# Patient Record
Sex: Male | Born: 1990 | Race: Black or African American | Hispanic: No | Marital: Married | Smoking: Current some day smoker
Health system: Southern US, Community
[De-identification: ages and names within clinical notes are randomized; demographics above are authoritative.]

## PROBLEM LIST (undated history)

## (undated) DIAGNOSIS — I219 Acute myocardial infarction, unspecified: Secondary | ICD-10-CM

---
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
## 2009-02-05 ENCOUNTER — Emergency Department (HOSPITAL_COMMUNITY): Admission: EM | Admit: 2009-02-05 | Discharge: 2009-02-05 | Payer: Self-pay | Admitting: Emergency Medicine

## 2009-08-17 ENCOUNTER — Emergency Department (HOSPITAL_COMMUNITY): Admission: EM | Admit: 2009-08-17 | Discharge: 2009-08-17 | Payer: Self-pay | Admitting: Emergency Medicine

## 2010-11-06 LAB — POCT URINALYSIS DIP (DEVICE)
Bilirubin Urine: NEGATIVE
Glucose, UA: NEGATIVE mg/dL
Hgb urine dipstick: NEGATIVE
Ketones, ur: NEGATIVE mg/dL
Nitrite: NEGATIVE
Protein, ur: 30 mg/dL — AB
Specific Gravity, Urine: 1.02 (ref 1.005–1.030)
Urobilinogen, UA: 1 mg/dL (ref 0.0–1.0)
pH: 7 (ref 5.0–8.0)

## 2010-11-06 LAB — GC/CHLAMYDIA PROBE AMP, GENITAL
Chlamydia, DNA Probe: NEGATIVE
GC Probe Amp, Genital: NEGATIVE

## 2011-02-20 ENCOUNTER — Inpatient Hospital Stay (INDEPENDENT_AMBULATORY_CARE_PROVIDER_SITE_OTHER)
Admission: RE | Admit: 2011-02-20 | Discharge: 2011-02-20 | Disposition: A | Discharge status: Home / Self Care | Payer: Self-pay | Source: Ambulatory Visit | Attending: Emergency Medicine | Admitting: Emergency Medicine

## 2011-02-20 DIAGNOSIS — J039 Acute tonsillitis, unspecified: Secondary | ICD-10-CM

## 2011-02-20 LAB — DIFFERENTIAL
Basophils Absolute: 0.4 10*3/uL — ABNORMAL HIGH (ref 0.0–0.1)
Lymphocytes Relative: 62 % — ABNORMAL HIGH (ref 12–46)
Monocytes Relative: 7 % (ref 3–12)
Neutro Abs: 3.6 10*3/uL (ref 1.7–7.7)
Neutrophils Relative %: 28 % — ABNORMAL LOW (ref 43–77)

## 2011-02-20 LAB — POCT INFECTIOUS MONO SCREEN: Mono Screen: POSITIVE — AB

## 2011-02-20 LAB — CBC
Hemoglobin: 17.2 g/dL — ABNORMAL HIGH (ref 13.0–17.0)
MCH: 28.1 pg (ref 26.0–34.0)
Platelets: 262 10*3/uL (ref 150–400)
RBC: 6.13 MIL/uL — ABNORMAL HIGH (ref 4.22–5.81)
WBC: 13 10*3/uL — ABNORMAL HIGH (ref 4.0–10.5)

## 2011-02-20 LAB — POCT RAPID STREP A: Streptococcus, Group A Screen (Direct): NEGATIVE

## 2011-02-21 LAB — PATHOLOGIST SMEAR REVIEW: Tech Review: REACTIVE

## 2012-03-16 ENCOUNTER — Emergency Department (INDEPENDENT_AMBULATORY_CARE_PROVIDER_SITE_OTHER)
Admission: EM | Admit: 2012-03-16 | Discharge: 2012-03-16 | Disposition: A | Discharge status: Home / Self Care | Payer: Self-pay | Source: Home / Self Care | Attending: Emergency Medicine | Admitting: Emergency Medicine

## 2012-03-16 ENCOUNTER — Encounter (HOSPITAL_COMMUNITY): Payer: Self-pay | Admitting: *Deleted

## 2012-03-16 DIAGNOSIS — M5430 Sciatica, unspecified side: Secondary | ICD-10-CM

## 2012-03-16 LAB — POCT URINALYSIS DIP (DEVICE)
Bilirubin Urine: NEGATIVE
Glucose, UA: NEGATIVE mg/dL
Ketones, ur: NEGATIVE mg/dL
Leukocytes, UA: NEGATIVE
Protein, ur: NEGATIVE mg/dL
Specific Gravity, Urine: 1.02 (ref 1.005–1.030)

## 2012-03-16 MED ORDER — MELOXICAM 15 MG PO TABS: 15.0000 mg | ORAL_TABLET | Freq: Every day | ORAL | Status: AC

## 2012-03-16 MED ORDER — METHOCARBAMOL 500 MG PO TABS: 500.0000 mg | ORAL_TABLET | Freq: Three times a day (TID) | ORAL | Status: AC

## 2012-03-16 MED ORDER — PREDNISONE 5 MG PO KIT: 1.0000 | PACK | Freq: Every day | ORAL | Status: DC

## 2012-03-16 MED ORDER — TRAMADOL HCL 50 MG PO TABS: 100.0000 mg | ORAL_TABLET | Freq: Three times a day (TID) | ORAL | Status: AC | PRN

## 2012-03-16 MED ORDER — KETOROLAC TROMETHAMINE 60 MG/2ML IM SOLN
INTRAMUSCULAR | Status: AC
  Filled 2012-03-16: qty 2

## 2012-03-16 MED ORDER — KETOROLAC TROMETHAMINE 60 MG/2ML IM SOLN
60.0000 mg | Freq: Once | INTRAMUSCULAR | Status: AC
  Administered 2012-03-16: 60 mg via INTRAMUSCULAR

## 2012-03-16 NOTE — ED Provider Notes (Signed)
Chief Complaint  Patient presents with  . Back Pain    History of Present Illness:   Patient is a 21 year old male who has had a history since yesterday at 3 PM of right lower back pain. The pain began while he was just sitting and playing cards. He denies any injury. The pain was initially localized to the right SI area and radiates toward the midline and down the back of the leg as far as the knee but not below the knee. He denies any numbness, tingling, or paresthesias. There is no muscle weakness or difficulty walking. No difficulty with urination or bowel movements. He denies fever, chills, sweats, headache, abdominal pain, or vomiting. He denies a prior history of lower back problems.  Review of Systems:  Other than noted above, the patient denies any of the following symptoms: Systemic:  No fever, chills, fatigue, or weight loss. GI:  No abdominal pain, nausea, vomiting, diarrhea, constipation or blood in stool. GU:  No dysuria, frequency, urgency, or hematuria. No incontinence or difficulty urinating.  M-S:  No neck pain, joint pain, arthritis, or myalgias. Neuro:  No parethesias or muscular weakness. Skin:  No rash or itching.   PMFSH:  Past medical history, family history, social history, meds, and allergies were reviewed.  Physical Exam:   Vital signs:  BP 130/65  Pulse 88  Temp 98 F (36.7 C) (Oral)  Resp 16  SpO2 100% General:  Alert, oriented, in no distress. Abdomen:  Soft, non-tender.  No organomegaly or mass.  No pulsatile midline abdominal mass or bruit. Back:  He localizes the pain to the right SI area however there is no pain to palpation in this area or over the rest of the back. The back is very limited range of motion with 20 of flexion, 10 extension, 10 of lateral bending to the right, 20 lateral bending to the left, and and 20 of rotation in each direction with pain and muscle spasm. Straight leg raising is negative on the left and also negative on the right but  he has a positive Lasegue's sign on the right but negative popliteal compression sign. Neuro:  Normal muscle strength, sensations and DTRs. Extremities: Pedal pulses were full, there was no edema. Skin:  Clear, warm and dry.  No rash.  Labs:   Results for orders placed during the hospital encounter of 03/16/12  POCT URINALYSIS DIP (DEVICE)      Component Value Range   Glucose, UA NEGATIVE  NEGATIVE mg/dL   Bilirubin Urine NEGATIVE  NEGATIVE   Ketones, ur NEGATIVE  NEGATIVE mg/dL   Specific Gravity, Urine 1.020  1.005 - 1.030   Hgb urine dipstick NEGATIVE  NEGATIVE   pH 6.0  5.0 - 8.0   Protein, ur NEGATIVE  NEGATIVE mg/dL   Urobilinogen, UA 1.0  0.0 - 1.0 mg/dL   Nitrite NEGATIVE  NEGATIVE   Leukocytes, UA NEGATIVE  NEGATIVE    Course in Urgent Care Center:   The patient was given Toradol 60 mg IM and tolerated this well without any immediate side effects.  Assessment:  The encounter diagnosis was Sciatica.  Plan:   1.  The following meds were prescribed:   New Prescriptions   MELOXICAM (MOBIC) 15 MG TABLET    Take 1 tablet (15 mg total) by mouth daily.   METHOCARBAMOL (ROBAXIN) 500 MG TABLET    Take 1 tablet (500 mg total) by mouth 3 (three) times daily.   PREDNISONE 5 MG KIT  Take 1 kit (5 mg total) by mouth daily after breakfast. Prednisone 5 mg 6 day dosepack.  Take as directed.   TRAMADOL (ULTRAM) 50 MG TABLET    Take 2 tablets (100 mg total) by mouth every 8 (eight) hours as needed for pain.   2.  The patient was instructed in symptomatic care and handouts were given. 3.  The patient was told to return if becoming worse in any way, if no better in 2 weeks, and given some red flag symptoms that would indicate earlier return. 4.  The patient was encouraged to try to be as active as possible and given some exercises to do followed by moist heat.  Follow up:  The patient was told to follow up with Dr. August Saucer if no better in 2 weeks.     Reuben Likes, MD 03/16/12 2012

## 2012-03-16 NOTE — ED Notes (Signed)
Co lower mid back pain since yesterday, denies recent injury or urinary symptoms.

## 2013-08-12 ENCOUNTER — Emergency Department (INDEPENDENT_AMBULATORY_CARE_PROVIDER_SITE_OTHER)
Admission: EM | Admit: 2013-08-12 | Discharge: 2013-08-12 | Disposition: A | Discharge status: Home / Self Care | Payer: Self-pay | Source: Home / Self Care | Attending: Family Medicine | Admitting: Family Medicine

## 2013-08-12 ENCOUNTER — Encounter (HOSPITAL_COMMUNITY): Payer: Self-pay | Admitting: Emergency Medicine

## 2013-08-12 DIAGNOSIS — J020 Streptococcal pharyngitis: Secondary | ICD-10-CM

## 2013-08-12 LAB — POCT RAPID STREP A: Streptococcus, Group A Screen (Direct): POSITIVE — AB

## 2013-08-12 MED ORDER — KETOROLAC TROMETHAMINE 60 MG/2ML IM SOLN
INTRAMUSCULAR | Status: AC
  Filled 2013-08-12: qty 2

## 2013-08-12 MED ORDER — PENICILLIN G BENZATHINE 1200000 UNIT/2ML IM SUSP
INTRAMUSCULAR | Status: AC
  Filled 2013-08-12: qty 2

## 2013-08-12 MED ORDER — PENICILLIN G BENZATHINE 1200000 UNIT/2ML IM SUSP
1.2000 10*6.[IU] | Freq: Once | INTRAMUSCULAR | Status: AC
  Administered 2013-08-12: 1.2 10*6.[IU] via INTRAMUSCULAR

## 2013-08-12 MED ORDER — AMOXICILLIN 875 MG PO TABS: 875.0000 mg | ORAL_TABLET | Freq: Two times a day (BID) | ORAL | Status: DC

## 2013-08-12 MED ORDER — KETOROLAC TROMETHAMINE 60 MG/2ML IM SOLN
60.0000 mg | Freq: Once | INTRAMUSCULAR | Status: AC
  Administered 2013-08-12: 60 mg via INTRAMUSCULAR

## 2013-08-12 MED ORDER — PREDNISONE 10 MG PO TABS: ORAL_TABLET | ORAL | Status: DC

## 2013-08-12 MED ORDER — HYDROCODONE-ACETAMINOPHEN 5-325 MG PO TABS
1.0000 | ORAL_TABLET | ORAL | Status: DC | PRN
Start: 2013-08-12 — End: 2015-03-12

## 2013-08-12 NOTE — ED Provider Notes (Signed)
Medical screening examination/treatment/procedure(s) were performed by resident physician or non-physician practitioner and as supervising physician I was immediately available for consultation/collaboration.   Torri Langston DOUGLAS MD.   Jalan Fariss D Aveline Daus, MD 08/12/13 1559 

## 2013-08-12 NOTE — Discharge Instructions (Signed)
Strep Throat  Strep throat is an infection of the throat caused by a bacteria named Streptococcus pyogenes. Your caregiver may call the infection streptococcal "tonsillitis" or "pharyngitis" depending on whether there are signs of inflammation in the tonsils or back of the throat. Strep throat is most common in children aged 24 15 years during the cold months of the year, but it can occur in people of any age during any season. This infection is spread from person to person (contagious) through coughing, sneezing, or other close contact.  SYMPTOMS   · Fever or chills.  · Painful, swollen, red tonsils or throat.  · Pain or difficulty when swallowing.  · White or yellow spots on the tonsils or throat.  · Swollen, tender lymph nodes or "glands" of the neck or under the jaw.  · Red rash all over the body (rare).  DIAGNOSIS   Many different infections can cause the same symptoms. A test must be done to confirm the diagnosis so the right treatment can be given. A "rapid strep test" can help your caregiver make the diagnosis in a few minutes. If this test is not available, a light swab of the infected area can be used for a throat culture test. If a throat culture test is done, results are usually available in a day or two.  TREATMENT   Strep throat is treated with antibiotic medicine.  HOME CARE INSTRUCTIONS   · Gargle with 1 tsp of salt in 1 cup of warm water, 3 4 times per day or as needed for comfort.  · Family members who also have a sore throat or fever should be tested for strep throat and treated with antibiotics if they have the strep infection.  · Make sure everyone in your household washes their hands well.  · Do not share food, drinking cups, or personal items that could cause the infection to spread to others.  · You may need to eat a soft food diet until your sore throat gets better.  · Drink enough water and fluids to keep your urine clear or pale yellow. This will help prevent dehydration.  · Get plenty of  rest.  · Stay home from school, daycare, or work until you have been on antibiotics for 24 hours.  · Only take over-the-counter or prescription medicines for pain, discomfort, or fever as directed by your caregiver.  · If antibiotics are prescribed, take them as directed. Finish them even if you start to feel better.  SEEK MEDICAL CARE IF:   · The glands in your neck continue to enlarge.  · You develop a rash, cough, or earache.  · You cough up green, yellow-brown, or bloody sputum.  · You have pain or discomfort not controlled by medicines.  · Your problems seem to be getting worse rather than better.  SEEK IMMEDIATE MEDICAL CARE IF:   · You develop any new symptoms such as vomiting, severe headache, stiff or painful neck, chest pain, shortness of breath, or trouble swallowing.  · You develop severe throat pain, drooling, or changes in your voice.  · You develop swelling of the neck, or the skin on the neck becomes red and tender.  · You have a fever.  · You develop signs of dehydration, such as fatigue, dry mouth, and decreased urination.  · You become increasingly sleepy, or you cannot wake up completely.  Document Released: 07/14/2000 Document Revised: 07/03/2012 Document Reviewed: 09/15/2010  [employer]® Patient Information ©2014 [employer].

## 2013-08-12 NOTE — ED Provider Notes (Signed)
CSN: [id number]     Arrival date & time 08/12/13  1233 History   First MD Initiated Contact with Patient 08/12/13 1346     Chief Complaint  Patient presents with  . Fever   (Consider location/radiation/quality/duration/timing/severity/associated sxs/prior Treatment) HPI Comments: 23 year old male presents complaining of 3 days of fever, sore throat, diarrhea, body aches, fatigue. The sore throat is the most significant symptom and has been gradually worsening. He also feels like his glands are swollen in his throat. No recent travel or sick contacts. OTC medications are not helping.  Patient is a 23 y.o. male presenting with fever.  Fever Associated symptoms: cough, diarrhea, myalgias and sore throat   Associated symptoms: no chest pain, no chills, no congestion, no dysuria, no nausea, no rash, no rhinorrhea and no vomiting     History reviewed. No pertinent past medical history. History reviewed. No pertinent past surgical history. History reviewed. No pertinent family history. History  Substance Use Topics  . Smoking status: Current Every Day Smoker -- 1.00 packs/day  . Smokeless tobacco: Not on file  . Alcohol Use: Yes    Review of Systems  Constitutional: Positive for fever. Negative for chills and fatigue.  HENT: Positive for sore throat. Negative for congestion and rhinorrhea.   Eyes: Negative for visual disturbance.  Respiratory: Positive for cough. Negative for shortness of breath.   Cardiovascular: Negative for chest pain, palpitations and leg swelling.  Gastrointestinal: Positive for diarrhea. Negative for nausea, vomiting, abdominal pain and constipation.  Genitourinary: Negative for dysuria, urgency, frequency and hematuria.  Musculoskeletal: Positive for myalgias. Negative for arthralgias, neck pain and neck stiffness.  Skin: Negative for rash.  Neurological: Negative for dizziness, weakness and light-headedness.    Allergies  Review of patient's allergies  indicates no known allergies.  Home Medications   Current Outpatient Rx  Name  Route  Sig  Dispense  Refill  . amoxicillin (AMOXIL) 875 MG tablet   Oral   Take 1 tablet (875 mg total) by mouth 2 (two) times daily.   14 tablet   0   . HYDROcodone-acetaminophen (NORCO) 5-325 MG per tablet   Oral   Take 1-2 tablets by mouth every 4 (four) hours as needed for moderate pain.   20 tablet   0   . predniSONE (DELTASONE) 10 MG tablet      4 tabs PO QD for 4 days;  3 tabs PO QD for 3 days;  2 tabs PO QD for 2 days;  1 tab PO QD for 1 day   30 tablet   0   . PredniSONE 5 MG KIT   Oral   Take 1 kit (5 mg total) by mouth daily after breakfast. Prednisone 5 mg 6 day dosepack.  Take as directed.   1 kit   0    BP 116/73  Pulse 105  Temp(Src) 100.1 F (37.8 C) (Oral)  Resp 20  SpO2 100% Physical Exam  Nursing note and vitals reviewed. Constitutional: He is oriented to person, place, and time. He appears well-developed and well-nourished. No distress.  HENT:  Head: Normocephalic and atraumatic.  Right Ear: External ear normal.  Left Ear: External ear normal.  Nose: Nose normal.  Mouth/Throat: Oropharyngeal exudate and posterior oropharyngeal erythema present. No tonsillar abscesses.  Eyes: Conjunctivae are normal. Right eye exhibits no discharge. Left eye exhibits no discharge.  Neck: Normal range of motion. Neck supple.  Cardiovascular: Regular rhythm and normal heart sounds.  Tachycardia present.  Exam reveals no gallop  and no friction rub.   No murmur heard. Pulmonary/Chest: Effort normal and breath sounds normal. No respiratory distress.  Lymphadenopathy:    He has cervical adenopathy (tonsillar,posterior cervical).  Neurological: He is alert and oriented to person, place, and time. Coordination normal.  Skin: Skin is warm and dry. No rash noted. He is not diaphoretic.  Psychiatric: He has a normal mood and affect. Judgment normal.    ED Course  Procedures (including  critical care time) Labs Review Labs Reviewed  POCT RAPID STREP A ([facility] ONLY) - Abnormal; Notable for the following:    Streptococcus, Group A Screen (Direct) POSITIVE (*)    All other components within normal limits   Imaging Review No results found.    MDM   1. Strep pharyngitis    Given Toradol and penicillin here, will discharge with amoxicillin, prednisone, Norco. Followup if worsening.   Meds ordered this encounter  Medications  . penicillin g benzathine (BICILLIN LA) 1200000 UNIT/2ML injection 1.2 Million Units    Sig:   . predniSONE (DELTASONE) 10 MG tablet    Sig: 4 tabs PO QD for 4 days;  3 tabs PO QD for 3 days;  2 tabs PO QD for 2 days;  1 tab PO QD for 1 day    Dispense:  30 tablet    Refill:  0    Order Specific Question:  Supervising Provider    Answer:  Billy Fischer [phone]  . amoxicillin (AMOXIL) 875 MG tablet    Sig: Take 1 tablet (875 mg total) by mouth 2 (two) times daily.    Dispense:  14 tablet    Refill:  0    Order Specific Question:  Supervising Provider    Answer:  Billy Fischer [phone]  . HYDROcodone-acetaminophen (NORCO) 5-325 MG per tablet    Sig: Take 1-2 tablets by mouth every 4 (four) hours as needed for moderate pain.    Dispense:  20 tablet    Refill:  0    Order Specific Question:  Supervising Provider    Answer:  Billy Fischer [phone]  . ketorolac (TORADOL) injection 60 mg    Sig:        Liam Graham, PA-C 08/12/13 1416

## 2013-08-12 NOTE — ED Notes (Signed)
pT  HAS  SYMPTOMS  OF  FEVER  BODY  ACHES      SORETHROAT   COUGH  AND  DIARRHEA  X  SEVERAL  DAYS            NO  VOMITING

## 2014-02-17 ENCOUNTER — Encounter (HOSPITAL_BASED_OUTPATIENT_CLINIC_OR_DEPARTMENT_OTHER): Payer: Self-pay | Admitting: Emergency Medicine

## 2014-02-17 ENCOUNTER — Emergency Department (HOSPITAL_BASED_OUTPATIENT_CLINIC_OR_DEPARTMENT_OTHER)
Admission: EM | Admit: 2014-02-17 | Discharge: 2014-02-17 | Disposition: A | Discharge status: Home / Self Care | Payer: Self-pay | Attending: Emergency Medicine | Admitting: Emergency Medicine

## 2014-02-17 DIAGNOSIS — M5430 Sciatica, unspecified side: Secondary | ICD-10-CM | POA: Insufficient documentation

## 2014-02-17 DIAGNOSIS — IMO0002 Reserved for concepts with insufficient information to code with codable children: Secondary | ICD-10-CM | POA: Insufficient documentation

## 2014-02-17 DIAGNOSIS — M5431 Sciatica, right side: Secondary | ICD-10-CM

## 2014-02-17 DIAGNOSIS — Z792 Long term (current) use of antibiotics: Secondary | ICD-10-CM | POA: Insufficient documentation

## 2014-02-17 DIAGNOSIS — F17200 Nicotine dependence, unspecified, uncomplicated: Secondary | ICD-10-CM | POA: Insufficient documentation

## 2014-02-17 MED ORDER — TRAMADOL HCL 50 MG PO TABS: 50.0000 mg | ORAL_TABLET | Freq: Four times a day (QID) | ORAL | Status: DC | PRN

## 2014-02-17 MED ORDER — CYCLOBENZAPRINE HCL 5 MG PO TABS: 5.0000 mg | ORAL_TABLET | Freq: Three times a day (TID) | ORAL | Status: DC | PRN

## 2014-02-17 MED ORDER — MELOXICAM 7.5 MG PO TABS: 7.5000 mg | ORAL_TABLET | Freq: Every day | ORAL | Status: DC

## 2014-02-17 MED ORDER — IBUPROFEN 800 MG PO TABS
800.0000 mg | ORAL_TABLET | Freq: Once | ORAL | Status: AC
  Administered 2014-02-17: 800 mg via ORAL
  Filled 2014-02-17: qty 1

## 2014-02-17 NOTE — Discharge Instructions (Signed)
Please follow-up with a Primary care physician regarding continued management of your symptoms. A list of providers has been provided. You will be discharged with medication to help with your symptoms.    Emergency Department Resource Guide 1) Find a Doctor and Pay Out of Pocket Although you won't have to find out who is covered by your insurance plan, it is a good idea to ask around and get recommendations. You will then need to call the office and see if the doctor you have chosen will accept you as a new patient and what types of options they offer for patients who are self-pay. Some doctors offer discounts or will set up payment plans for their patients who do not have insurance, but you will need to ask so you aren't surprised when you get to your appointment.  2) Contact Your Local Health Department Not all health departments have doctors that can see patients for sick visits, but many do, so it is worth a call to see if yours does. If you don't know where your local health department is, you can check in your phone book. The CDC also has a tool to help you locate your state's health department, and many state websites also have listings of all of their local health departments.  3) Find a Walk-in Clinic If your illness is not likely to be very severe or complicated, you may want to try a walk in clinic. These are popping up all over the country in pharmacies, drugstores, and shopping centers. They're usually staffed by nurse practitioners or physician assistants that have been trained to treat common illnesses and complaints. They're usually fairly quick and inexpensive. However, if you have serious medical issues or chronic medical problems, these are probably not your best option.  No Primary Care Doctor: - Call Health Connect at  [phone] - they can help you locate a primary care doctor that  accepts your insurance, provides certain services, etc. - Physician Referral Service-  [phone]  Chronic Pain Problems: Organization         Address  Phone   Notes  [facility]  [phone] Patients need to be referred by their primary care doctor.   Medication Assistance: Organization         Address  Phone   Notes  [facility] [address] [place], [place] [address] [phone]) [phone] --Must be a resident of [place] -- Must have NO insurance coverage whatsoever (no Medicaid/ Medicare, etc.) -- The pt. MUST have a primary care doctor that directs their care regularly and follows them in the community   MedAssist  [phone]   [employer]  [phone]    Agencies that provide inexpensive medical care: Organization         Address  Phone   Notes  [facility]  [phone]) [phone]   [facility]    [phone]) [phone]   [facility] [address] [place], [place] [address] [phone]) [phone]   [facility] [address], [place] [phone]) [phone]   [facility]anned Parenthood    [phone]) [phone]   [facility]    [phone]   [facility]  [address], [place] Phone:  [phone]) [phone], Fax:  [phone]) [phone] Hours of Operation:  9 am - 6 pm, M-F.  Also accepts Medicaid/Medicare and self-pay.  [facility]  [address], [address], [place] Phone: [phone], Fax: [phone]. Hours of Operation:  8:30 am - 5:30 pm, M-F.  Also accepts Medicaid and self-pay.  [facility] [address], [place] Phone: [phone]   [place], [place], [place] [phone], Ext. [phone] Mondays & Thursdays: 7-9 AM.  First 15 patients are seen on a first come, first serve basis.    [place] Providers:  Organization         Address  Phone   Notes  [facility] [address],  [place] [phone] Also accepts self-pay patients.  [facility] [address], [place]  [phone]   [place], [address], [place] [phone]   [facility] [address] [place] [phone]   Lucianne Lei [address], [place]   [phone] Only accepts [place] Access [place] patients after they have their name applied to their card.   Self-Pay (no insurance) in [facility]:  Organization         Address  Phone   Notes  Sickle Cell Patients, [facility] [place] [phone]   [facility] [place] [phone]   [facility]  [place], [place], [place] [phone]   [facility]/Dr. Osei-Bonsu  [address], [place] or [address], [place] [phone] Phone number for both [place] and [place] locations is the same.  [facility] [address], [place] [phone]   [facility] [address] [place] or [address] [phone]   [facility] [address] [place] [phone], phone; [phone], fax Sees patients 1st and 3rd Saturday of every month.  Must not qualify for public or private insurance (i.e. Medicaid, Medicare, [place] Health Choice, Veterans' Benefits)  Household income should be no more than 200% of the poverty level The clinic cannot treat you if you are pregnant or think you are pregnant  Sexually transmitted diseases are not treated at the clinic.    Dental Care: Organization         Address  Phone  Notes  [facility] Department of [facility] [place] [phone] Accepts children up to age 50 who are enrolled in  [place] or [place]; pregnant women with a Medicaid card; and children who have applied for Medicaid or [place] Health Choice, but were declined, whose parents can pay a reduced fee at time of service.  [facility] Department of [facility]  [address], [place] [phone] Accepts children up to age 26 who are enrolled in [place] or [place]; pregnant women with a Medicaid card; and children who have applied for Medicaid or [place] Health Choice, but were declined, whose parents can pay a reduced fee at time of service.  [place] [facility] Access PROGRAM  [place] [phone] Patients are seen by appointment only. Walk-ins are not accepted. [place] will see patients 70 years of age and older. Monday - Tuesday (8am-5pm) Most Wednesdays (8:30-5pm) $30 per visit, cash only  [place] Adult  Dental Access PROGRAM  [address], [facility] [phone] Patients are seen by appointment only. Walk-ins are not accepted. [place] will see patients 42 years of age and older. One Wednesday Evening (Monthly: Volunteer Based).  $30 per visit, cash only  [place]  [phone] for adults; Children under age 54, call Graduate Pediatric Dentistry at [phone]. Children aged 65-14, please call [phone] to request a pediatric application.  Dental services are provided in all areas of dental care including fillings, crowns and bridges, complete and partial dentures, implants, gum treatment, root canals, and extractions. Preventive care is also provided. Treatment is provided to both adults and children. Patients are selected via a lottery and there is often a waiting list.   [facility] [address] [place]  [phone] [email or web address]   [facility] [address] [place], [place] [phone], Ext. [phone] Second and Fourth Thursday of each month, opens at 6:30  AM; Clinic ends at 9 AM.  Patients are seen on a first-come first-served basis, and a limited number are seen during each clinic.   [facility]  [address] Hillard Danker [place], [place] [phone]   Eligibility Requirements You must have lived in [place], [place], or [place] counties for at least the last three months.   You cannot be eligible for state or federal sponsored [employer], including [employer], [place], or [employer].   You generally cannot be eligible for healthcare insurance through your employer.    How to apply: Eligibility screenings are held every Tuesday and Wednesday afternoon from 1:00 pm until 4:00 pm. You do not need an appointment for the interview!  [facility] [address] [place], [place]   [place]  [place] Department  [place]  [phone]    Behavioral Health Resources in the Community: Intensive Outpatient Programs Organization         Address  Phone  Notes  [place]. [address], [place], [place] [phone]   [facility] Outpatient [address], [place], [place]   ADS: Alcohol & Drug Svcs [address] [place], [place]   [place] [address]  [place], [place] or [phone]   Substance Abuse Resources Organization         Address  Phone  Notes  Alcohol and Drug Services  [phone]   [place]  [phone]   The [place]   Chinita Pester  [phone]   Residential & Outpatient Substance Abuse Program  [phone]   Psychological Services Organization         Address  Phone  Notes  [facility] [place]  [place]  [phone]   [place] [address], [place] [phone] or  [phone]    Mobile Crisis Teams Organization         Address  Phone  Notes  [facility], Mobile Crisis Care Unit  [phone]   [facility]  [address] [place], [place]   [place] Levels [address] [place] [phone]    Self-Help/Support Groups Organization         Address  Phone             Notes  Mental  [facility] of [place] - variety of support groups  [place] Call for more information  Narcotics Anonymous (NA), [facility]aring [facility] [address], [employer] [place]  2 meetings at this location   Special educational needs teacher         Address  Phone  Notes  [facility] Residential Treatment [place],    [place]  [phone]   [facility]  [address] [place] [address], [place], [place]   [place]ellie [place], [place] [phone] Admissions: 8am-3pm M-F  Incentives Substance [place] [address]    [place], [place] [phone]   [facility] [address] [place], [place], [place]   [facility] [address],  [place], [place]   Insight Programs - Intensive Outpatient [address] Kristeen [address], [place], [place]   [facility] [address].,  [place], [place] [phone] or [phone]   [facility] Services (RTS) [address], [place], [place] Accepts Medicaid  [facility] [address]  [place] [phone] Substance Abuse/Addiction Treatment   [facility] Organization         Address  Phone  Notes  [facility]  [phone]   Domenic Schwab, PhD [address] Arlis Porta [place], [place]   [phone] or [phone]   [facility], [place] [phone]   [facility] [address],  [place], [place] [phone] Insurance/Medicaid/sponsorship through [facility] and Families [address], [address]                                    [place], [place] [phone] [place] [address], [place] [phone]    Dr. Adele Schilder  [phone]   [facility] Dept. 1) [address] [place] 2) [place] 3)  [place] [address], [place] [phone]  [phone]   [place] [phone] or [phone] (After Hours)

## 2014-02-17 NOTE — ED Notes (Signed)
Pt. Reports he has R leg pain from the ankle to the knee.  Pt. In no distress and able to walk.  Pt. Reports past history of sciatic nerve pain.

## 2014-02-17 NOTE — ED Provider Notes (Signed)
CSN: [id number]     Arrival date & time 02/17/14  2008 History   First MD Initiated Contact with Patient 02/17/14 2105     Chief Complaint  Patient presents with  . Leg Pain     (Consider location/radiation/quality/duration/timing/severity/associated sxs/prior Treatment) Patient is a 23 y.o. male presenting with ankle pain.  Ankle Pain Location:  Ankle Injury: no   Ankle location:  R ankle Pain details:    Quality:  Sharp   Radiates to:  R leg   Severity:  Mild   Onset quality:  Sudden   Duration:  4 days   Timing:  Intermittent   Progression:  Waxing and waning Chronicity:  Recurrent Dislocation: no   Prior injury to area:  No Worsened by:  Nothing tried Associated symptoms: no fever, no numbness, no swelling and no tingling   Patient has similar symptoms 2 years ago. Was treated with Mobic, Tramadol and Robaxin which helped. Was told to follow-up with orthopedic surgery but did not at the time.  History reviewed. No pertinent past medical history. History reviewed. No pertinent past surgical history. No family history on file. History  Substance Use Topics  . Smoking status: Current Every Day Smoker -- 1.00 packs/day  . Smokeless tobacco: Not on file  . Alcohol Use: Yes    Review of Systems  Constitutional: Negative for fever.  Musculoskeletal: Negative for gait problem and joint swelling.      Allergies  Review of patient's allergies indicates no known allergies.  Home Medications   Prior to Admission medications   Medication Sig Start Date End Date Taking? Authorizing Provider  amoxicillin (AMOXIL) 875 MG tablet Take 1 tablet (875 mg total) by mouth 2 (two) times daily. 08/12/13   Freeman Caldron Baker, PA-C  cyclobenzaprine (FLEXERIL) 5 MG tablet Take 1 tablet (5 mg total) by mouth 3 (three) times daily as needed for muscle spasms. 02/17/14   Cordelia Poche, MD  HYDROcodone-acetaminophen (NORCO) 5-325 MG per tablet Take 1-2 tablets by mouth every 4 (four) hours as  needed for moderate pain. 08/12/13   Liam Graham, PA-C  meloxicam (MOBIC) 7.5 MG tablet Take 1 tablet (7.5 mg total) by mouth daily. 02/17/14   Cordelia Poche, MD  predniSONE (DELTASONE) 10 MG tablet 4 tabs PO QD for 4 days;  3 tabs PO QD for 3 days;  2 tabs PO QD for 2 days;  1 tab PO QD for 1 day 08/12/13   Liam Graham, PA-C  PredniSONE 5 MG KIT Take 1 kit (5 mg total) by mouth daily after breakfast. Prednisone 5 mg 6 day dosepack.  Take as directed. 03/16/12   Harden Mo, MD  traMADol (ULTRAM) 50 MG tablet Take 1 tablet (50 mg total) by mouth every 6 (six) hours as needed. 02/17/14   Cordelia Poche, MD   BP 136/71  Pulse 81  Temp(Src) 98.5 F (36.9 C) (Oral)  Resp 18  Ht 6' 3"  (1.905 m)  Wt 298 lb (135.172 kg)  BMI 37.25 kg/m2  SpO2 99% Physical Exam  Constitutional: He is oriented to person, place, and time.  Musculoskeletal:       Right knee: He exhibits normal range of motion, no swelling, no effusion, no deformity and no bony tenderness. No tenderness found.       Right ankle: He exhibits normal range of motion, no swelling, no deformity and no laceration. No tenderness. No lateral malleolus and no medial malleolus tenderness found.  Lumbar back: He exhibits normal range of motion, no tenderness, no bony tenderness and no swelling.  Neurological: He is alert and oriented to person, place, and time. No cranial nerve deficit or sensory deficit.  Reflex Scores:      Patellar reflexes are 2+ on the right side and 2+ on the left side. 4/5 strength on right lower extremity compared to 5/5 on left    ED Course  Procedures (including critical care time) Medications  ibuprofen (ADVIL,MOTRIN) tablet 800 mg (800 mg Oral Given 02/17/14 2147)   Labs Review Labs Reviewed - No data to display  Imaging Review No results found.   EKG Interpretation None      MDM   Final diagnoses:  Sciatica, right   Patient with recurrent symptoms of previous diagnosis of sciatica. May  need imaging and follow-up with specialist depending on what is causing symptoms. Patient will be obtaining insurance next month and will find a PCP at that time. Given resources for PCPs in the area that provide easier access for uninsured. Given prescription for Mobic, Robaxin and Tramadol. Patient understood and agreed with plan. Stable for discharge home.   Cordelia Poche, MD 02/18/14 1430

## 2014-02-17 NOTE — ED Notes (Signed)
Pt was in no active distress walking from triage to room, deferred wheelchair, pt with stable steady gait

## 2014-02-18 NOTE — ED Provider Notes (Signed)
I saw and evaluated the patient, reviewed the resident's note and I agree with the findings and plan.   EKG Interpretation None        Dagmar HaitWilliam Remijio Holleran, MD 02/18/14 1535

## 2015-02-26 ENCOUNTER — Emergency Department (HOSPITAL_COMMUNITY)
Admission: EM | Admit: 2015-02-26 | Discharge: 2015-02-26 | Disposition: A | Discharge status: Home / Self Care | Payer: Self-pay | Attending: Emergency Medicine | Admitting: Emergency Medicine

## 2015-02-26 ENCOUNTER — Encounter (HOSPITAL_COMMUNITY): Payer: Self-pay | Admitting: *Deleted

## 2015-02-26 DIAGNOSIS — Z792 Long term (current) use of antibiotics: Secondary | ICD-10-CM | POA: Insufficient documentation

## 2015-02-26 DIAGNOSIS — Z720 Tobacco use: Secondary | ICD-10-CM | POA: Insufficient documentation

## 2015-02-26 DIAGNOSIS — R109 Unspecified abdominal pain: Secondary | ICD-10-CM | POA: Insufficient documentation

## 2015-02-26 DIAGNOSIS — Z7952 Long term (current) use of systemic steroids: Secondary | ICD-10-CM | POA: Insufficient documentation

## 2015-02-26 DIAGNOSIS — R112 Nausea with vomiting, unspecified: Secondary | ICD-10-CM | POA: Insufficient documentation

## 2015-02-26 DIAGNOSIS — Z791 Long term (current) use of non-steroidal anti-inflammatories (NSAID): Secondary | ICD-10-CM | POA: Insufficient documentation

## 2015-02-26 LAB — COMPREHENSIVE METABOLIC PANEL
ALBUMIN: 3.8 g/dL (ref 3.5–5.0)
ALT: 40 U/L (ref 17–63)
ANION GAP: 11 (ref 5–15)
AST: 36 U/L (ref 15–41)
Alkaline Phosphatase: 63 U/L (ref 38–126)
CALCIUM: 9.2 mg/dL (ref 8.9–10.3)
CHLORIDE: 103 mmol/L (ref 101–111)
CO2: 22 mmol/L (ref 22–32)
CREATININE: 0.82 mg/dL (ref 0.61–1.24)
GFR calc Af Amer: >60 mL/min (ref >60)
GFR calc non Af Amer: >60 mL/min (ref >60)
Glucose, Bld: 146 mg/dL — ABNORMAL HIGH (ref 65–99)
Potassium: 2.8 mmol/L — ABNORMAL LOW (ref 3.5–5.1)
Sodium: 136 mmol/L (ref 135–145)
TOTAL PROTEIN: 7.2 g/dL (ref 6.5–8.1)
Total Bilirubin: 0.6 mg/dL (ref 0.3–1.2)

## 2015-02-26 LAB — URINALYSIS, ROUTINE W REFLEX MICROSCOPIC
Bilirubin Urine: NEGATIVE
Glucose, UA: NEGATIVE mg/dL
Hgb urine dipstick: NEGATIVE
Ketones, ur: 15 mg/dL — AB
LEUKOCYTES UA: NEGATIVE
Nitrite: NEGATIVE
PH: 5.5 (ref 5.0–8.0)
Protein, ur: NEGATIVE mg/dL
Specific Gravity, Urine: 1.022 (ref 1.005–1.030)
Urobilinogen, UA: 0.2 mg/dL (ref 0.0–1.0)

## 2015-02-26 LAB — CBC
HEMATOCRIT: 44.2 % (ref 39.0–52.0)
Hemoglobin: 14.4 g/dL (ref 13.0–17.0)
MCH: 26.1 pg (ref 26.0–34.0)
MCHC: 32.6 g/dL (ref 30.0–36.0)
MCV: 80.1 fL (ref 78.0–100.0)
Platelets: 346 10*3/uL (ref 150–400)
RBC: 5.52 MIL/uL (ref 4.22–5.81)
RDW: 15.7 % — ABNORMAL HIGH (ref 11.5–15.5)
WBC: 6.8 10*3/uL (ref 4.0–10.5)

## 2015-02-26 LAB — LIPASE, BLOOD: LIPASE: 19 U/L — AB (ref 22–51)

## 2015-02-26 MED ORDER — POTASSIUM CHLORIDE ER 10 MEQ PO TBCR: 10.0000 meq | EXTENDED_RELEASE_TABLET | Freq: Every day | ORAL | Status: DC

## 2015-02-26 MED ORDER — POTASSIUM CHLORIDE CRYS ER 20 MEQ PO TBCR
40.0000 meq | EXTENDED_RELEASE_TABLET | Freq: Once | ORAL | Status: AC
  Administered 2015-02-26: 40 meq via ORAL
  Filled 2015-02-26: qty 2

## 2015-02-26 MED ORDER — ONDANSETRON HCL 4 MG/2ML IJ SOLN
4.0000 mg | INTRAMUSCULAR | Status: AC
  Administered 2015-02-26: 4 mg via INTRAVENOUS
  Filled 2015-02-26: qty 2

## 2015-02-26 MED ORDER — SODIUM CHLORIDE 0.9 % IV BOLUS (SEPSIS)
1000.0000 mL | Freq: Once | INTRAVENOUS | Status: AC
  Administered 2015-02-26: 1000 mL via INTRAVENOUS

## 2015-02-26 MED ORDER — OXYCODONE-ACETAMINOPHEN 5-325 MG PO TABS
1.0000 | ORAL_TABLET | Freq: Once | ORAL | Status: AC
  Administered 2015-02-26: 1 via ORAL
  Filled 2015-02-26: qty 1

## 2015-02-26 MED ORDER — DICYCLOMINE HCL 10 MG PO CAPS
10.0000 mg | ORAL_CAPSULE | Freq: Once | ORAL | Status: AC
  Administered 2015-02-26: 10 mg via ORAL
  Filled 2015-02-26: qty 1

## 2015-02-26 MED ORDER — ONDANSETRON 4 MG PO TBDP: 4.0000 mg | ORAL_TABLET | Freq: Three times a day (TID) | ORAL | Status: DC | PRN

## 2015-02-26 MED ORDER — ONDANSETRON HCL 4 MG/2ML IJ SOLN
4.0000 mg | Freq: Once | INTRAMUSCULAR | Status: AC
  Administered 2015-02-26: 4 mg via INTRAVENOUS
  Filled 2015-02-26: qty 2

## 2015-02-26 MED ORDER — POLYETHYLENE GLYCOL 3350 17 GM/SCOOP PO POWD: 17.0000 g | Freq: Two times a day (BID) | ORAL | Status: DC

## 2015-02-26 MED ORDER — ONDANSETRON 4 MG PO TBDP
4.0000 mg | ORAL_TABLET | Freq: Once | ORAL | Status: AC | PRN
  Administered 2015-02-26: 4 mg via ORAL
  Filled 2015-02-26: qty 1

## 2015-02-26 NOTE — ED Provider Notes (Signed)
CSN: [id number]     Arrival date & time 02/26/15  1746 History   First MD Initiated Contact with Patient 02/26/15 1934     Chief Complaint  Patient presents with  . Abdominal Pain   [name] is a 24 y.o. male who is a smoker who presents to the ED complaining of nausea and vomiting starting last night which progressed to later also having left-sided abdominal cramping. Patient complains of 7 out of 10 left-sided abdominal cramping currently. Patient reports vomiting approximately 10 times today. Patient denies previous abdominal surgeries. Patient reports his last bowel movement was 2 days ago. He denies diarrhea. Patient denies fevers, chills, cough, wheezing, chest pain, shortness breath, hematochezia, or marijuana use.  (Consider location/radiation/quality/duration/timing/severity/associated sxs/prior Treatment) HPI  History reviewed. No pertinent past medical history. History reviewed. No pertinent past surgical history. No family history on file. History  Substance Use Topics  . Smoking status: Current Every Day Smoker -- 1.00 packs/day  . Smokeless tobacco: Not on file  . Alcohol Use: Yes    Review of Systems  Constitutional: Negative for fever and chills.  HENT: Negative for congestion and sore throat.   Eyes: Negative for visual disturbance.  Respiratory: Negative for cough, shortness of breath and wheezing.   Cardiovascular: Negative for chest pain.  Gastrointestinal: Positive for nausea, vomiting and abdominal pain. Negative for diarrhea and blood in stool.  Genitourinary: Negative for dysuria, urgency, frequency, hematuria and difficulty urinating.  Musculoskeletal: Negative for back pain and neck pain.  Skin: Negative for rash.  Neurological: Negative for dizziness, weakness, light-headedness, numbness and headaches.      Allergies  Review of patient's allergies indicates no known allergies.  Home Medications   Prior to Admission medications    Medication Sig Start Date End Date Taking? Authorizing Provider  amoxicillin (AMOXIL) 875 MG tablet Take 1 tablet (875 mg total) by mouth 2 (two) times daily. 08/12/13   Freeman Caldron Baker, PA-C  cyclobenzaprine (FLEXERIL) 5 MG tablet Take 1 tablet (5 mg total) by mouth 3 (three) times daily as needed for muscle spasms. 02/17/14   Mariel Aloe, MD  HYDROcodone-acetaminophen (NORCO) 5-325 MG per tablet Take 1-2 tablets by mouth every 4 (four) hours as needed for moderate pain. 08/12/13   Liam Graham, PA-C  meloxicam (MOBIC) 7.5 MG tablet Take 1 tablet (7.5 mg total) by mouth daily. 02/17/14   Mariel Aloe, MD  ondansetron (ZOFRAN ODT) 4 MG disintegrating tablet Take 1 tablet (4 mg total) by mouth every 8 (eight) hours as needed for nausea or vomiting. 02/26/15   Waynetta Pean, PA-C  polyethylene glycol powder (GLYCOLAX/MIRALAX) powder Take 17 g by mouth 2 (two) times daily. Until daily soft stools  OTC 02/26/15   Waynetta Pean, PA-C  potassium chloride (K-DUR) 10 MEQ tablet Take 1 tablet (10 mEq total) by mouth daily. 02/26/15   Waynetta Pean, PA-C  predniSONE (DELTASONE) 10 MG tablet 4 tabs PO QD for 4 days;  3 tabs PO QD for 3 days;  2 tabs PO QD for 2 days;  1 tab PO QD for 1 day 08/12/13   Liam Graham, PA-C  PredniSONE 5 MG KIT Take 1 kit (5 mg total) by mouth daily after breakfast. Prednisone 5 mg 6 day dosepack.  Take as directed. 03/16/12   Harden Mo, MD  traMADol (ULTRAM) 50 MG tablet Take 1 tablet (50 mg total) by mouth every 6 (six) hours as needed. 02/17/14   Mariel Aloe, MD  BP 118/66 mmHg  Pulse 66  Temp(Src) 98.1 F (36.7 C)  Resp 14  Ht _0  (1.905 m)  Wt 293 lb 7 oz (133.102 kg)  BMI 36.68 kg/m2  SpO2 96% Physical Exam  Constitutional: He is oriented to person, place, and time. He appears well-developed and well-nourished. No distress.  Nontoxic appearing.  HENT:  Head: Normocephalic and atraumatic.  Left Ear: External ear normal.  Mouth/Throat: Oropharynx  is clear and moist. No oropharyngeal exudate.  Eyes: Conjunctivae are normal. Pupils are equal, round, and reactive to light. Right eye exhibits no discharge. Left eye exhibits no discharge.  Neck: Neck supple.  Cardiovascular: Normal rate, regular rhythm, normal heart sounds and intact distal pulses.  Exam reveals no gallop and no friction rub.   No murmur heard. Pulmonary/Chest: Effort normal and breath sounds normal. No respiratory distress. He has no wheezes. He has no rales.  Abdominal: Soft. Bowel sounds are normal. He exhibits no distension. There is tenderness. There is no rebound and no guarding.  Abdomen is soft. Bowel sounds are present. Patient has mild left-sided abdominal tenderness to palpation. No McBurney's point tenderness. Negative Rovsing sign. Negative psoas and obturator sign.   Musculoskeletal: He exhibits no edema.  Lymphadenopathy:    He has no cervical adenopathy.  Neurological: He is alert and oriented to person, place, and time. Coordination normal.  Skin: Skin is warm and dry. No rash noted. He is not diaphoretic. No erythema. No pallor.  Psychiatric: He has a normal mood and affect. His behavior is normal.  Nursing note and vitals reviewed.   ED Course  Procedures (including critical care time) Labs Review Labs Reviewed  LIPASE, BLOOD - Abnormal; Notable for the following:    Lipase 19 (*)    All other components within normal limits  COMPREHENSIVE METABOLIC PANEL - Abnormal; Notable for the following:    Potassium 2.8 (*)    Glucose, Bld 146 (*)    BUN <5 (*)    All other components within normal limits  CBC - Abnormal; Notable for the following:    RDW 15.7 (*)    All other components within normal limits  URINALYSIS, ROUTINE W REFLEX MICROSCOPIC (NOT AT [facility]) - Abnormal; Notable for the following:    Ketones, ur 15 (*)    All other components within normal limits    Imaging Review No results found.   EKG Interpretation None      Filed  Vitals:   02/26/15 2200 02/26/15 2215 02/26/15 2230 02/26/15 2245  BP: 118/63 112/55 117/65 118/66  Pulse: 63 63 60 66  Temp:    98.1 F (36.7 C)  Resp:    14  Height:      Weight:      SpO2: 95% 95% 98% 96%     MDM   Meds given in ED:  Medications  ondansetron (ZOFRAN-ODT) disintegrating tablet 4 mg (4 mg Oral Given 02/26/15 1815)  oxyCODONE-acetaminophen (PERCOCET/ROXICET) 5-325 MG per tablet 1 tablet (1 tablet Oral Given 02/26/15 1815)  sodium chloride 0.9 % bolus 1,000 mL (0 mLs Intravenous Stopped 02/26/15 2141)  ondansetron (ZOFRAN) injection 4 mg (4 mg Intravenous Given 02/26/15 2058)  dicyclomine (BENTYL) capsule 10 mg (10 mg Oral Given 02/26/15 2048)  potassium chloride SA (K-DUR,KLOR-CON) CR tablet 40 mEq (40 mEq Oral Given 02/26/15 2109)  ondansetron (ZOFRAN) injection 4 mg (4 mg Intravenous Given 02/26/15 2241)    Discharge Medication List as of 02/26/2015 10:54 PM    START taking these  medications   Details  ondansetron (ZOFRAN ODT) 4 MG disintegrating tablet Take 1 tablet (4 mg total) by mouth every 8 (eight) hours as needed for nausea or vomiting., Starting 02/26/2015, Until Discontinued, Print    polyethylene glycol powder (GLYCOLAX/MIRALAX) powder Take 17 g by mouth 2 (two) times daily. Until daily soft stools  OTC, Starting 02/26/2015, Until Discontinued, Print    potassium chloride (K-DUR) 10 MEQ tablet Take 1 tablet (10 mEq total) by mouth daily., Starting 02/26/2015, Until Discontinued, Print        Final diagnoses:  Left sided abdominal pain  Non-intractable vomiting with nausea, vomiting of unspecified type   This is a 24 y.o. male who is a smoker who presents to the ED complaining of nausea and vomiting starting last night which progressed to later also having left-sided abdominal cramping. Patient complains of 7 out of 10 left-sided abdominal cramping currently. Patient reports vomiting approximately 10 times today. On exam patient is afebrile and nontoxic  appearing. Patient has mild left-sided abdominal tenderness to palpation without rebound tenderness. No peritoneal signs. Patient denies previous abdominal surgeries. Patient's urinalysis is unremarkable. Lipase is 19. CMP indicates potassium of 2.8 and is otherwise unremarkable. CBC is unremarkable. After Zofran, Bentyl and fluid bolus the patient reports feeling better. He has tolerated by mouth 40 mg of potassium chloride in the emergency department. Patient has also tolerated juice. He reports feeling better and ready for discharge. We'll discharge the patient with Zofran and 7 day course of potassium 10 MEQ for his potassium of 2.8. Strict return precautions given. I advised the patient to follow-up with their primary care provider this week. I advised the patient to return to the emergency department with new or worsening symptoms or new concerns. The patient verbalized understanding and agreement with plan.      Waynetta Pean, PA-C 02/27/15 [id number]  Serita Grit, MD 02/27/15 [phone]

## 2015-02-26 NOTE — ED Notes (Signed)
Pt taken off of [place] 2L, now on RA sat at 95%

## 2015-02-26 NOTE — ED Notes (Signed)
The pt is c/o abd pain since last pm with nv and a temp he sounds like he has a cold

## 2015-02-26 NOTE — ED Notes (Signed)
Went into room, pt's o2 sat at 86% on RA while pt was sleeping(snoring), pt awake now and placed on 2L [place], o2 sat now 95%

## 2015-02-26 NOTE — Discharge Instructions (Signed)
Abdominal Pain Many things can cause abdominal pain. Usually, abdominal pain is not caused by a disease and will improve without treatment. It can often be observed and treated at home. Your health care provider will do a physical exam and possibly order blood tests and X-rays to help determine the seriousness of your pain. However, in many cases, more time must pass before a clear cause of the pain can be found. Before that point, your health care provider may not know if you need more testing or further treatment. HOME CARE INSTRUCTIONS  Monitor your abdominal pain for any changes. The following actions may help to alleviate any discomfort you are experiencing:  Only take over-the-counter or prescription medicines as directed by your health care provider.  Do not take laxatives unless directed to do so by your health care provider.  Try a clear liquid diet (broth, tea, or water) as directed by your health care provider. Slowly move to a bland diet as tolerated. SEEK MEDICAL CARE IF:  You have unexplained abdominal pain.  You have abdominal pain associated with nausea or diarrhea.  You have pain when you urinate or have a bowel movement.  You experience abdominal pain that wakes you in the night.  You have abdominal pain that is worsened or improved by eating food.  You have abdominal pain that is worsened with eating fatty foods.  You have a fever. SEEK IMMEDIATE MEDICAL CARE IF:   Your pain does not go away within 2 hours.  You keep throwing up (vomiting).  Your pain is felt only in portions of the abdomen, such as the right side or the left lower portion of the abdomen.  You pass bloody or black tarry stools. MAKE SURE YOU:  Understand these instructions.   Will watch your condition.   Will get help right away if you are not doing well or get worse.  Document Released: 04/26/2005 Document Revised: 07/22/2013 Document Reviewed: 03/26/2013 [facility] Patient Information  2015 [place], [place]. This information is not intended to replace advice given to you by your health care provider. Make sure you discuss any questions you have with your health care provider. Nausea and Vomiting Nausea is a sick feeling that often comes before throwing up (vomiting). Vomiting is a reflex where stomach contents come out of your mouth. Vomiting can cause severe loss of body fluids (dehydration). Children and elderly adults can become dehydrated quickly, especially if they also have diarrhea. Nausea and vomiting are symptoms of a condition or disease. It is important to find the cause of your symptoms. CAUSES   Direct irritation of the stomach lining. This irritation can result from increased acid production (gastroesophageal reflux disease), infection, food poisoning, taking certain medicines (such as nonsteroidal anti-inflammatory drugs), alcohol use, or tobacco use.  Signals from the brain.These signals could be caused by a headache, heat exposure, an inner ear disturbance, increased pressure in the brain from injury, infection, a tumor, or a concussion, pain, emotional stimulus, or metabolic problems.  An obstruction in the gastrointestinal tract (bowel obstruction).  Illnesses such as diabetes, hepatitis, gallbladder problems, appendicitis, kidney problems, cancer, sepsis, atypical symptoms of a heart attack, or eating disorders.  Medical treatments such as chemotherapy and radiation.  Receiving medicine that makes you sleep (general anesthetic) during surgery. DIAGNOSIS Your caregiver may ask for tests to be done if the problems do not improve after a few days. Tests may also be done if symptoms are severe or if the reason for the nausea and  Your caregiver may ask for tests to be done if the problems do not improve after a few days. Tests may also be done if symptoms are severe or if the reason for the nausea and vomiting is not clear. Tests may include:   Urine tests.   Blood tests.   Stool tests.   Cultures (to look for evidence of infection).   X-rays or other imaging studies.  Test results can help your caregiver make decisions about  treatment or the need for additional tests.  TREATMENT  You need to stay well hydrated. Drink frequently but in small amounts.You may wish to drink water, sports drinks, clear broth, or eat frozen ice pops or gelatin dessert to help stay hydrated.When you eat, eating slowly may help prevent nausea.There are also some antinausea medicines that may help prevent nausea.  HOME CARE INSTRUCTIONS    Take all medicine as directed by your caregiver.   If you do not have an appetite, do not force yourself to eat. However, you must continue to drink fluids.   If you have an appetite, eat a normal diet unless your caregiver tells you differently.   Eat a variety of complex carbohydrates (rice, wheat, potatoes, bread), lean meats, yogurt, fruits, and vegetables.   Avoid high-fat foods because they are more difficult to digest.   Drink enough water and fluids to keep your urine clear or pale yellow.   If you are dehydrated, ask your caregiver for specific rehydration instructions. Signs of dehydration may include:   Severe thirst.   Dry lips and mouth.   Dizziness.   Dark urine.   Decreasing urine frequency and amount.   Confusion.   Rapid breathing or pulse.  SEEK IMMEDIATE MEDICAL CARE IF:    You have blood or brown flecks (like coffee grounds) in your vomit.   You have black or bloody stools.   You have a severe headache or stiff neck.   You are confused.   You have severe abdominal pain.   You have chest pain or trouble breathing.   You do not urinate at least once every 8 hours.   You develop cold or clammy skin.   You continue to vomit for longer than 24 to 48 hours.   You have a fever.  MAKE SURE YOU:    Understand these instructions.   Will watch your condition.   Will get help right away if you are not doing well or get worse.  Document Released: 07/17/2005 Document Revised: 10/09/2011 Document Reviewed: 12/14/2010  [employer] Patient Information 2015 [employer]. This information is not  intended to replace advice given to you by your health care provider. Make sure you discuss any questions you have with your health care provider.  Hypokalemia  Hypokalemia means that the amount of potassium in the blood is lower than normal.Potassium is a chemical, called an electrolyte, that helps regulate the amount of fluid in the body. It also stimulates muscle contraction and helps nerves function properly.Most of the body's potassium is inside of cells, and only a very small amount is in the blood. Because the amount in the blood is so small, minor changes can be life-threatening.  CAUSES   Antibiotics.   Diarrhea or vomiting.   Using laxatives too much, which can cause diarrhea.   Chronic kidney disease.   Water pills (diuretics).   Eating disorders (bulimia).   Low magnesium level.   Sweating a lot.  SIGNS AND SYMPTOMS   Weakness.     Constipation.   Fatigue.   Muscle cramps.   Mental confusion.   Skipped heartbeats or irregular heartbeat (palpitations).   Tingling or numbness.  DIAGNOSIS   Your health care provider can diagnose hypokalemia with blood tests. In addition to checking your potassium level, your health care provider may also check other lab tests.  TREATMENT  Hypokalemia can be treated with potassium supplements taken by mouth or adjustments in your current medicines. If your potassium level is very low, you may need to get potassium through a vein (IV) and be monitored in the hospital. A diet high in potassium is also helpful. Foods high in potassium are:   Nuts, such as peanuts and pistachios.   Seeds, such as sunflower seeds and pumpkin seeds.   Peas, lentils, and lima beans.   Whole grain and bran cereals and breads.   Fresh fruit and vegetables, such as apricots, avocado, bananas, cantaloupe, kiwi, oranges, tomatoes, asparagus, and potatoes.   Orange and tomato juices.   Red meats.   Fruit yogurt.  HOME CARE INSTRUCTIONS   Take all medicines as prescribed by your  health care provider.   Maintain a healthy diet by including nutritious food, such as fruits, vegetables, nuts, whole grains, and lean meats.   If you are taking a laxative, be sure to follow the directions on the label.  SEEK MEDICAL CARE IF:   Your weakness gets worse.   You feel your heart pounding or racing.   You are vomiting or having diarrhea.   You are diabetic and having trouble keeping your blood glucose in the normal range.  SEEK IMMEDIATE MEDICAL CARE IF:   You have chest pain, shortness of breath, or dizziness.   You are vomiting or having diarrhea for more than 2 days.   You faint.  MAKE SURE YOU:    Understand these instructions.   Will watch your condition.   Will get help right away if you are not doing well or get worse.  Document Released: 07/17/2005 Document Revised: 05/07/2013 Document Reviewed: 01/17/2013  [employer] Patient Information 2015 [employer]. This information is not intended to replace advice given to you by your health care provider. Make sure you discuss any questions you have with your health care provider.

## 2015-03-12 ENCOUNTER — Emergency Department (HOSPITAL_COMMUNITY)
Admission: EM | Admit: 2015-03-12 | Discharge: 2015-03-12 | Disposition: A | Discharge status: Home / Self Care | Payer: Self-pay | Attending: Emergency Medicine | Admitting: Emergency Medicine

## 2015-03-12 ENCOUNTER — Encounter (HOSPITAL_COMMUNITY): Payer: Self-pay | Admitting: Emergency Medicine

## 2015-03-12 DIAGNOSIS — Z720 Tobacco use: Secondary | ICD-10-CM | POA: Insufficient documentation

## 2015-03-12 DIAGNOSIS — Z791 Long term (current) use of non-steroidal anti-inflammatories (NSAID): Secondary | ICD-10-CM | POA: Insufficient documentation

## 2015-03-12 DIAGNOSIS — Z792 Long term (current) use of antibiotics: Secondary | ICD-10-CM | POA: Insufficient documentation

## 2015-03-12 DIAGNOSIS — F10920 Alcohol use, unspecified with intoxication, uncomplicated: Secondary | ICD-10-CM

## 2015-03-12 DIAGNOSIS — Z79899 Other long term (current) drug therapy: Secondary | ICD-10-CM | POA: Insufficient documentation

## 2015-03-12 DIAGNOSIS — R000 Tachycardia, unspecified: Secondary | ICD-10-CM | POA: Insufficient documentation

## 2015-03-12 DIAGNOSIS — F10120 Alcohol abuse with intoxication, uncomplicated: Secondary | ICD-10-CM | POA: Insufficient documentation

## 2015-03-12 DIAGNOSIS — R112 Nausea with vomiting, unspecified: Secondary | ICD-10-CM | POA: Insufficient documentation

## 2015-03-12 DIAGNOSIS — F1410 Cocaine abuse, uncomplicated: Secondary | ICD-10-CM | POA: Insufficient documentation

## 2015-03-12 LAB — CBC
HEMATOCRIT: 45.3 % (ref 39.0–52.0)
HEMOGLOBIN: 14.9 g/dL (ref 13.0–17.0)
MCH: 26.7 pg (ref 26.0–34.0)
MCHC: 32.9 g/dL (ref 30.0–36.0)
MCV: 81 fL (ref 78.0–100.0)
Platelets: 336 10*3/uL (ref 150–400)
RBC: 5.59 MIL/uL (ref 4.22–5.81)
RDW: 15.3 % (ref 11.5–15.5)
WBC: 7.6 10*3/uL (ref 4.0–10.5)

## 2015-03-12 LAB — COMPREHENSIVE METABOLIC PANEL
ALBUMIN: 4.4 g/dL (ref 3.5–5.0)
ALT: 40 U/L (ref 17–63)
AST: 31 U/L (ref 15–41)
Alkaline Phosphatase: 62 U/L (ref 38–126)
Anion gap: 10 (ref 5–15)
BUN: <5 mg/dL — ABNORMAL LOW (ref 6–20)
CALCIUM: 9.2 mg/dL (ref 8.9–10.3)
CO2: 23 mmol/L (ref 22–32)
CREATININE: 0.88 mg/dL (ref 0.61–1.24)
Chloride: 106 mmol/L (ref 101–111)
GFR calc Af Amer: >60 mL/min (ref >60)
GFR calc non Af Amer: >60 mL/min (ref >60)
Glucose, Bld: 107 mg/dL — ABNORMAL HIGH (ref 65–99)
Potassium: 3.5 mmol/L (ref 3.5–5.1)
Sodium: 139 mmol/L (ref 135–145)
Total Bilirubin: 0.3 mg/dL (ref 0.3–1.2)
Total Protein: 7.7 g/dL (ref 6.5–8.1)

## 2015-03-12 LAB — URINALYSIS, ROUTINE W REFLEX MICROSCOPIC
Bilirubin Urine: NEGATIVE
GLUCOSE, UA: NEGATIVE mg/dL
Hgb urine dipstick: NEGATIVE
KETONES UR: NEGATIVE mg/dL
LEUKOCYTES UA: NEGATIVE
Nitrite: NEGATIVE
PROTEIN: NEGATIVE mg/dL
Specific Gravity, Urine: 1.005 (ref 1.005–1.030)
Urobilinogen, UA: 0.2 mg/dL (ref 0.0–1.0)
pH: 6 (ref 5.0–8.0)

## 2015-03-12 LAB — RAPID URINE DRUG SCREEN, HOSP PERFORMED
Amphetamines: NOT DETECTED
BENZODIAZEPINES: NOT DETECTED
Barbiturates: NOT DETECTED
COCAINE: POSITIVE — AB
Opiates: NOT DETECTED
Tetrahydrocannabinol: NOT DETECTED

## 2015-03-12 LAB — LIPASE, BLOOD: Lipase: 54 U/L — ABNORMAL HIGH (ref 22–51)

## 2015-03-12 LAB — ETHANOL: ALCOHOL ETHYL (B): 125 mg/dL — AB (ref <5)

## 2015-03-12 MED ORDER — SODIUM CHLORIDE 0.9 % IV BOLUS (SEPSIS)
1000.0000 mL | Freq: Once | INTRAVENOUS | Status: AC
  Administered 2015-03-12: 1000 mL via INTRAVENOUS

## 2015-03-12 MED ORDER — ONDANSETRON 4 MG PO TBDP: 4.0000 mg | ORAL_TABLET | Freq: Three times a day (TID) | ORAL | Status: DC | PRN

## 2015-03-12 MED ORDER — ONDANSETRON 4 MG PO TBDP
4.0000 mg | ORAL_TABLET | Freq: Once | ORAL | Status: AC | PRN
  Administered 2015-03-12: 4 mg via ORAL
  Filled 2015-03-12: qty 1

## 2015-03-12 MED ORDER — ONDANSETRON HCL 4 MG/2ML IJ SOLN
4.0000 mg | Freq: Once | INTRAMUSCULAR | Status: AC
  Administered 2015-03-12: 4 mg via INTRAVENOUS
  Filled 2015-03-12: qty 2

## 2015-03-12 MED ORDER — METOCLOPRAMIDE HCL 5 MG/ML IJ SOLN
10.0000 mg | Freq: Once | INTRAMUSCULAR | Status: AC
  Administered 2015-03-12: 10 mg via INTRAVENOUS
  Filled 2015-03-12: qty 2

## 2015-03-12 NOTE — Discharge Instructions (Signed)

## 2015-03-12 NOTE — ED Notes (Signed)
Pt complaint of n/v post ETOH use; pt reports beer last drink last night but "not that much."

## 2015-03-12 NOTE — ED Provider Notes (Signed)
CSN: [id number]     Arrival date & time 03/12/15  1616 History   First MD Initiated Contact with Patient 03/12/15 1754     Chief Complaint  Patient presents with  . Emesis     (Consider location/radiation/quality/duration/timing/severity/associated sxs/prior Treatment) HPI   24 year old male presenting for evaluation of nausea and vomiting. Patient is a poor historian, history is slightly limited. Patient reports this afternoon he went to see his son and his baby Mama's house and afterward he became nauseous and was vomiting multiple times. Vomitus has been nonbloody nonbilious. Continue to endorse significant nausea with generalized fatigue. When asked if he has any pain he reported having mild left lower quadrant abdominal pain during the vomiting episodes. States pain is 2 out of 10. No specific treatment tried. Denies any fever, chills, headache, chest pain, shortness of breath, productive cough, back pain, dysuria, hematuria, black tarry stools, or rash. He denies any recent travel or eating exotic food. He reported having one similar episode 2 weeks ago when he was seen in the ER and subsequent to discharge without any specific diagnosis. He has not follow with a specialist for this. He denies any recent alcohol use or street drug use. When asked if he is depressed he said no. Patient also denies having any penile discomfort or scrotal pain. No history of hernia.    History reviewed. No pertinent past medical history. History reviewed. No pertinent past surgical history. No family history on file. Social History  Substance Use Topics  . Smoking status: Current Every Day Smoker -- 1.00 packs/day  . Smokeless tobacco: None  . Alcohol Use: Yes    Review of Systems  All other systems reviewed and are negative.     Allergies  Review of patient's allergies indicates no known allergies.  Home Medications   Prior to Admission medications   Medication Sig Start Date End Date Taking?  Authorizing Provider  amoxicillin (AMOXIL) 875 MG tablet Take 1 tablet (875 mg total) by mouth 2 (two) times daily. 08/12/13   Freeman Caldron Baker, PA-C  cyclobenzaprine (FLEXERIL) 5 MG tablet Take 1 tablet (5 mg total) by mouth 3 (three) times daily as needed for muscle spasms. 02/17/14   Mariel Aloe, MD  HYDROcodone-acetaminophen (NORCO) 5-325 MG per tablet Take 1-2 tablets by mouth every 4 (four) hours as needed for moderate pain. 08/12/13   Liam Graham, PA-C  meloxicam (MOBIC) 7.5 MG tablet Take 1 tablet (7.5 mg total) by mouth daily. 02/17/14   Mariel Aloe, MD  ondansetron (ZOFRAN ODT) 4 MG disintegrating tablet Take 1 tablet (4 mg total) by mouth every 8 (eight) hours as needed for nausea or vomiting. 02/26/15   Waynetta Pean, PA-C  polyethylene glycol powder (GLYCOLAX/MIRALAX) powder Take 17 g by mouth 2 (two) times daily. Until daily soft stools  OTC 02/26/15   Waynetta Pean, PA-C  potassium chloride (K-DUR) 10 MEQ tablet Take 1 tablet (10 mEq total) by mouth daily. 02/26/15   Waynetta Pean, PA-C  predniSONE (DELTASONE) 10 MG tablet 4 tabs PO QD for 4 days;  3 tabs PO QD for 3 days;  2 tabs PO QD for 2 days;  1 tab PO QD for 1 day 08/12/13   Liam Graham, PA-C  PredniSONE 5 MG KIT Take 1 kit (5 mg total) by mouth daily after breakfast. Prednisone 5 mg 6 day dosepack.  Take as directed. 03/16/12   Harden Mo, MD  traMADol (ULTRAM) 50 MG tablet Take 1  tablet (50 mg total) by mouth every 6 (six) hours as needed. 02/17/14   Mariel Aloe, MD   BP 134/74 mmHg  Pulse 115  Temp(Src) 98 F (36.7 C) (Oral)  Resp 16  SpO2 95% Physical Exam  Constitutional: He appears well-developed and well-nourished. No distress.  African-American male, actively vomiting and appears uncomfortable.  HENT:  Head: Atraumatic.  Eyes: Conjunctivae are normal.  Neck: Neck supple.  Cardiovascular:  Tachycardia without murmurs rubs or gallops  Pulmonary/Chest: Breath sounds normal. He is in respiratory  distress. He has no wheezes. He exhibits no tenderness.  Abdominal: Soft. Bowel sounds are normal. He exhibits no distension. There is no tenderness.  Negative Murphy sign, no pain at McBurney's point, no peritoneal sign.  Neurological: He is alert.  Skin: No rash noted.  Psychiatric: His affect is blunt. His speech is delayed. He is withdrawn. Thought content is not paranoid. He expresses no homicidal and no suicidal ideation.  Nursing note and vitals reviewed.   ED Course  Procedures (including critical care time)  Patient here with nausea and vomiting. Mild left lower quadrant abdominal pain but pain is not reproducible on exam. Patient denies alcohol use, however nursing note that patient has nausea and vomiting S/P EtOH use last night.  8:35 PM Examination, abdomen is nontender. Patient still endorsed mild nausea after receiving IV fluid and Reglan. Patient will receive additional fluid and Zofran for symptomatic control. He is no longer tachycardic. He is afebrile. Alcohol level is 125. He tested positive for cocaine. Mildly elevated lipase at 54 but low suspicion for acute pancreatitis. The remainder of labs are reassuring.  9:41 PM After receiving antinausea medication and IV fluid, patient felt much better. He is now able to tolerates fluid. He request to be discharged. Return precautions discussed.  Labs Review Labs Reviewed  LIPASE, BLOOD - Abnormal; Notable for the following:    Lipase 54 (*)    All other components within normal limits  COMPREHENSIVE METABOLIC PANEL - Abnormal; Notable for the following:    Glucose, Bld 107 (*)    BUN <5 (*)    All other components within normal limits  ETHANOL - Abnormal; Notable for the following:    Alcohol, Ethyl (B) 125 (*)    All other components within normal limits  URINE RAPID DRUG SCREEN, HOSP PERFORMED - Abnormal; Notable for the following:    Cocaine POSITIVE (*)    All other components within normal limits  CBC   URINALYSIS, ROUTINE W REFLEX MICROSCOPIC (NOT AT [facility])    Imaging Review No results found. I, Durwin Davisson, personally reviewed and evaluated these images and lab results as part of my medical decision-making.   EKG Interpretation None      MDM   Final diagnoses:  Non-intractable vomiting with nausea, vomiting of unspecified type  Alcohol intoxication, uncomplicated    BP 051/10 mmHg  Pulse 75  Temp(Src) 98 F (36.7 C) (Oral)  Resp 18  SpO2 100%  I have reviewed nursing notes and vital signs. I reviewed available ER/hospitalization records through the EMR     Domenic Moras, PA-C 03/12/15 2142  Wandra Arthurs, MD 03/12/15 2316

## 2015-03-12 NOTE — ED Notes (Signed)
[employer] came to [facility] and stated that the patient was lying on the sidewalk. Upon arrival , the patient denied that he fell. Patient stated, "I was vomiting." Patient brought in to the triage area. Patient refused to stay in the wheelchair. Patient got up and walked to the lobby without difficulty.

## 2015-03-12 NOTE — ED Notes (Addendum)
Pt request to step outside; pt informed if walks off premise will be discharged AMA.

## 2015-03-12 NOTE — ED Notes (Signed)
Pt given a Sprite and has not had any episodes of emesis as of this writing.

## 2015-07-26 ENCOUNTER — Emergency Department (HOSPITAL_COMMUNITY)
Admission: EM | Admit: 2015-07-26 | Discharge: 2015-07-26 | Disposition: A | Discharge status: Home / Self Care | Payer: Self-pay | Attending: Emergency Medicine | Admitting: Emergency Medicine

## 2015-07-26 ENCOUNTER — Emergency Department (HOSPITAL_COMMUNITY): Payer: Self-pay

## 2015-07-26 ENCOUNTER — Encounter (HOSPITAL_COMMUNITY): Payer: Self-pay | Admitting: *Deleted

## 2015-07-26 DIAGNOSIS — X509XXA Other and unspecified overexertion or strenuous movements or postures, initial encounter: Secondary | ICD-10-CM | POA: Insufficient documentation

## 2015-07-26 DIAGNOSIS — Y9301 Activity, walking, marching and hiking: Secondary | ICD-10-CM | POA: Insufficient documentation

## 2015-07-26 DIAGNOSIS — F17200 Nicotine dependence, unspecified, uncomplicated: Secondary | ICD-10-CM | POA: Insufficient documentation

## 2015-07-26 DIAGNOSIS — S93401A Sprain of unspecified ligament of right ankle, initial encounter: Secondary | ICD-10-CM | POA: Insufficient documentation

## 2015-07-26 DIAGNOSIS — Y998 Other external cause status: Secondary | ICD-10-CM | POA: Insufficient documentation

## 2015-07-26 DIAGNOSIS — Y9289 Other specified places as the place of occurrence of the external cause: Secondary | ICD-10-CM | POA: Insufficient documentation

## 2015-07-26 MED ORDER — HYDROCODONE-ACETAMINOPHEN 5-325 MG PO TABS
1.0000 | ORAL_TABLET | Freq: Four times a day (QID) | ORAL | Status: DC | PRN
Start: 2015-07-26 — End: 2019-06-18

## 2015-07-26 NOTE — Discharge Instructions (Signed)

## 2015-07-26 NOTE — ED Provider Notes (Signed)
CSN: [id number]     Arrival date & time 07/26/15  1812 History   By signing my name below, I, Jarvis Morgan, attest that this documentation has been prepared under the direction and in the presence of Roxy Horseman, PA-C Electronically Signed: Jarvis Morgan, ED Scribe. 07/26/2015. 7:47 PM.    Chief Complaint  Patient presents with  . Ankle Pain   The history is provided by the patient. No language interpreter was used.    [name] is a 24 y.o. male who presents to the Emergency Department with a chief complaint of constant, moderate, right ankle pain s/p rolling his right ankle 3 days ago. He states he was walking and he missed a step and rolled onto the right ankle. He reports associated mild right ankle swelling. Pt notes the pain is exacerbated with ambulation and bearing weight. He states he borrowed crutches from a friend and has been using those to get around. Pt has not taken any medications prior to arrival. Pt notes he has been resting the ankle with mild relief. He denies any other injuries or prior injuries to his right ankle. Pt denies any numbness, weakness, sensation loss or other associated symptoms.   History reviewed. No pertinent past medical history. History reviewed. No pertinent past surgical history. History reviewed. No pertinent family history. Social History  Substance Use Topics  . Smoking status: Current Every Day Smoker -- 1.00 packs/day  . Smokeless tobacco: None  . Alcohol Use: Yes    Review of Systems  Musculoskeletal: Positive for joint swelling and arthralgias.  Skin: Negative for color change and wound.  Neurological: Negative for weakness and numbness.      Allergies  Review of patient's allergies indicates no known allergies.  Home Medications   Prior to Admission medications   Medication Sig Start Date End Date Taking? Authorizing Provider  meloxicam (MOBIC) 7.5 MG tablet Take 1 tablet (7.5 mg total) by mouth daily. Patient  not taking: Reported on 03/12/2015 02/17/14   Narda Bonds, MD  ondansetron (ZOFRAN ODT) 4 MG disintegrating tablet Take 1 tablet (4 mg total) by mouth every 8 (eight) hours as needed for nausea or vomiting. 03/12/15   Fayrene Helper, PA-C  polyethylene glycol powder (GLYCOLAX/MIRALAX) powder Take 17 g by mouth 2 (two) times daily. Until daily soft stools  OTC Patient not taking: Reported on 03/12/2015 02/26/15   Everlene Farrier, PA-C  potassium chloride (K-DUR) 10 MEQ tablet Take 1 tablet (10 mEq total) by mouth daily. Patient not taking: Reported on 03/12/2015 02/26/15   Everlene Farrier, PA-C   Triage Vitals: Pulse 79  Temp(Src) 98.7 F (37.1 C) (Oral)  Resp 18  Ht  (1.88 m)  Wt 280 lb (127.007 kg)  BMI 35.93 kg/m2  SpO2 99%  Physical Exam  Physical Exam  Constitutional: Pt appears well-developed and well-nourished. No distress.  HENT:  Head: Normocephalic and atraumatic.  Eyes: Conjunctivae are normal.  Neck: Normal range of motion.  Cardiovascular: Normal rate, regular rhythm and intact distal pulses.   Capillary refill < 3 sec  Pulmonary/Chest: Effort normal and breath sounds normal.  Musculoskeletal: Pt exhibits tenderness. Pt exhibits no edema.  ROM: 5/5 Neurological: Pt  is alert. Coordination normal.  Sensation intact  Strength 5/5  Skin: Skin is warm and dry. Pt is not diaphoretic.  No tenting of the skin  Psychiatric: Pt has a normal mood and affect.  Nursing note and vitals reviewed.   ED Course  Procedures (including critical care  time)  DIAGNOSTIC STUDIES: Oxygen Saturation is 99% on RA, normal by my interpretation.    COORDINATION OF CARE: 6:35 PM- Will order imaging of right ankle. Pt advised of plan for treatment and pt agrees.      Imaging Review Dg Ankle Complete Right  07/26/2015  CLINICAL DATA:  Right ankle pain status post rolling right ankle 3 days ago. EXAM: RIGHT ANKLE - COMPLETE 3+ VIEW COMPARISON:  None. FINDINGS: There is no evidence of  fracture, dislocation, or joint effusion. There is no evidence of arthropathy or other focal bone abnormality. Soft tissues are unremarkable. IMPRESSION: Negative. Electronically Signed   By: Ted Mcalpineobrinka  Dimitrova M.D.   On: 07/26/2015 19:19   I have personally reviewed and evaluated these images as part of my medical decision-making.    MDM   Final diagnoses:  Ankle sprain, right, initial encounter    Patient with right ankle sprain, plain films are negative, will discharge with cam walker, crutches, and pain medicine. Recommend follow-up.  I personally performed the services described in this documentation, which was scribed in my presence. The recorded information has been reviewed and is accurate.     Roxy Horsemanobert Akshar Starnes, PA-C 07/26/15 2017  Derwood KaplanAnkit Nanavati, MD 07/27/15 [id number]

## 2015-07-26 NOTE — ED Notes (Signed)
PT reports pain to RT ankle that started 2 days ago . Pt reports he rolled his RT ankle.

## 2015-07-26 NOTE — ED Notes (Signed)
Pt departed in NAD.  

## 2015-12-25 ENCOUNTER — Encounter (HOSPITAL_COMMUNITY): Payer: Self-pay | Admitting: Emergency Medicine

## 2015-12-25 ENCOUNTER — Emergency Department (HOSPITAL_COMMUNITY): Payer: Self-pay

## 2015-12-25 DIAGNOSIS — R0789 Other chest pain: Secondary | ICD-10-CM | POA: Insufficient documentation

## 2015-12-25 DIAGNOSIS — F17200 Nicotine dependence, unspecified, uncomplicated: Secondary | ICD-10-CM | POA: Insufficient documentation

## 2015-12-25 DIAGNOSIS — R0602 Shortness of breath: Secondary | ICD-10-CM | POA: Insufficient documentation

## 2015-12-25 DIAGNOSIS — Z79899 Other long term (current) drug therapy: Secondary | ICD-10-CM | POA: Insufficient documentation

## 2015-12-25 LAB — CBC
HEMATOCRIT: 44.7 % (ref 39.0–52.0)
Hemoglobin: 14.4 g/dL (ref 13.0–17.0)
MCH: 25.9 pg — ABNORMAL LOW (ref 26.0–34.0)
MCHC: 32.2 g/dL (ref 30.0–36.0)
MCV: 80.4 fL (ref 78.0–100.0)
Platelets: 344 10*3/uL (ref 150–400)
RBC: 5.56 MIL/uL (ref 4.22–5.81)
RDW: 14.4 % (ref 11.5–15.5)
WBC: 7.6 10*3/uL (ref 4.0–10.5)

## 2015-12-25 LAB — BASIC METABOLIC PANEL
Anion gap: 9 (ref 5–15)
BUN: <5 mg/dL — ABNORMAL LOW (ref 6–20)
CHLORIDE: 104 mmol/L (ref 101–111)
CO2: 23 mmol/L (ref 22–32)
Calcium: 9.1 mg/dL (ref 8.9–10.3)
Creatinine, Ser: 0.9 mg/dL (ref 0.61–1.24)
GFR calc Af Amer: >60 mL/min (ref >60)
GFR calc non Af Amer: >60 mL/min (ref >60)
Glucose, Bld: 106 mg/dL — ABNORMAL HIGH (ref 65–99)
POTASSIUM: 3.3 mmol/L — AB (ref 3.5–5.1)
Sodium: 136 mmol/L (ref 135–145)

## 2015-12-25 LAB — I-STAT TROPONIN, ED: Troponin i, poc: 0 ng/mL (ref 0.00–0.08)

## 2015-12-25 NOTE — ED Notes (Addendum)
Pt states he started having left sided sharp chest pains around 2 pm today with a dry productive cough. Pt states he has also drank 6 beers today. Pt is slow to answer questions and keeps repeating himself. pts eyes appear red. Lung sounds clear and equal.

## 2015-12-26 ENCOUNTER — Emergency Department (HOSPITAL_COMMUNITY)
Admission: EM | Admit: 2015-12-26 | Discharge: 2015-12-26 | Disposition: A | Discharge status: Home / Self Care | Payer: Self-pay | Attending: Emergency Medicine | Admitting: Emergency Medicine

## 2015-12-26 DIAGNOSIS — R079 Chest pain, unspecified: Secondary | ICD-10-CM

## 2015-12-26 MED ORDER — GI COCKTAIL ~~LOC~~
30.0000 mL | Freq: Once | ORAL | Status: AC
  Administered 2015-12-26: 30 mL via ORAL
  Filled 2015-12-26: qty 30

## 2015-12-26 MED ORDER — FAMOTIDINE 20 MG PO TABS: 20.0000 mg | ORAL_TABLET | Freq: Two times a day (BID) | ORAL | Status: DC

## 2015-12-26 NOTE — Discharge Instructions (Signed)
Nonspecific Chest Pain  °Chest pain can be caused by many different conditions. There is always a chance that your pain could be related to something serious, such as a heart attack or a blood clot in your lungs. Chest pain can also be caused by conditions that are not life-threatening. If you have chest pain, it is very important to follow up with your health care provider. °CAUSES  °Chest pain can be caused by: °· Heartburn. °· Pneumonia or bronchitis. °· Anxiety or stress. °· Inflammation around your heart (pericarditis) or lung (pleuritis or pleurisy). °· A blood clot in your lung. °· A collapsed lung (pneumothorax). It can develop suddenly on its own (spontaneous pneumothorax) or from trauma to the chest. °· Shingles infection (varicella-zoster virus). °· Heart attack. °· Damage to the bones, muscles, and cartilage that make up your chest wall. This can include: °¨ Bruised bones due to injury. °¨ Strained muscles or cartilage due to frequent or repeated coughing or overwork. °¨ Fracture to one or more ribs. °¨ Sore cartilage due to inflammation (costochondritis). °RISK FACTORS  °Risk factors for chest pain may include: °· Activities that increase your risk for trauma or injury to your chest. °· Respiratory infections or conditions that cause frequent coughing. °· Medical conditions or overeating that can cause heartburn. °· Heart disease or family history of heart disease. °· Conditions or health behaviors that increase your risk of developing a blood clot. °· Having had chicken pox (varicella zoster). °SIGNS AND SYMPTOMS °Chest pain can feel like: °· Burning or tingling on the surface of your chest or deep in your chest. °· Crushing, pressure, aching, or squeezing pain. °· Dull or sharp pain that is worse when you move, cough, or take a deep breath. °· Pain that is also felt in your back, neck, shoulder, or arm, or pain that spreads to any of these areas. °Your chest pain may come and go, or it may stay  constant. °DIAGNOSIS °Lab tests or other studies may be needed to find the cause of your pain. Your health care provider may have you take a test called an ambulatory ECG (electrocardiogram). An ECG records your heartbeat patterns at the time the test is performed. You may also have other tests, such as: °· Transthoracic echocardiogram (TTE). During echocardiography, sound waves are used to create a picture of all of the heart structures and to look at how blood flows through your heart. °· Transesophageal echocardiogram (TEE). This is a more advanced imaging test that obtains images from inside your body. It allows your health care provider to see your heart in finer detail. °· Cardiac monitoring. This allows your health care provider to monitor your heart rate and rhythm in real time. °· Holter monitor. This is a portable device that records your heartbeat and can help to diagnose abnormal heartbeats. It allows your health care provider to track your heart activity for several days, if needed. °· Stress tests. These can be done through exercise or by taking medicine that makes your heart beat more quickly. °· Blood tests. °· Imaging tests. °TREATMENT  °Your treatment depends on what is causing your chest pain. Treatment may include: °· Medicines. These may include: °¨ Acid blockers for heartburn. °¨ Anti-inflammatory medicine. °¨ Pain medicine for inflammatory conditions. °¨ Antibiotic medicine, if an infection is present. °¨ Medicines to dissolve blood clots. °¨ Medicines to treat coronary artery disease. °· Supportive care for conditions that do not require medicines. This may include: °¨ Resting. °¨ Applying heat   or cold packs to injured areas. °¨ Limiting activities until pain decreases. °HOME CARE INSTRUCTIONS °· If you were prescribed an antibiotic medicine, finish it all even if you start to feel better. °· Avoid any activities that bring on chest pain. °· Do not use any tobacco products, including  cigarettes, chewing tobacco, or electronic cigarettes. If you need help quitting, ask your health care provider. °· Do not drink alcohol. °· Take medicines only as directed by your health care provider. °· Keep all follow-up visits as directed by your health care provider. This is important. This includes any further testing if your chest pain does not go away. °· If heartburn is the cause for your chest pain, you may be told to keep your head raised (elevated) while sleeping. This reduces the chance that acid will go from your stomach into your esophagus. °· Make lifestyle changes as directed by your health care provider. These may include: °¨ Getting regular exercise. Ask your health care provider to suggest some activities that are safe for you. °¨ Eating a heart-healthy diet. A registered dietitian can help you to learn healthy eating options. °¨ Maintaining a healthy weight. °¨ Managing diabetes, if necessary. °¨ Reducing stress. °SEEK MEDICAL CARE IF: °· Your chest pain does not go away after treatment. °· You have a rash with blisters on your chest. °· You have a fever. °SEEK IMMEDIATE MEDICAL CARE IF:  °· Your chest pain is worse. °· You have an increasing cough, or you cough up blood. °· You have severe abdominal pain. °· You have severe weakness. °· You faint. °· You have chills. °· You have sudden, unexplained chest discomfort. °· You have sudden, unexplained discomfort in your arms, back, neck, or jaw. °· You have shortness of breath at any time. °· You suddenly start to sweat, or your skin gets clammy. °· You feel nauseous or you vomit. °· You suddenly feel light-headed or dizzy. °· Your heart begins to beat quickly, or it feels like it is skipping beats. °These symptoms may represent a serious problem that is an emergency. Do not wait to see if the symptoms will go away. Get medical help right away. Call your local emergency services (911 in the [place] Do not drive yourself to the hospital. °  °This  information is not intended to replace advice given to you by your health care provider. Make sure you discuss any questions you have with your health care provider. °  °Document Released: 04/26/2005 Document Revised: 08/07/2014 Document Reviewed: 02/20/2014 °Elsevier Interactive Patient Education ©2016 [employer] ° °

## 2015-12-26 NOTE — ED Provider Notes (Signed)
CSN: [id number]     Arrival date & time 12/25/15  2126 History   First MD Initiated Contact with Patient 12/26/15 0207     Chief Complaint  Patient presents with  . Chest Pain     (Consider location/radiation/quality/duration/timing/severity/associated sxs/prior Treatment) HPI Comments: Patient with no significant medical history presents with left lower chest discomfort that started earlier this afternoon while at home. He admits to drinking a 6-pack today. He denies drug use, specifically, cocaine use. No vomiting. He had SOB initially but this resolved over time. He denies history of similar symptoms. No alleviating or aggravating factors identified.   Patient is a 25 y.o. male presenting with chest pain. The history is provided by the patient. No language interpreter was used.  Chest Pain Pain location:  L chest Pain radiates to:  Does not radiate Pain radiates to the back: no   Pain severity:  Moderate Onset quality:  Gradual Duration:  12 hours Timing:  Constant Progression:  Waxing and waning Chronicity:  New Associated symptoms: shortness of breath   Associated symptoms: no abdominal pain, no fever, no nausea and not vomiting     History reviewed. No pertinent past medical history. History reviewed. No pertinent past surgical history. No family history on file. Social History  Substance Use Topics  . Smoking status: Current Every Day Smoker -- 1.00 packs/day  . Smokeless tobacco: None  . Alcohol Use: Yes    Review of Systems  Constitutional: Negative for fever.  Respiratory: Positive for shortness of breath.   Cardiovascular: Positive for chest pain.  Gastrointestinal: Negative for nausea, vomiting and abdominal pain.  Musculoskeletal: Negative for myalgias.  Neurological: Negative for light-headedness.      Allergies  Review of patient's allergies indicates no known allergies.  Home Medications   Prior to Admission medications   Medication Sig Start Date  End Date Taking? Authorizing Provider  HYDROcodone-acetaminophen (NORCO/VICODIN) 5-325 MG tablet Take 1-2 tablets by mouth every 6 (six) hours as needed. 07/26/15   Roxy Horsemanobert Browning, PA-C  meloxicam (MOBIC) 7.5 MG tablet Take 1 tablet (7.5 mg total) by mouth daily. Patient not taking: Reported on 03/12/2015 02/17/14   Narda Bondsalph A Nettey, MD  ondansetron (ZOFRAN ODT) 4 MG disintegrating tablet Take 1 tablet (4 mg total) by mouth every 8 (eight) hours as needed for nausea or vomiting. 03/12/15   Fayrene HelperBowie Tran, PA-C  polyethylene glycol powder (GLYCOLAX/MIRALAX) powder Take 17 g by mouth 2 (two) times daily. Until daily soft stools  OTC Patient not taking: Reported on 03/12/2015 02/26/15   Everlene FarrierWilliam Dansie, PA-C  potassium chloride (K-DUR) 10 MEQ tablet Take 1 tablet (10 mEq total) by mouth daily. Patient not taking: Reported on 03/12/2015 02/26/15   Everlene FarrierWilliam Dansie, PA-C   BP 138/80 mmHg  Pulse 78  Temp(Src) 98.5 F (36.9 C) (Oral)  Resp 16  Ht 6\' 3"  (1.905 m)  Wt 146.512 kg  BMI 40.37 kg/m2  SpO2 98% Physical Exam  Constitutional: He is oriented to person, place, and time. He appears well-developed and well-nourished.  HENT:  Head: Normocephalic.  Neck: Normal range of motion. Neck supple.  Cardiovascular: Normal rate and regular rhythm.   Pulmonary/Chest: Effort normal and breath sounds normal. He has no wheezes. He has no rales. He exhibits tenderness (Mild, reproducible tenderness to lower left chest wall. ).  Abdominal: Soft. Bowel sounds are normal. There is no tenderness. There is no rebound and no guarding.  Musculoskeletal: Normal range of motion. He exhibits no edema.  Neurological: He  is alert and oriented to person, place, and time.  Skin: Skin is warm and dry. No rash noted.  Psychiatric: He has a normal mood and affect.    ED Course  Procedures (including critical care time) Labs Review Labs Reviewed  BASIC METABOLIC PANEL - Abnormal; Notable for the following:    Potassium 3.3 (*)     Glucose, Bld 106 (*)    BUN <5 (*)    All other components within normal limits  CBC - Abnormal; Notable for the following:    MCH 25.9 (*)    All other components within normal limits  I-STAT TROPOININ, ED    Imaging Review Dg Chest 2 View  12/25/2015  CLINICAL DATA:  Acute onset of left-sided chest pain. Initial encounter. EXAM: CHEST  2 VIEW COMPARISON:  None. FINDINGS: The lungs are well-aerated. Mild vascular congestion is noted. There is no evidence of focal opacification, pleural effusion or pneumothorax. The heart is normal in size; the mediastinal contour is within normal limits. No acute osseous abnormalities are seen. IMPRESSION: Mild vascular congestion noted.  Lungs remain grossly clear. Electronically Signed   By: Roanna Raider M.D.   On: 12/25/2015 23:17   I have personally reviewed and evaluated these images and lab results as part of my medical decision-making.   EKG Interpretation None      MDM   Final diagnoses:  None    1. Left chest pain  The patient presents with left chest pain that started earlier today while drinking alcohol and eating chicken wings. It has been constant. He has a negative troponin that was performed at least 6 hours after onset of pain. CXR negative. Doubt ACS. Consider GI origin of pain likely. Feel he can be discharged home with PCP referral for further outpatient management.     Elpidio Anis, PA-C 12/26/15 1610  Derwood Kaplan, MD 12/26/15 [phone]

## 2015-12-26 NOTE — ED Notes (Signed)
Patient left at this time with all belongings. Refused wheelchair 

## 2017-01-03 IMAGING — DX DG CHEST 2V
2 series · 2 of 2 positions shown · non-contrast
Comparison: None.

CLINICAL DATA: Acute onset of left-sided chest pain. Initial
encounter.

EXAM:
CHEST  2 VIEW

[chest pa]
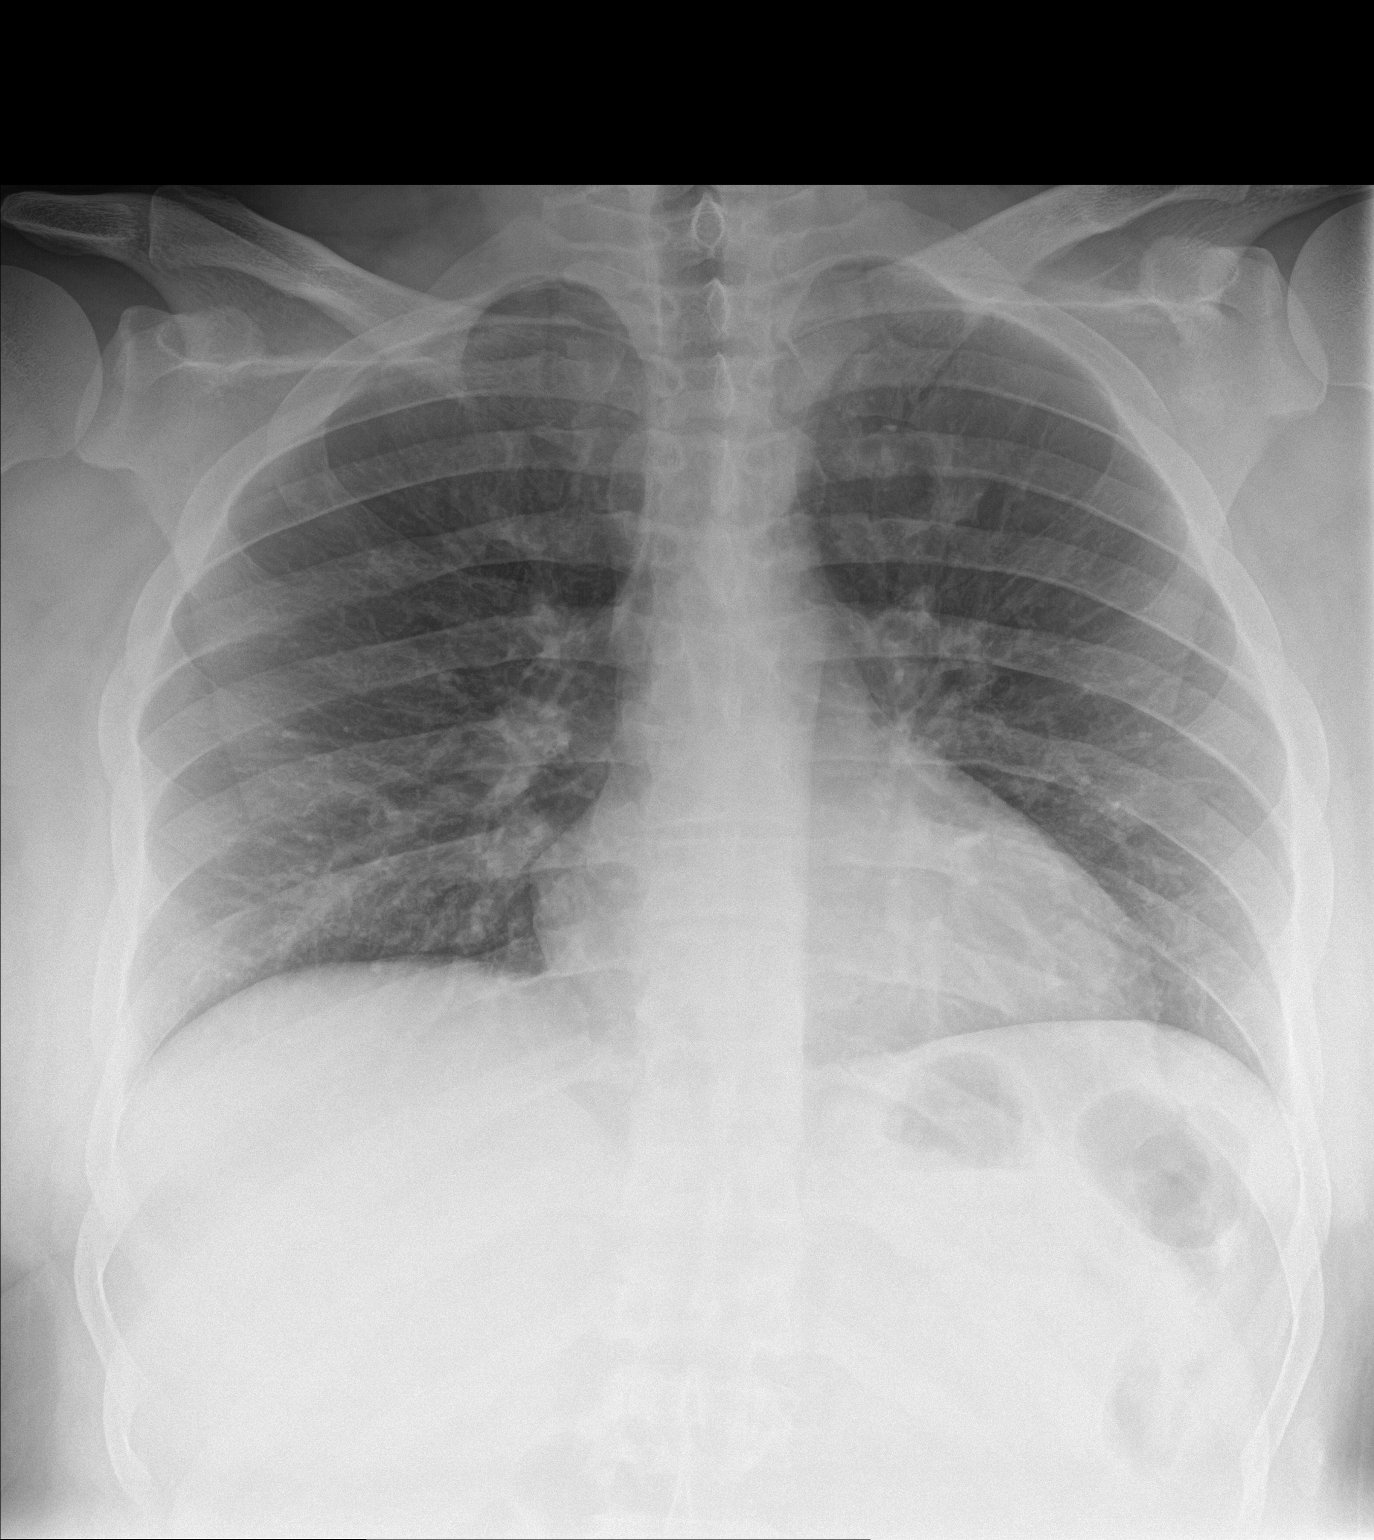

[chest lat]
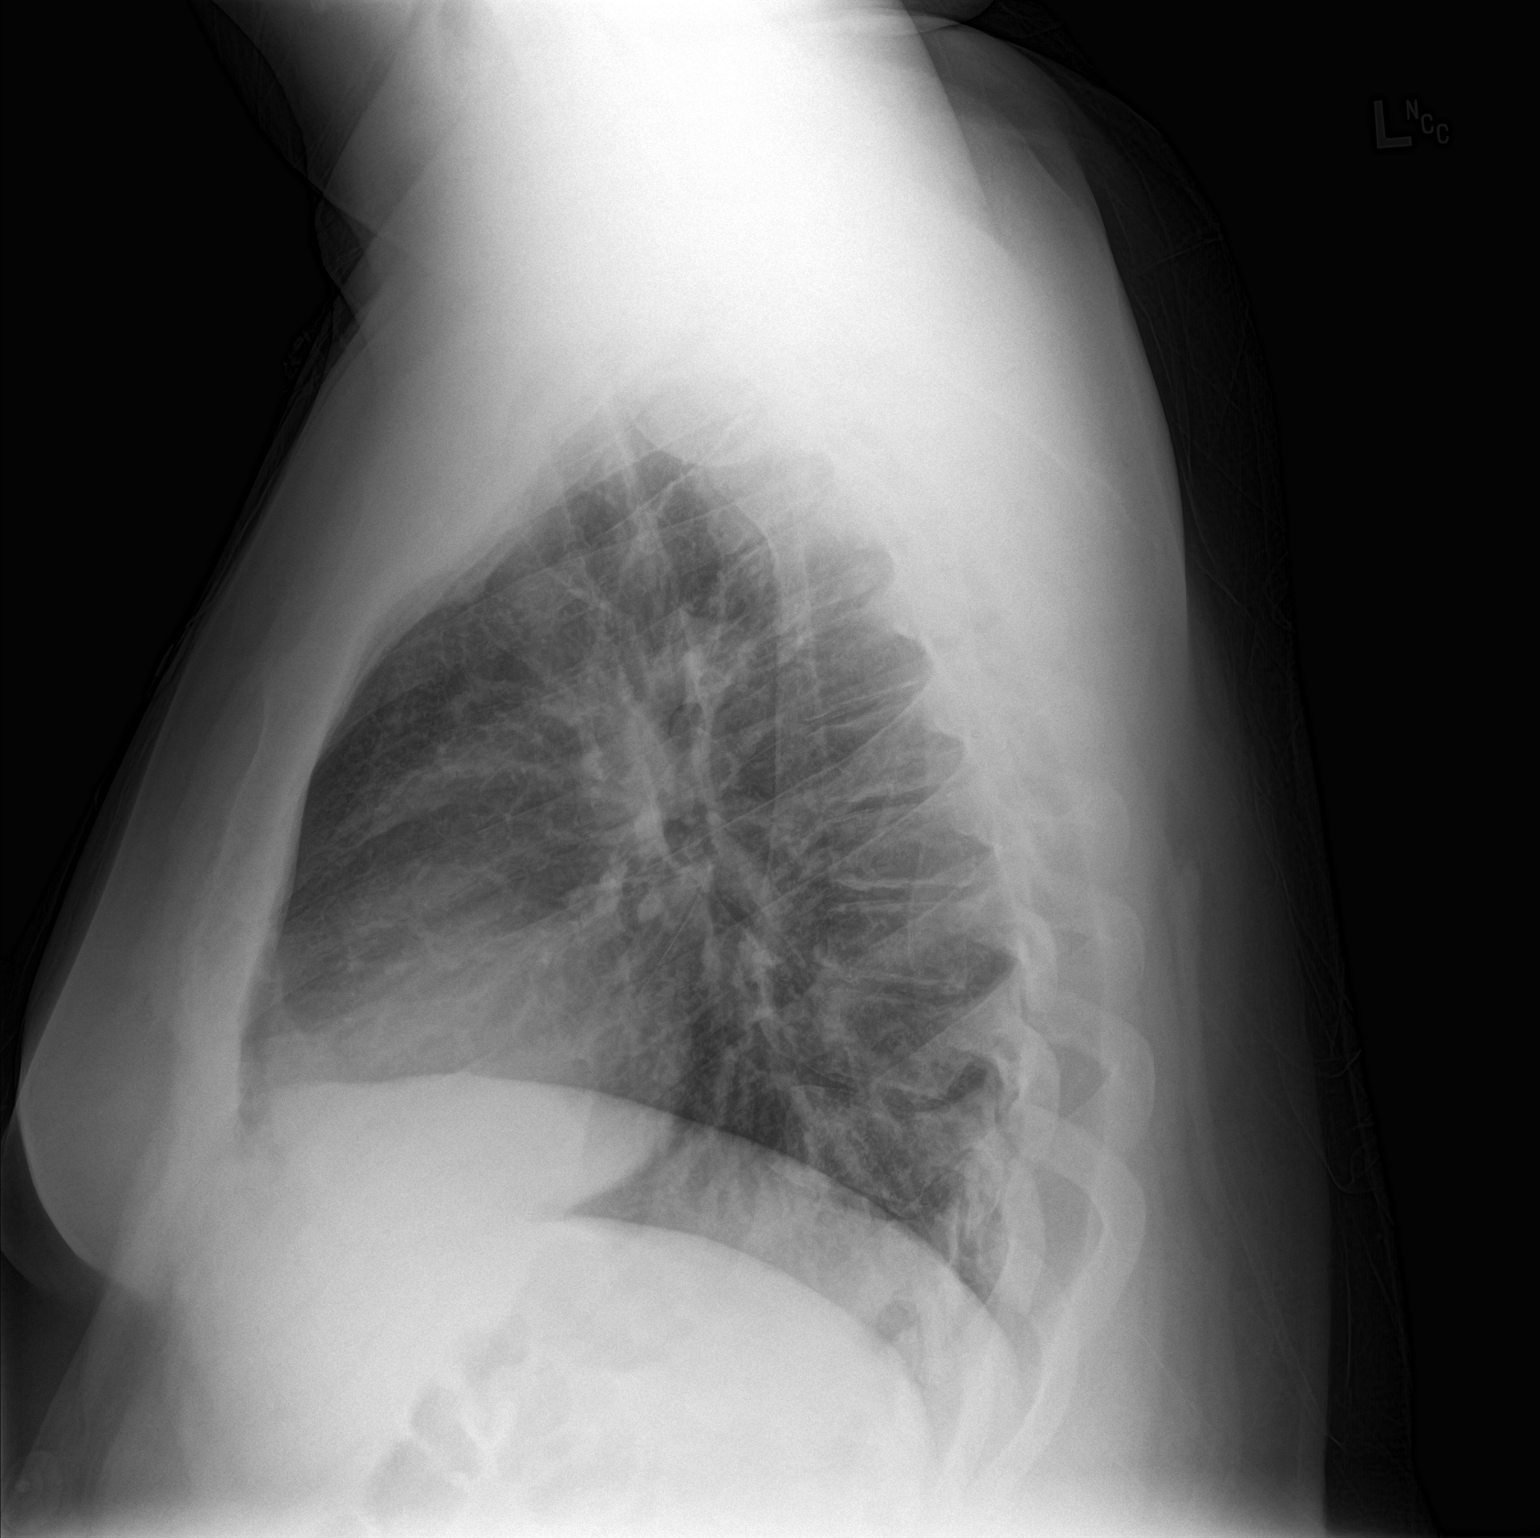

[2 of 2 positions shown; findings below may reference images not displayed]

FINDINGS: The lungs are well-aerated. Mild vascular congestion is noted. There
is no evidence of focal opacification, pleural effusion or
pneumothorax.

The heart is normal in size; the mediastinal contour is within
normal limits. No acute osseous abnormalities are seen.
IMPRESSION: Mild vascular congestion noted.  Lungs remain grossly clear.

## 2017-08-22 ENCOUNTER — Other Ambulatory Visit (HOSPITAL_COMMUNITY): Payer: Self-pay | Admitting: Research Study

## 2017-08-22 DIAGNOSIS — Z006 Encounter for examination for normal comparison and control in clinical research program: Secondary | ICD-10-CM

## 2017-08-23 ENCOUNTER — Encounter (HOSPITAL_COMMUNITY): Payer: Self-pay

## 2017-08-23 ENCOUNTER — Ambulatory Visit (HOSPITAL_COMMUNITY)
Admission: RE | Admit: 2017-08-23 | Discharge: 2017-08-23 | Disposition: A | Discharge status: Home / Self Care | Payer: Self-pay | Source: Ambulatory Visit | Attending: Research Study | Admitting: Research Study

## 2018-06-17 ENCOUNTER — Other Ambulatory Visit: Payer: Self-pay

## 2018-06-17 ENCOUNTER — Encounter (HOSPITAL_BASED_OUTPATIENT_CLINIC_OR_DEPARTMENT_OTHER): Payer: Self-pay | Admitting: *Deleted

## 2018-06-17 ENCOUNTER — Emergency Department (HOSPITAL_BASED_OUTPATIENT_CLINIC_OR_DEPARTMENT_OTHER)
Admission: EM | Admit: 2018-06-17 | Discharge: 2018-06-17 | Disposition: A | Discharge status: Home / Self Care | Payer: BLUE CROSS/BLUE SHIELD | Attending: Emergency Medicine | Admitting: Emergency Medicine

## 2018-06-17 DIAGNOSIS — S80811A Abrasion, right lower leg, initial encounter: Secondary | ICD-10-CM | POA: Insufficient documentation

## 2018-06-17 DIAGNOSIS — T148XXA Other injury of unspecified body region, initial encounter: Secondary | ICD-10-CM

## 2018-06-17 DIAGNOSIS — F17210 Nicotine dependence, cigarettes, uncomplicated: Secondary | ICD-10-CM | POA: Insufficient documentation

## 2018-06-17 DIAGNOSIS — Y9259 Other trade areas as the place of occurrence of the external cause: Secondary | ICD-10-CM | POA: Insufficient documentation

## 2018-06-17 DIAGNOSIS — W2209XA Striking against other stationary object, initial encounter: Secondary | ICD-10-CM | POA: Insufficient documentation

## 2018-06-17 DIAGNOSIS — Y9389 Activity, other specified: Secondary | ICD-10-CM | POA: Diagnosis not present

## 2018-06-17 DIAGNOSIS — Z23 Encounter for immunization: Secondary | ICD-10-CM | POA: Diagnosis not present

## 2018-06-17 DIAGNOSIS — Y990 Civilian activity done for income or pay: Secondary | ICD-10-CM | POA: Insufficient documentation

## 2018-06-17 MED ORDER — TETANUS-DIPHTH-ACELL PERTUSSIS 5-2.5-18.5 LF-MCG/0.5 IM SUSP
0.5000 mL | Freq: Once | INTRAMUSCULAR | Status: AC
  Administered 2018-06-17: 0.5 mL via INTRAMUSCULAR
  Filled 2018-06-17: qty 0.5

## 2018-06-17 NOTE — Discharge Instructions (Addendum)
Use water and soap and neosporin/bacitricin to wound, return if any think drainage from wound.

## 2018-06-17 NOTE — ED Notes (Signed)
ED Provider at bedside. 

## 2018-06-17 NOTE — ED Triage Notes (Signed)
Abrasion below his right knee. He hit his leg on a metal step while getting onto a machine.

## 2018-06-17 NOTE — ED Provider Notes (Signed)
[facility] EMERGENCY DEPARTMENT Provider Note   CSN: [id number] Arrival date & time: 06/17/18  1644     History   Chief Complaint Chief Complaint  Patient presents with  . Abrasion    HPI MAIJOR HORNIG is a 27 y.o. male.  The history is provided by the patient.  Illness  This is a new problem. The current episode started 6 to 12 hours ago. The problem occurs constantly. The problem has not changed since onset.Associated symptoms comments: Right shin abrasion, cut my metal stair at work. . Nothing aggravates the symptoms. Nothing relieves the symptoms. He has tried nothing for the symptoms. The treatment provided no relief.    History reviewed. No pertinent past medical history.  There are no active problems to display for this patient.   History reviewed. No pertinent surgical history.      Home Medications    Prior to Admission medications   Medication Sig Start Date End Date Taking? Authorizing Provider  famotidine (PEPCID) 20 MG tablet Take 1 tablet (20 mg total) by mouth 2 (two) times daily. 12/26/15   Elpidio Anis, PA-C  HYDROcodone-acetaminophen (NORCO/VICODIN) 5-325 MG tablet Take 1-2 tablets by mouth every 6 (six) hours as needed. 07/26/15   Roxy Horseman, PA-C  meloxicam (MOBIC) 7.5 MG tablet Take 1 tablet (7.5 mg total) by mouth daily. Patient not taking: Reported on 03/12/2015 02/17/14   Narda Bonds, MD  ondansetron (ZOFRAN ODT) 4 MG disintegrating tablet Take 1 tablet (4 mg total) by mouth every 8 (eight) hours as needed for nausea or vomiting. 03/12/15   Fayrene Helper, PA-C  polyethylene glycol powder (GLYCOLAX/MIRALAX) powder Take 17 g by mouth 2 (two) times daily. Until daily soft stools  OTC Patient not taking: Reported on 03/12/2015 02/26/15   Everlene Farrier, PA-C  potassium chloride (K-DUR) 10 MEQ tablet Take 1 tablet (10 mEq total) by mouth daily. Patient not taking: Reported on 03/12/2015 02/26/15   Everlene Farrier, PA-C     Family History No family history on file.  Social History Social History   Tobacco Use  . Smoking status: Current Every Day Smoker    Packs/day: 1.00  . Smokeless tobacco: Never Used  Substance Use Topics  . Alcohol use: Yes  . Drug use: No     Allergies   Patient has no known allergies.   Review of Systems Review of Systems  Musculoskeletal: Negative for arthralgias, back pain, gait problem, joint swelling, myalgias, neck pain and neck stiffness.  Skin: Positive for wound. Negative for color change, pallor and rash.     Physical Exam Updated Vital Signs BP 128/85   Pulse 89   Temp (!) 97.2 F (36.2 C) (Oral)   Resp 18   Ht 6\' 5"  (1.956 m)   Wt (!) 161.5 kg   SpO2 99%   BMI 42.22 kg/m   Physical Exam  Musculoskeletal: Normal range of motion. He exhibits no edema, tenderness or deformity.  Skin: Skin is warm and dry. Capillary refill takes less than 2 seconds. No rash noted.  Abrasion to right shin, no surrounding infection     ED Treatments / Results  Labs (all labs ordered are listed, but only abnormal results are displayed) Labs Reviewed - No data to display  EKG None  Radiology No results found.  Procedures Procedures (including critical care time)  Medications Ordered in ED Medications  Tdap (BOOSTRIX) injection 0.5 mL (0.5 mLs Intramuscular Given 06/17/18 1740)     Initial Impression /  Assessment and Plan / ED Course  I have reviewed the triage vital signs and the nursing notes.  Pertinent labs & imaging results that were available during my care of the patient were reviewed by me and considered in my medical decision making (see chart for details).     Polo Rileydrian D Meritt is a 27 year old male who presents to the ED with right shin abrasion.  Patient with normal vitals.  No fever.  Patient cut his right shin on a piece of metal stairs today at work. Superficial, no need for sutures.  Patient given tetanus shot.  Patient with wound  washed out and bacitracin ointment was placed.  Recommend keeping the wound nice and clean and using antibiotic ointment.  Given return precautions.  Discharged from ED in good condition.  This chart was dictated using voice recognition software.  Despite best efforts to proofread,  errors can occur which can change the documentation meaning.   Final Clinical Impressions(s) / ED Diagnoses   Final diagnoses:  Abrasion    ED Discharge Orders    None       Virgina NorfolkCuratolo, Heavyn Yearsley, DO 06/17/18 1751

## 2019-06-18 ENCOUNTER — Encounter (HOSPITAL_BASED_OUTPATIENT_CLINIC_OR_DEPARTMENT_OTHER): Payer: Self-pay | Admitting: Emergency Medicine

## 2019-06-18 ENCOUNTER — Emergency Department (HOSPITAL_BASED_OUTPATIENT_CLINIC_OR_DEPARTMENT_OTHER)
Admission: EM | Admit: 2019-06-18 | Discharge: 2019-06-18 | Disposition: A | Discharge status: Home / Self Care | Payer: BLUE CROSS/BLUE SHIELD | Attending: Emergency Medicine | Admitting: Emergency Medicine

## 2019-06-18 ENCOUNTER — Other Ambulatory Visit: Payer: Self-pay

## 2019-06-18 DIAGNOSIS — F17200 Nicotine dependence, unspecified, uncomplicated: Secondary | ICD-10-CM | POA: Diagnosis not present

## 2019-06-18 DIAGNOSIS — M79672 Pain in left foot: Secondary | ICD-10-CM | POA: Insufficient documentation

## 2019-06-18 DIAGNOSIS — X501XXA Overexertion from prolonged static or awkward postures, initial encounter: Secondary | ICD-10-CM | POA: Diagnosis not present

## 2019-06-18 MED ORDER — NAPROXEN 500 MG PO TABS: 500.0000 mg | ORAL_TABLET | Freq: Two times a day (BID) | ORAL | 0 refills | Status: DC

## 2019-06-18 MED FILL — NAPROXEN 500 MG TABS: 500 | 15 days supply | Qty: 30 | Fill #0

## 2019-06-18 NOTE — ED Provider Notes (Addendum)
[facility] EMERGENCY DEPARTMENT Provider Note   CSN: [id number] Arrival date & time: 06/18/19  1007     History   Chief Complaint Chief Complaint  Patient presents with  . Foot Pain    HPI [name] is a 28 y.o. male with no significant past medical history presents to the ED with a 2-week history of left foot discomfort.  Patient reports that he has a history of foot pain, bilaterally.  He denies any obvious trauma or injury to the foot.  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  He denies any gait changes, instability, ankle pain, knee pain, hip pain, injury to the foot, or other symptoms.  He has taken naproxen for his discomfort in the past, but reports that he is out of his medications.  Currently he denies any foot discomfort because he has been resting for the past couple of days, but is concerned for when he goes back to work.     HPI  History reviewed. No pertinent past medical history.  There are no active problems to display for this patient.   History reviewed. No pertinent surgical history.      Home Medications    Prior to Admission medications   Medication Sig Start Date End Date Taking? Authorizing Provider  naproxen (NAPROSYN) 500 MG tablet Take 1 tablet (500 mg total) by mouth 2 (two) times daily. 06/18/19   Corena Herter, PA-C    Family History No family history on file.  Social History Social History   Tobacco Use  . Smoking status: Current Every Day Smoker    Packs/day: 1.00  . Smokeless tobacco: Never Used  Substance Use Topics  . Alcohol use: Yes    Comment: once per week  . Drug use: No     Allergies   Patient has no known allergies.   Review of Systems Review of Systems  Musculoskeletal: Negative for gait problem and joint swelling.  Skin:  Negative for color change and wound.  Neurological: Negative for dizziness, weakness and numbness.     Physical Exam Updated Vital Signs BP (!) 138/94 (BP Location: Left Arm)   Pulse 96   Temp 97.6 F (36.4 C) (Oral)   Resp 16   Ht 6\' 5"  (1.956 m)   Wt (!) 149.6 kg   SpO2 99%   BMI 39.12 kg/m   Physical Exam Vitals signs and nursing note reviewed. Exam conducted with a chaperone present.  Constitutional:      Appearance: Normal appearance. He is obese.  HENT:     Head: Normocephalic and atraumatic.  Eyes:     General: No scleral icterus.    Conjunctiva/sclera: Conjunctivae normal.  Neck:     Musculoskeletal: Normal range of motion and neck supple.  Cardiovascular:     Rate and Rhythm: Normal rate and regular rhythm.     Pulses: Normal pulses.     Heart sounds: Normal heart sounds.  Pulmonary:     Effort: Pulmonary effort is normal.     Breath sounds: Normal breath sounds.  Musculoskeletal:     Comments: Left foot: No deformities, skin changes, or ecchymoses noted.  Cap refill intact.  Distal sensation intact.  Mild TTP in the calcaneal region, but no TTP elsewhere. Left ankle: ROM and strength fully intact. Left calf: Negative  Thompson test.  Achilles tendon intact. Left knee: ROM and strength fully intact.  Skin:    General: Skin is dry.     Capillary Refill: Capillary refill takes less than 2 seconds.  Neurological:     Mental Status: He is alert and oriented to person, place, and time.     GCS: GCS eye subscore is 4. GCS verbal subscore is 5. GCS motor subscore is 6.     Cranial Nerves: No cranial nerve deficit.     Sensory: No sensory deficit.     Gait: Gait normal.  Psychiatric:        Mood and Affect: Mood normal.        Behavior: Behavior normal.        Thought Content: Thought content normal.      ED Treatments / Results  Labs (all labs ordered are listed, but only abnormal results are displayed) Labs Reviewed - No data to display  EKG None   Radiology No results found.  Procedures Procedures (including critical care time)  Medications Ordered in ED Medications - No data to display   Initial Impression / Assessment and Plan / ED Course  I have reviewed the triage vital signs and the nursing notes.  Pertinent labs & imaging results that were available during my care of the patient were reviewed by me and considered in my medical decision making (see chart for details).        Patient's history and physical exam suggests plantar fasciitis versus Achilles tendinopathy.  Patient denies any injury or gait disturbance and mutually decided that plain films not warranted at this time.  He is neurovascularly intact.  Patient does not have a PCP, will refer to [facility] community health and wellness to be established with a clinician.  Recommend that he see orthopedics and/or physical therapist for ongoing evaluation and management of his chronic foot discomfort.  I also discussed seeing a podiatrist to see if he would benefit from inserts into the soles of his shoes.  Patient expressed desire to perhaps try Dr. Margart Sickles inserts for the time being to see if that improves his symptoms.  In the meantime, will prescribe patient naproxen as he reports that this has resolved his discomfort in the past.  Patient requests work note dating back to 3 days ago, but I informed that I can only provide him one for today.  Patient is understanding and agreeable to plan.  Return precautions discussed.    Final Clinical Impressions(s) / ED Diagnoses   Final diagnoses:  Foot pain, left    ED Discharge Orders         Ordered    naproxen (NAPROSYN) 500 MG tablet  2 times daily     06/18/19 1126           Lorelee New, PA-C 06/18/19 1131    Lorelee New, PA-C 06/18/19 1134    Sabas Sous, MD 06/19/19 [phone]

## 2019-06-18 NOTE — ED Triage Notes (Signed)
Pain in bottom of left foot that is now radiating up into his leg.  Sts he has had this issue for years and has been seen for it before and it went away but has come back in the last couple weeks.  Sts he took Naproxen last time and "something else" and it went away.  He tried his Naproxen this time but "it got old" so it didn't work.  He did not buy any new Naproxen. Pain is worse when he stands on the concrete at work.

## 2019-06-18 NOTE — Discharge Instructions (Addendum)
Please call [facility] community health and wellness to get established with a primary care provider for ongoing management of your health and wellbeing.  Please call Dr. Marlou Sa with [facility] orthopedics should you continue to experience ongoing foot discomfort despite naproxen for ongoing evaluation and management of your symptoms.

## 2019-06-18 NOTE — ED Notes (Signed)
ED Provider at bedside. 

## 2019-11-19 ENCOUNTER — Other Ambulatory Visit: Payer: Self-pay

## 2019-11-19 ENCOUNTER — Encounter (HOSPITAL_BASED_OUTPATIENT_CLINIC_OR_DEPARTMENT_OTHER): Payer: Self-pay

## 2019-11-19 ENCOUNTER — Emergency Department (HOSPITAL_BASED_OUTPATIENT_CLINIC_OR_DEPARTMENT_OTHER)
Admission: EM | Admit: 2019-11-19 | Discharge: 2019-11-19 | Disposition: A | Discharge status: Home / Self Care | Payer: BLUE CROSS/BLUE SHIELD | Attending: Emergency Medicine | Admitting: Emergency Medicine

## 2019-11-19 DIAGNOSIS — M5430 Sciatica, unspecified side: Secondary | ICD-10-CM | POA: Insufficient documentation

## 2019-11-19 DIAGNOSIS — Z5321 Procedure and treatment not carried out due to patient leaving prior to being seen by health care provider: Secondary | ICD-10-CM

## 2019-11-19 NOTE — ED Provider Notes (Cosign Needed)
Patient not in room. Nursing staff unable to locate in the waiting room. LWBS.   Felicie Morn, NP 11/19/19 1539

## 2019-11-19 NOTE — ED Triage Notes (Signed)
Pt c/o "sciatic pain" to right side x 1 months-NAD-steady gait

## 2020-01-07 ENCOUNTER — Encounter (HOSPITAL_BASED_OUTPATIENT_CLINIC_OR_DEPARTMENT_OTHER): Payer: Self-pay

## 2020-01-07 ENCOUNTER — Emergency Department (HOSPITAL_BASED_OUTPATIENT_CLINIC_OR_DEPARTMENT_OTHER)
Admission: EM | Admit: 2020-01-07 | Discharge: 2020-01-07 | Disposition: A | Discharge status: Home / Self Care | Payer: Self-pay | Attending: Emergency Medicine | Admitting: Emergency Medicine

## 2020-01-07 ENCOUNTER — Other Ambulatory Visit: Payer: Self-pay

## 2020-01-07 DIAGNOSIS — M79651 Pain in right thigh: Secondary | ICD-10-CM | POA: Insufficient documentation

## 2020-01-07 DIAGNOSIS — F17210 Nicotine dependence, cigarettes, uncomplicated: Secondary | ICD-10-CM | POA: Insufficient documentation

## 2020-01-07 DIAGNOSIS — M5431 Sciatica, right side: Secondary | ICD-10-CM | POA: Insufficient documentation

## 2020-01-07 MED ORDER — NAPROXEN 250 MG PO TABS
500.0000 mg | ORAL_TABLET | Freq: Once | ORAL | Status: AC
  Administered 2020-01-07: 500 mg via ORAL
  Filled 2020-01-07: qty 2

## 2020-01-07 MED ORDER — NAPROXEN 500 MG PO TABS: 500.0000 mg | ORAL_TABLET | Freq: Two times a day (BID) | ORAL | 0 refills | Status: DC

## 2020-01-07 MED ORDER — ACETAMINOPHEN 325 MG PO TABS
650.0000 mg | ORAL_TABLET | Freq: Once | ORAL | Status: AC
  Administered 2020-01-07: 650 mg via ORAL
  Filled 2020-01-07: qty 2

## 2020-01-07 NOTE — ED Triage Notes (Signed)
Pt c/o right LE pain since Oct 2020-denies injury-NAD-steady gait

## 2020-01-07 NOTE — ED Provider Notes (Signed)
[facility] EMERGENCY DEPARTMENT Provider Note   CSN: [id number] Arrival date & time: 01/07/20  2010     History Chief Complaint  Patient presents with  . Leg Pain    [name] is a 29 y.o. male.  The history is provided by the patient.  Leg Pain Location:  Hip and buttock Time since incident:  48 months Injury: no   Hip location:  R hip Buttock location:  R buttock Pain details:    Quality:  Cramping   Radiates to:  R leg   Severity:  Moderate   Onset quality:  Gradual   Timing:  Constant   Progression:  Unchanged Chronicity:  Chronic Dislocation: no   Foreign body present:  No foreign bodies Tetanus status:  Up to date Prior injury to area:  No Relieved by:  Nothing Worsened by:  Nothing Ineffective treatments:  None tried Associated symptoms: no back pain, no decreased ROM, no fatigue, no fever, no itching, no muscle weakness, no neck pain, no numbness, no stiffness, no swelling and no tingling   Risk factors: no concern for non-accidental trauma        History reviewed. No pertinent past medical history.  There are no problems to display for this patient.   History reviewed. No pertinent surgical history.     History reviewed. No pertinent family history.  Social History   Tobacco Use  . Smoking status: Current Every Day Smoker    Packs/day: 1.00  . Smokeless tobacco: Never Used  Substance Use Topics  . Alcohol use: Yes    Comment: weekly  . Drug use: No    Home Medications Prior to Admission medications   Medication Sig Start Date End Date Taking? Authorizing Provider  naproxen (NAPROSYN) 500 MG tablet Take 1 tablet (500 mg total) by mouth 2 (two) times daily with a meal. 01/07/20   Breeona Waid, MD    Allergies    Patient has no known allergies.  Review of Systems   Review of Systems  Constitutional: Negative for fatigue and fever.  HENT: Negative for congestion.   Eyes: Negative for visual disturbance.    Respiratory: Negative for shortness of breath.   Gastrointestinal: Negative for abdominal pain.  Genitourinary: Negative for difficulty urinating.  Musculoskeletal: Positive for arthralgias. Negative for back pain, neck pain and stiffness.  Skin: Negative for itching and wound.  Neurological: Negative for dizziness.  Psychiatric/Behavioral: Negative for agitation.  All other systems reviewed and are negative.   Physical Exam Updated Vital Signs BP (!) 147/90 (BP Location: Right Arm)   Pulse (!) 103   Temp 99 F (37.2 C) (Oral)   Resp 19   Ht 6\' 3"  (1.905 m)   Wt (!) 156.9 kg   SpO2 99%   BMI 43.25 kg/m   Physical Exam Vitals and nursing note reviewed.  Constitutional:      Appearance: Normal appearance.  HENT:     Head: Normocephalic and atraumatic.     Nose: Nose normal.  Eyes:     Conjunctiva/sclera: Conjunctivae normal.     Pupils: Pupils are equal, round, and reactive to light.  Cardiovascular:     Rate and Rhythm: Normal rate and regular rhythm.     Pulses: Normal pulses.     Heart sounds: Normal heart sounds.  Pulmonary:     Effort: Pulmonary effort is normal.     Breath sounds: Normal breath sounds.  Abdominal:     General: Abdomen is flat.  Tenderness: There is no abdominal tenderness. There is no guarding.  Musculoskeletal:        General: Normal range of motion.     Cervical back: Normal range of motion and neck supple.     Right lower leg: No edema.     Left lower leg: No edema.  Skin:    General: Skin is warm and dry.     Capillary Refill: Capillary refill takes less than 2 seconds.  Neurological:     General: No focal deficit present.     Mental Status: He is alert and oriented to person, place, and time.     Deep Tendon Reflexes: Reflexes normal.  Psychiatric:        Mood and Affect: Mood normal.        Behavior: Behavior normal.     ED Results / Procedures / Treatments   Labs (all labs ordered are listed, but only abnormal results are  displayed) Labs Reviewed - No data to display  EKG None  Radiology No results found.  Procedures Procedures (including critical care time)  Medications Ordered in ED Medications  naproxen (NAPROSYN) tablet 500 mg (has no administration in time range)  acetaminophen (TYLENOL) tablet 650 mg (has no administration in time range)    ED Course  I have reviewed the triage vital signs and the nursing notes.  Pertinent labs & imaging results that were available during my care of the patient were reviewed by me and considered in my medical decision making (see chart for details).  Symptoms consistent with sciatica.  Follow up with sports medicine.    [name] was evaluated in Emergency Department on 01/07/2020 for the symptoms described in the history of present illness. He was evaluated in the context of the global COVID-19 pandemic, which necessitated consideration that the patient might be at risk for infection with the SARS-CoV-2 virus that causes COVID-19. Institutional protocols and algorithms that pertain to the evaluation of patients at risk for COVID-19 are in a state of rapid change based on information released by regulatory bodies including the CDC and federal and state organizations. These policies and algorithms were followed during the patient's care in the ED.  Final Clinical Impression(s) / ED Diagnoses Final diagnoses:  Sciatica of right side   Return for intractable cough, coughing up blood,fevers >100.4 unrelieved by medication, shortness of breath, intractable vomiting, chest pain, shortness of breath, weakness,numbness, changes in speech, facial asymmetry,abdominal pain, passing out,Inability to tolerate liquids or food, cough, altered mental status or any concerns. No signs of systemic illness or infection. The patient is nontoxic-appearing on exam and vital signs are within normal limits.   I have reviewed the triage vital signs and the nursing  notes. Pertinent labs &imaging results that were available during my care of the patient were reviewed by me and considered in my medical decision making (see chart for details).After history, exam, and medical workup I feel the patient has beenappropriately medically screened and is safe for discharge home. Pertinent diagnoses were discussed with the patient. Patient was given return precautions.   Rx / DC Orders ED Discharge Orders         Ordered    naproxen (NAPROSYN) 500 MG tablet  2 times daily with meals     01/07/20 2315           Kailoni Vahle, MD 01/07/20 2318

## 2020-04-01 ENCOUNTER — Emergency Department (HOSPITAL_BASED_OUTPATIENT_CLINIC_OR_DEPARTMENT_OTHER)
Admission: EM | Admit: 2020-04-01 | Discharge: 2020-04-01 | Disposition: A | Discharge status: Home / Self Care | Payer: HRSA Program | Attending: Emergency Medicine | Admitting: Emergency Medicine

## 2020-04-01 ENCOUNTER — Other Ambulatory Visit: Payer: Self-pay

## 2020-04-01 ENCOUNTER — Encounter (HOSPITAL_BASED_OUTPATIENT_CLINIC_OR_DEPARTMENT_OTHER): Payer: Self-pay

## 2020-04-01 DIAGNOSIS — F17210 Nicotine dependence, cigarettes, uncomplicated: Secondary | ICD-10-CM | POA: Insufficient documentation

## 2020-04-01 DIAGNOSIS — U071 COVID-19: Secondary | ICD-10-CM | POA: Diagnosis not present

## 2020-04-01 DIAGNOSIS — J101 Influenza due to other identified influenza virus with other respiratory manifestations: Secondary | ICD-10-CM | POA: Diagnosis present

## 2020-04-01 LAB — RESP PANEL BY RT PCR (RSV, FLU A&B, COVID)
Influenza A by PCR: NEGATIVE
Influenza B by PCR: NEGATIVE
Respiratory Syncytial Virus by PCR: NEGATIVE
SARS Coronavirus 2 by RT PCR: POSITIVE — AB

## 2020-04-01 MED ORDER — CASIRIVIMAB-IMDEVIMAB 600-600 MG/10ML IJ SOLN
1200.0000 mg | Freq: Once | INTRAMUSCULAR | Status: AC
  Administered 2020-04-01: 1200 mg via INTRAVENOUS
  Filled 2020-04-01: qty 10

## 2020-04-01 MED ORDER — EPINEPHRINE 0.3 MG/0.3ML IJ SOAJ: 0.3000 mg | Freq: Once | INTRAMUSCULAR | Status: DC | PRN

## 2020-04-01 MED ORDER — METHYLPREDNISOLONE SODIUM SUCC 125 MG IJ SOLR: 125.0000 mg | Freq: Once | INTRAMUSCULAR | Status: DC | PRN

## 2020-04-01 MED ORDER — IBUPROFEN 800 MG PO TABS
800.0000 mg | ORAL_TABLET | Freq: Once | ORAL | Status: AC
  Administered 2020-04-01: 800 mg via ORAL
  Filled 2020-04-01: qty 1

## 2020-04-01 MED ORDER — FAMOTIDINE IN NACL 20-0.9 MG/50ML-% IV SOLN: 20.0000 mg | Freq: Once | INTRAVENOUS | Status: DC | PRN

## 2020-04-01 MED ORDER — DIPHENHYDRAMINE HCL 50 MG/ML IJ SOLN: 50.0000 mg | Freq: Once | INTRAMUSCULAR | Status: DC | PRN

## 2020-04-01 MED ORDER — SODIUM CHLORIDE 0.9 % IV SOLN
INTRAVENOUS | Status: DC | PRN
  Administered 2020-04-01: 500 mL via INTRAVENOUS

## 2020-04-01 MED ORDER — ALBUTEROL SULFATE HFA 108 (90 BASE) MCG/ACT IN AERS
2.0000 | INHALATION_SPRAY | Freq: Once | RESPIRATORY_TRACT | Status: AC | PRN
  Administered 2020-04-01: 2 via RESPIRATORY_TRACT
  Filled 2020-04-01: qty 6.7

## 2020-04-01 NOTE — ED Triage Notes (Signed)
Pt arrives with c/o exposure to RSV from a family friend pt has had cough, fever, chills, and body aches starting Monday night.

## 2020-04-01 NOTE — ED Provider Notes (Signed)
[facility] EMERGENCY DEPARTMENT Provider Note   CSN: [id number] Arrival date & time: 04/01/20  1140     History Chief Complaint  Patient presents with  . RSV Exposure    [name] is a 29 y.o. male  presents to the emergency department complaining of Flu-like sxs.  The onset of the symptoms was  gradual starting 4 days ago.  The patient has associated fever, cough, myalgias, fatigue, and decreased appetitie.  The symptoms have been  persistent, gradually worsened.  Nothing makes the symptoms worse and otc meds makes symptoms better.  The patient denies n/v/d.     HPI     History reviewed. No pertinent past medical history.  There are no problems to display for this patient.   History reviewed. No pertinent surgical history.     No family history on file.  Social History   Tobacco Use  . Smoking status: Current Every Day Smoker    Packs/day: 1.00    Types: Cigarettes  . Smokeless tobacco: Never Used  Vaping Use  . Vaping Use: Never used  Substance Use Topics  . Alcohol use: Yes    Comment: weekly  . Drug use: No    Home Medications Prior to Admission medications   Medication Sig Start Date End Date Taking? Authorizing Provider  naproxen (NAPROSYN) 500 MG tablet Take 1 tablet (500 mg total) by mouth 2 (two) times daily with a meal. 01/07/20   Palumbo, April, MD    Allergies    Patient has no known allergies.  Review of Systems   Review of Systems Ten systems reviewed and are negative for acute change, except as noted in the HPI.   Physical Exam Updated Vital Signs BP 107/79 (BP Location: Left Arm)   Pulse (!) 107   Temp 99.5 F (37.5 C) (Oral)   Resp 18   Ht 6\' 4"  (1.93 m)   Wt (!) 158.8 kg   SpO2 99%   BMI 42.60 kg/m   Physical Exam Vitals and nursing note reviewed.  Constitutional:      General: He is not in acute distress.    Appearance: He is well-developed. He is obese. He is ill-appearing. He is not diaphoretic.    HENT:     Head: Normocephalic and atraumatic.     Mouth/Throat:     Mouth: Mucous membranes are moist.  Eyes:     General: No scleral icterus.    Extraocular Movements: Extraocular movements intact.     Conjunctiva/sclera: Conjunctivae normal.     Pupils: Pupils are equal, round, and reactive to light.  Cardiovascular:     Rate and Rhythm: Normal rate and regular rhythm.     Heart sounds: Normal heart sounds.  Pulmonary:     Effort: Pulmonary effort is normal. No respiratory distress.     Breath sounds: Normal breath sounds. No wheezing or rales.  Abdominal:     Palpations: Abdomen is soft.     Tenderness: There is no abdominal tenderness.  Musculoskeletal:     Cervical back: Normal range of motion and neck supple.  Skin:    General: Skin is warm and dry.  Neurological:     General: No focal deficit present.     Mental Status: He is alert.  Psychiatric:        Behavior: Behavior normal.     ED Results / Procedures / Treatments   Labs (all labs ordered are listed, but only abnormal results are displayed) Labs Reviewed  RESP PANEL BY RT PCR (RSV, FLU A&B, COVID) - Abnormal; Notable for the following components:      Result Value   SARS Coronavirus 2 by RT PCR POSITIVE (*)    All other components within normal limits    EKG None  Radiology No results found.  Procedures Procedures (including critical care time)  Medications Ordered in ED Medications  casirivimab-imdevimab (REGEN-COV) 1,200 mg in sodium chloride 0.9 % 110 mL IVPB (has no administration in time range)  0.9 %  sodium chloride infusion (has no administration in time range)  diphenhydrAMINE (BENADRYL) injection 50 mg (has no administration in time range)  famotidine (PEPCID) IVPB 20 mg premix (has no administration in time range)  methylPREDNISolone sodium succinate (SOLU-MEDROL) 125 mg/2 mL injection 125 mg (has no administration in time range)  albuterol (VENTOLIN HFA) 108 (90 Base) MCG/ACT inhaler  2 puff (has no administration in time range)  EPINEPHrine (EPI-PEN) injection 0.3 mg (has no administration in time range)    ED Course  I have reviewed the triage vital signs and the nursing notes.  Pertinent labs & imaging results that were available during my care of the patient were reviewed by me and considered in my medical decision making (see chart for details).    MDM Rules/Calculators/A&P                          Patient here with FLU like sxs. Testing is POSITIVE FOR COVID 51 Family members are also postitive. Patient will receive MAB infusions here in the ER. Meets Criteria due to BMI.  Patient has received the MAB and fusion.  Doing well.  Fever defervesced since with antipyretic medication.  No hypoxia.  Patient appears appropriate for discharge at this time with home isolation precautions   ALFREDDIE Hernandez was evaluated in Emergency Department on 04/01/2020 for the symptoms described in the history of present illness. He was evaluated in the context of the global COVID-19 pandemic, which necessitated consideration that the patient might be at risk for infection with the SARS-CoV-2 virus that causes COVID-19. Institutional protocols and algorithms that pertain to the evaluation of patients at risk for COVID-19 are in a state of rapid change based on information released by regulatory bodies including the CDC and federal and state organizations. These policies and algorithms were followed during the patient's care in the ED.  Final Clinical Impression(s) / ED Diagnoses Final diagnoses:  None    Rx / DC Orders ED Discharge Orders    None       Arthor Captain, PA-C 04/01/20 1944    Pricilla Loveless, MD 04/02/20 [phone]

## 2020-04-01 NOTE — Discharge Instructions (Signed)
Person Under Monitoring Name: [name]  Location: [address] [place] [address]   Infection Prevention Recommendations for Individuals Confirmed to have, or Being Evaluated for, 2019 Novel Coronavirus (COVID-19) Infection Who Receive Care at Home  Individuals who are confirmed to have, or are being evaluated for, COVID-19 should follow the prevention steps below until a healthcare provider or local or state health department says they can return to normal activities.  Stay home except to get medical care You should restrict activities outside your home, except for getting medical care. Do not go to work, school, or public areas, and do not use public transportation or taxis.  Call ahead before visiting your doctor Before your medical appointment, call the healthcare provider and tell them that you have, or are being evaluated for, COVID-19 infection. This will help the healthcare provider's office take steps to keep other people from getting infected. Ask your healthcare provider to call the local or state health department.  Monitor your symptoms Seek prompt medical attention if your illness is worsening (e.g., difficulty breathing). Before going to your medical appointment, call the healthcare provider and tell them that you have, or are being evaluated for, COVID-19 infection. Ask your healthcare provider to call the local or state health department.  Wear a facemask You should wear a facemask that covers your nose and mouth when you are in the same room with other people and when you visit a healthcare provider. People who live with or visit you should also wear a facemask while they are in the same room with you.  Separate yourself from other people in your home As much as possible, you should stay in a different room from other people in your home. Also, you should use a separate bathroom, if available.  Avoid sharing household items You should  not share dishes, drinking glasses, cups, eating utensils, towels, bedding, or other items with other people in your home. After using these items, you should wash them thoroughly with soap and water.  Cover your coughs and sneezes Cover your mouth and nose with a tissue when you cough or sneeze, or you can cough or sneeze into your sleeve. Throw used tissues in a lined trash can, and immediately wash your hands with soap and water for at least 20 seconds or use an alcohol-based hand rub.  Wash your [employer] your hands often and thoroughly with soap and water for at least 20 seconds. You can use an alcohol-based hand sanitizer if soap and water are not available and if your hands are not visibly dirty. Avoid touching your eyes, nose, and mouth with unwashed hands.   Prevention Steps for Caregivers and Household Members of Individuals Confirmed to have, or Being Evaluated for, COVID-19 Infection Being Cared for in the Home  If you live with, or provide care at home for, a person confirmed to have, or being evaluated for, COVID-19 infection please follow these guidelines to prevent infection:  Follow healthcare provider's instructions Make sure that you understand and can help the patient follow any healthcare provider instructions for all care.  Provide for the patient's basic needs You should help the patient with basic needs in the home and provide support for getting groceries, prescriptions, and other personal needs.  Monitor the patient's symptoms If they are getting sicker, call his or her medical provider and tell them that the patient has, or is being evaluated for, COVID-19 infection. This will help the healthcare provider's  office take steps to keep other people from getting infected. Ask the healthcare provider to call the local or state health department.  Limit the number of people who have contact with the patient If possible, have only one caregiver for the  patient. Other household members should stay in another home or place of residence. If this is not possible, they should stay in another room, or be separated from the patient as much as possible. Use a separate bathroom, if available. Restrict visitors who do not have an essential need to be in the home.  Keep older adults, very young children, and other sick people away from the patient Keep older adults, very young children, and those who have compromised immune systems or chronic health conditions away from the patient. This includes people with chronic heart, lung, or kidney conditions, diabetes, and cancer.  Ensure good ventilation Make sure that shared spaces in the home have good air flow, such as from an air conditioner or an opened window, weather permitting.  Wash your hands often Wash your hands often and thoroughly with soap and water for at least 20 seconds. You can use an alcohol based hand sanitizer if soap and water are not available and if your hands are not visibly dirty. Avoid touching your eyes, nose, and mouth with unwashed hands. Use disposable paper towels to dry your hands. If not available, use dedicated cloth towels and replace them when they become wet.  Wear a facemask and gloves Wear a disposable facemask at all times in the room and gloves when you touch or have contact with the patient's blood, body fluids, and/or secretions or excretions, such as sweat, saliva, sputum, nasal mucus, vomit, urine, or feces.  Ensure the mask fits over your nose and mouth tightly, and do not touch it during use. Throw out disposable facemasks and gloves after using them. Do not reuse. Wash your hands immediately after removing your facemask and gloves. If your personal clothing becomes contaminated, carefully remove clothing and launder. Wash your hands after handling contaminated clothing. Place all used disposable facemasks, gloves, and other waste in a lined container before  disposing them with other household waste. Remove gloves and wash your hands immediately after handling these items.  Do not share dishes, glasses, or other household items with the patient Avoid sharing household items. You should not share dishes, drinking glasses, cups, eating utensils, towels, bedding, or other items with a patient who is confirmed to have, or being evaluated for, COVID-19 infection. After the person uses these items, you should wash them thoroughly with soap and water.  Wash laundry thoroughly Immediately remove and wash clothes or bedding that have blood, body fluids, and/or secretions or excretions, such as sweat, saliva, sputum, nasal mucus, vomit, urine, or feces, on them. Wear gloves when handling laundry from the patient. Read and follow directions on labels of laundry or clothing items and detergent. In general, wash and dry with the warmest temperatures recommended on the label.  Clean all areas the individual has used often Clean all touchable surfaces, such as counters, tabletops, doorknobs, bathroom fixtures, toilets, phones, keyboards, tablets, and bedside tables, every day. Also, clean any surfaces that may have blood, body fluids, and/or secretions or excretions on them. Wear gloves when cleaning surfaces the patient has come in contact with. Use a diluted bleach solution (e.g., dilute bleach with 1 part bleach and 10 parts water) or a household disinfectant with a label that says EPA-registered for coronaviruses. To make a  bleach solution at home, add 1 tablespoon of bleach to 1 quart (4 cups) of water. For a larger supply, add  cup of bleach to 1 gallon (16 cups) of water. Read labels of cleaning products and follow recommendations provided on product labels. Labels contain instructions for safe and effective use of the cleaning product including precautions you should take when applying the product, such as wearing gloves or eye protection and making sure you  have good ventilation during use of the product. Remove gloves and wash hands immediately after cleaning.  Monitor yourself for signs and symptoms of illness Caregivers and household members are considered close contacts, should monitor their health, and will be asked to limit movement outside of the home to the extent possible. Follow the monitoring steps for close contacts listed on the symptom monitoring form.   ? If you have additional questions, contact your local health department or call the epidemiologist on call at [phone] (available 24/7). ? This guidance is subject to change. For the most up-to-date guidance from [facility], please refer to their website: [email or web address].pl

## 2020-05-23 ENCOUNTER — Encounter (HOSPITAL_COMMUNITY): Payer: Self-pay

## 2020-05-23 ENCOUNTER — Emergency Department (HOSPITAL_COMMUNITY)
Admission: EM | Admit: 2020-05-23 | Discharge: 2020-05-24 | Disposition: A | Discharge status: Home / Self Care | Payer: Self-pay | Attending: Emergency Medicine | Admitting: Emergency Medicine

## 2020-05-23 ENCOUNTER — Emergency Department (HOSPITAL_COMMUNITY): Payer: Self-pay

## 2020-05-23 ENCOUNTER — Other Ambulatory Visit: Payer: Self-pay

## 2020-05-23 DIAGNOSIS — R0789 Other chest pain: Secondary | ICD-10-CM | POA: Insufficient documentation

## 2020-05-23 DIAGNOSIS — I252 Old myocardial infarction: Secondary | ICD-10-CM | POA: Insufficient documentation

## 2020-05-23 DIAGNOSIS — F17210 Nicotine dependence, cigarettes, uncomplicated: Secondary | ICD-10-CM | POA: Insufficient documentation

## 2020-05-23 DIAGNOSIS — F1410 Cocaine abuse, uncomplicated: Secondary | ICD-10-CM | POA: Insufficient documentation

## 2020-05-23 HISTORY — DX: Acute myocardial infarction, unspecified: I21.9

## 2020-05-23 LAB — CBC WITH DIFFERENTIAL/PLATELET
Abs Immature Granulocytes: 0.01 10*3/uL (ref 0.00–0.07)
Basophils Absolute: 0 10*3/uL (ref 0.0–0.1)
Basophils Relative: 1 %
Eosinophils Absolute: 0.2 10*3/uL (ref 0.0–0.5)
Eosinophils Relative: 2 %
HCT: 48.8 % (ref 39.0–52.0)
Hemoglobin: 15.4 g/dL (ref 13.0–17.0)
Immature Granulocytes: 0 %
Lymphocytes Relative: 54 %
Lymphs Abs: 3.8 10*3/uL (ref 0.7–4.0)
MCH: 26.3 pg (ref 26.0–34.0)
MCHC: 31.6 g/dL (ref 30.0–36.0)
MCV: 83.4 fL (ref 80.0–100.0)
Monocytes Absolute: 0.6 10*3/uL (ref 0.1–1.0)
Monocytes Relative: 9 %
Neutro Abs: 2.3 10*3/uL (ref 1.7–7.7)
Neutrophils Relative %: 34 %
Platelets: 354 10*3/uL (ref 150–400)
RBC: 5.85 MIL/uL — ABNORMAL HIGH (ref 4.22–5.81)
RDW: 16.1 % — ABNORMAL HIGH (ref 11.5–15.5)
WBC: 6.9 10*3/uL (ref 4.0–10.5)
nRBC: 0 % (ref 0.0–0.2)

## 2020-05-23 LAB — TROPONIN I (HIGH SENSITIVITY): Troponin I (High Sensitivity): 5 ng/L (ref <18)

## 2020-05-23 LAB — COMPREHENSIVE METABOLIC PANEL
ALT: 51 U/L — ABNORMAL HIGH (ref 0–44)
AST: 39 U/L (ref 15–41)
Albumin: 3.8 g/dL (ref 3.5–5.0)
Alkaline Phosphatase: 56 U/L (ref 38–126)
Anion gap: 13 (ref 5–15)
BUN: <5 mg/dL — ABNORMAL LOW (ref 6–20)
CO2: 22 mmol/L (ref 22–32)
Calcium: 9.2 mg/dL (ref 8.9–10.3)
Chloride: 105 mmol/L (ref 98–111)
Creatinine, Ser: 0.85 mg/dL (ref 0.61–1.24)
GFR, Estimated: >60 mL/min (ref >60)
Glucose, Bld: 136 mg/dL — ABNORMAL HIGH (ref 70–99)
Potassium: 3.4 mmol/L — ABNORMAL LOW (ref 3.5–5.1)
Sodium: 140 mmol/L (ref 135–145)
Total Bilirubin: 0.8 mg/dL (ref 0.3–1.2)
Total Protein: 7.5 g/dL (ref 6.5–8.1)

## 2020-05-23 MED ORDER — SODIUM CHLORIDE 0.9 % IV SOLN: INTRAVENOUS | Status: DC

## 2020-05-23 MED ORDER — LORAZEPAM 2 MG/ML IJ SOLN
1.0000 mg | Freq: Once | INTRAMUSCULAR | Status: AC
  Administered 2020-05-23: 1 mg via INTRAVENOUS
  Filled 2020-05-23: qty 1

## 2020-05-23 NOTE — ED Provider Notes (Addendum)
[facility] EMERGENCY DEPARTMENT Provider Note   CSN: [id number] Arrival date & time: 05/23/20  2119     History Chief Complaint  Patient presents with  . Chest Pain    [name] is a 29 y.o. male.  29 year old male with history of cocaine abuse and reported MI per patient presents with chest pain that started several hours after he used cocaine.  Patient states that he was at rest and also developed discomfort on left side of his chest that is worse with certain positions.  Denies any cough or congestion.  No fever or chills.  Pain lasted for an hour and has since resolved.  No treatment used prior to arrival        Past Medical History:  Diagnosis Date  . MI (myocardial infarction) (HCC)    Reported by patient    There are no problems to display for this patient.   History reviewed. No pertinent surgical history.     History reviewed. No pertinent family history.  Social History   Tobacco Use  . Smoking status: Current Every Day Smoker    Packs/day: 1.00    Types: Cigarettes  . Smokeless tobacco: Never Used  Vaping Use  . Vaping Use: Never used  Substance Use Topics  . Alcohol use: Yes    Comment: weekly  . Drug use: Yes    Types: Cocaine    Home Medications Prior to Admission medications   Medication Sig Start Date End Date Taking? Authorizing Provider  naproxen (NAPROSYN) 500 MG tablet Take 1 tablet (500 mg total) by mouth 2 (two) times daily with a meal. Patient not taking: Reported on 05/23/2020 01/07/20   Nicanor Alcon, April, MD    Allergies    Patient has no known allergies.  Review of Systems   Review of Systems  All other systems reviewed and are negative.   Physical Exam Updated Vital Signs BP (!) 144/106 (BP Location: Right Arm)   Pulse 93   Temp 97.6 F (36.4 C) (Oral)   Resp (!) 24   Ht 1.93 m (6\' 4" )   Wt (!) 158 kg   SpO2 96%   BMI 42.40 kg/m   Physical Exam Vitals and nursing note reviewed.    Constitutional:      General: He is not in acute distress.    Appearance: Normal appearance. He is well-developed. He is not toxic-appearing.  HENT:     Head: Normocephalic and atraumatic.  Eyes:     General: Lids are normal.     Conjunctiva/sclera: Conjunctivae normal.     Pupils: Pupils are equal, round, and reactive to light.  Neck:     Thyroid: No thyroid mass.     Trachea: No tracheal deviation.  Cardiovascular:     Rate and Rhythm: Normal rate and regular rhythm.     Heart sounds: Normal heart sounds. No murmur heard.  No gallop.   Pulmonary:     Effort: Pulmonary effort is normal. No respiratory distress.     Breath sounds: Normal breath sounds. No stridor. No decreased breath sounds, wheezing, rhonchi or rales.  Abdominal:     General: Bowel sounds are normal. There is no distension.     Palpations: Abdomen is soft.     Tenderness: There is no abdominal tenderness. There is no rebound.  Musculoskeletal:        General: No tenderness. Normal range of motion.     Cervical back: Normal range of motion and neck  supple.  Skin:    General: Skin is warm and dry.     Findings: No abrasion or rash.  Neurological:     Mental Status: He is alert and oriented to person, place, and time.     GCS: GCS eye subscore is 4. GCS verbal subscore is 5. GCS motor subscore is 6.     Cranial Nerves: No cranial nerve deficit.     Sensory: No sensory deficit.  Psychiatric:        Speech: Speech normal.        Behavior: Behavior normal.     ED Results / Procedures / Treatments   Labs (all labs ordered are listed, but only abnormal results are displayed) Labs Reviewed  CBC WITH DIFFERENTIAL/PLATELET  COMPREHENSIVE METABOLIC PANEL  TROPONIN I (HIGH SENSITIVITY)    EKG EKG Interpretation  Date/Time:  Sunday May 23 2020 21:27:23 EDT Ventricular Rate:  89 PR Interval:    QRS Duration: 83 QT Interval:  352 QTC Calculation: 429 R Axis:   46 Text Interpretation: Sinus rhythm  Confirmed by Lorre Nick ([id number]) on 05/23/2020 10:00:53 PM   Radiology No results found.  Procedures Procedures (including critical care time)  Medications Ordered in ED Medications  0.9 %  sodium chloride infusion (has no administration in time range)  LORazepam (ATIVAN) injection 1 mg (has no administration in time range)    ED Course  I have reviewed the triage vital signs and the nursing notes.  Pertinent labs & imaging results that were available during my care of the patient were reviewed by me and considered in my medical decision making (see chart for details).    MDM Rules/Calculators/A&P                          Patient is here to me that he had MI before back at [facility].  Review of the old records does not support that as patient was admitted for overnight observation but had a negative echo as well as flat cardiac enzymes.  That admission was in March 2021.  Patient's pain is very atypical for ACS.  His pain was not associate with cocaine use but started several hours later.  It does appear to be musculoskeletal as it is positional.  Will perform delta troponin and have given patient Ativan and will likely discharge.  Care signed out to next provider Final Clinical Impression(s) / ED Diagnoses Final diagnoses:  None    Rx / DC Orders ED Discharge Orders    None       Lorre Nick, MD 05/23/20 2231    Lorre Nick, MD 05/23/20 0240    Lorre Nick, MD 05/23/20 2234

## 2020-05-23 NOTE — ED Triage Notes (Signed)
Pt coming with EMS from home with c/o CP that feels similar to a prior MI per pt. Pt used cocaine today and also has a hx of alcohol abuse. Pt with x1 emesis. ETOH on board.

## 2020-05-24 LAB — TROPONIN I (HIGH SENSITIVITY): Troponin I (High Sensitivity): 5 ng/L (ref <18)

## 2020-05-24 NOTE — ED Provider Notes (Signed)
I assumed care of this patient.  Please see previous provider note for further details of Hx, PE.  Briefly patient is a 29 y.o. male who presented positional chest pain worse with movement and palpation after using cocaine. Pending delta trop.   Trop negative.  The patient appears reasonably screened and/or stabilized for discharge and I doubt any other medical condition or other [facility] requiring further screening, evaluation, or treatment in the ED at this time prior to discharge. Safe for discharge with strict return precautions.  Disposition: Discharge  Condition: Good  I have discussed the results, Dx and Tx plan with the patient/family who expressed understanding and agree(s) with the plan. Discharge instructions discussed at length. The patient/family was given strict return precautions who verbalized understanding of the instructions. No further questions at time of discharge.    ED Discharge Orders    None       Follow Up: Primary care provider  Schedule an appointment as soon as possible for a visit         Madisynn Plair, Amadeo Garnet, MD 05/24/20 [phone]

## 2020-11-10 ENCOUNTER — Emergency Department (HOSPITAL_BASED_OUTPATIENT_CLINIC_OR_DEPARTMENT_OTHER)
Admission: EM | Admit: 2020-11-10 | Discharge: 2020-11-10 | Disposition: A | Discharge status: Home / Self Care | Payer: Self-pay | Attending: Emergency Medicine | Admitting: Emergency Medicine

## 2020-11-10 ENCOUNTER — Other Ambulatory Visit: Payer: Self-pay

## 2020-11-10 ENCOUNTER — Emergency Department (HOSPITAL_BASED_OUTPATIENT_CLINIC_OR_DEPARTMENT_OTHER): Payer: Self-pay

## 2020-11-10 ENCOUNTER — Encounter (HOSPITAL_BASED_OUTPATIENT_CLINIC_OR_DEPARTMENT_OTHER): Payer: Self-pay

## 2020-11-10 DIAGNOSIS — R059 Cough, unspecified: Secondary | ICD-10-CM

## 2020-11-10 DIAGNOSIS — F17200 Nicotine dependence, unspecified, uncomplicated: Secondary | ICD-10-CM | POA: Insufficient documentation

## 2020-11-10 DIAGNOSIS — R7309 Other abnormal glucose: Secondary | ICD-10-CM | POA: Insufficient documentation

## 2020-11-10 DIAGNOSIS — J069 Acute upper respiratory infection, unspecified: Secondary | ICD-10-CM | POA: Insufficient documentation

## 2020-11-10 DIAGNOSIS — Z20822 Contact with and (suspected) exposure to covid-19: Secondary | ICD-10-CM | POA: Insufficient documentation

## 2020-11-10 DIAGNOSIS — Z28310 Unvaccinated for covid-19: Secondary | ICD-10-CM | POA: Insufficient documentation

## 2020-11-10 LAB — SARS CORONAVIRUS 2 (TAT 6-24 HRS): SARS Coronavirus 2: NEGATIVE

## 2020-11-10 LAB — CBG MONITORING, ED: Glucose-Capillary: 172 mg/dL — ABNORMAL HIGH (ref 70–99)

## 2020-11-10 MED ORDER — FLUTICASONE PROPIONATE 50 MCG/ACT NA SUSP: 2.0000 | Freq: Every day | NASAL | 0 refills | Status: DC

## 2020-11-10 MED ORDER — GUAIFENESIN ER 600 MG PO TB12: 600.0000 mg | ORAL_TABLET | Freq: Two times a day (BID) | ORAL | 0 refills | Status: DC

## 2020-11-10 MED ORDER — BENZONATATE 100 MG PO CAPS: 100.0000 mg | ORAL_CAPSULE | Freq: Three times a day (TID) | ORAL | 0 refills | Status: AC

## 2020-11-10 NOTE — ED Provider Notes (Signed)
[facility] EMERGENCY DEPARTMENT Provider Note   CSN: [id number] Arrival date & time: 11/10/20  1025     History Chief Complaint  Patient presents with  . Cough    [name] is a 30 y.o. male.  HPI   Pt is a 30 y/o M who presents to the ED today for eval of URI sxs that started 3 days ago. Pt reports rhinorrhea, congestion, fevers, cough, diarrhea, and nausea. He further reports body aches. He denies any headache.  States his children have had similar sxs. None of them have been tested for COVID. The patient has not been vaccinated.   Past Medical History:  Diagnosis Date  . MI (myocardial infarction) (HCC)    Reported by patient    There are no problems to display for this patient.   History reviewed. No pertinent surgical history.     History reviewed. No pertinent family history.  Social History   Tobacco Use  . Smoking status: Current Every Day Smoker    Packs/day: 1.00    Types: Cigarettes  . Smokeless tobacco: Never Used  Vaping Use  . Vaping Use: Never used  Substance Use Topics  . Alcohol use: Yes    Comment: weekly  . Drug use: Yes    Types: Cocaine    Home Medications Prior to Admission medications   Medication Sig Start Date End Date Taking? Authorizing Provider  benzonatate (TESSALON) 100 MG capsule Take 1 capsule (100 mg total) by mouth every 8 (eight) hours for 5 days. 11/10/20 11/15/20 Yes Deavin Forst S, PA-C  fluticasone (FLONASE) 50 MCG/ACT nasal spray Place 2 sprays into both nostrils daily. 11/10/20  Yes Chinenye Katzenberger S, PA-C  guaiFENesin (MUCINEX) 600 MG 12 hr tablet Take 1 tablet (600 mg total) by mouth 2 (two) times daily. 11/10/20  Yes Nashla Althoff S, PA-C  naproxen (NAPROSYN) 500 MG tablet Take 1 tablet (500 mg total) by mouth 2 (two) times daily with a meal. Patient not taking: Reported on 05/23/2020 01/07/20   Nicanor Alcon, April, MD    Allergies    Patient has no known allergies.  Review of Systems    Review of Systems  Constitutional: Positive for chills, diaphoresis and fever.  HENT: Positive for congestion and rhinorrhea. Negative for ear pain and sore throat.   Eyes: Negative for visual disturbance.  Respiratory: Positive for cough.   Cardiovascular: Negative for chest pain.  Gastrointestinal: Positive for diarrhea and nausea. Negative for abdominal pain and vomiting.  Genitourinary: Negative for dysuria and hematuria.  Musculoskeletal: Positive for myalgias. Negative for back pain.  Skin: Negative for rash.  Neurological: Negative for headaches.  All other systems reviewed and are negative.   Physical Exam Updated Vital Signs BP 131/85   Pulse 93   Temp 99.1 F (37.3 C) (Oral)   Resp 18   Ht 6\' 5"  (1.956 m)   Wt (!) 158.8 kg   SpO2 100%   BMI 41.50 kg/m   Physical Exam Vitals and nursing note reviewed.  Constitutional:      Appearance: He is well-developed.  HENT:     Head: Normocephalic and atraumatic.     Nose: Congestion and rhinorrhea present.  Eyes:     Conjunctiva/sclera: Conjunctivae normal.  Cardiovascular:     Rate and Rhythm: Normal rate and regular rhythm.     Heart sounds: Normal heart sounds. No murmur heard.   Pulmonary:     Effort: Pulmonary effort is normal. No respiratory distress.  Breath sounds: Normal breath sounds. No wheezing, rhonchi or rales.  Abdominal:     General: Bowel sounds are normal.     Palpations: Abdomen is soft.     Tenderness: There is no abdominal tenderness. There is no guarding or rebound.  Musculoskeletal:     Cervical back: Neck supple.  Skin:    General: Skin is warm and dry.  Neurological:     Mental Status: He is alert.     ED Results / Procedures / Treatments   Labs (all labs ordered are listed, but only abnormal results are displayed) Labs Reviewed  CBG MONITORING, ED - Abnormal; Notable for the following components:      Result Value   Glucose-Capillary 172 (*)    All other components within  normal limits  SARS CORONAVIRUS 2 (TAT 6-24 HRS)    EKG None  Radiology DG Chest Port 1 View  Result Date: 11/10/2020 CLINICAL DATA:  Cough EXAM: PORTABLE CHEST 1 VIEW COMPARISON:  05/23/2020 FINDINGS: Heart and mediastinal contours are within normal limits. No focal opacities or effusions. No acute bony abnormality. IMPRESSION: No active disease. Electronically Signed   By: Charlett Nose M.D.   On: 11/10/2020 11:32    Procedures Procedures   Medications Ordered in ED Medications - No data to display  ED Course  I have reviewed the triage vital signs and the nursing notes.  Pertinent labs & imaging results that were available during my care of the patient were reviewed by me and considered in my medical decision making (see chart for details).    MDM Rules/Calculators/A&P                          Patient presenting for evaluation for URI sxs and diarrhea.  Reports symptoms ongoing for a few days.  Patient nontoxic, well-appearing, no distress.  Vital signs are reassuring.  Chest x-ray neg for pna or other abnomrality.  Tested for Covid in the ED. Results pending at discharge. Pt requesting we check his CBG, results 172, this is not a fasted glucose. He has no sxs of DKA and siginficant hyperglycemia to warrant further labwork at this time.. Advised on quarantine measures if covid test is positive. Will give Rx for symptomatic management. Advised on f/u and return precautions. Pt voiced understanding of the plan and reasons to return. All questions answered, pt stable for d/c.  [name] was evaluated in Emergency Department on 11/10/2020 for the symptoms described in the history of present illness. He was evaluated in the context of the global COVID-19 pandemic, which necessitated consideration that the patient might be at risk for infection with the SARS-CoV-2 virus that causes COVID-19. Institutional protocols and algorithms that pertain to the evaluation of patients at risk  for COVID-19 are in a state of rapid change based on information released by regulatory bodies including the CDC and federal and state organizations. These policies and algorithms were followed during the patient's care in the ED.   Final Clinical Impression(s) / ED Diagnoses Final diagnoses:  Viral URI with cough  Elevated random blood glucose level    Rx / DC Orders ED Discharge Orders         Ordered    fluticasone (FLONASE) 50 MCG/ACT nasal spray  Daily        11/10/20 1137    guaiFENesin (MUCINEX) 600 MG 12 hr tablet  2 times daily        11/10/20 1137  benzonatate (TESSALON) 100 MG capsule  Every 8 hours        11/10/20 1137           Zebbie Ace S, PA-C 11/10/20 1137    Alvira Monday, MD 11/12/20 [phone]

## 2020-11-10 NOTE — ED Notes (Signed)
Patient verbalized understanding of dc instructions, prescriptions, follow up referrals and reasons to return to ER for reevaluation.  

## 2020-11-10 NOTE — Discharge Instructions (Signed)
Take fluticasone, mucinex and tessalon as directed.   Your covid test will result in 24 hours. Please follow up on mychart to check the result. If positive, you should be isolated for at least 5 days since the onset of your symptoms AND >72 hours after symptoms resolution (absence of fever without the use of fever reducing medicaiton and improvement in respiratory symptoms), whichever is longer  Please follow up with your primary care provider within 5-7 days for re-evaluation of your symptoms. If you do not have a primary care provider, information for a healthcare clinic has been provided for you to make arrangements for follow up care. Please return to the emergency department for any new or worsening symptoms.

## 2020-11-10 NOTE — ED Triage Notes (Addendum)
Cough started Sunday night, congestion, subjective fever, Diarrhea x 2 last night, neck pain started last night denies injury.  Patient also had blood work done for a clinical study a month ago and was told that his glucose was high and he needed to get it checked out.

## 2021-03-23 ENCOUNTER — Other Ambulatory Visit: Payer: Self-pay

## 2021-03-23 ENCOUNTER — Encounter (HOSPITAL_BASED_OUTPATIENT_CLINIC_OR_DEPARTMENT_OTHER): Payer: Self-pay

## 2021-03-23 ENCOUNTER — Emergency Department (HOSPITAL_BASED_OUTPATIENT_CLINIC_OR_DEPARTMENT_OTHER)
Admission: EM | Admit: 2021-03-23 | Discharge: 2021-03-23 | Disposition: A | Discharge status: Home / Self Care | Payer: Self-pay | Attending: Emergency Medicine | Admitting: Emergency Medicine

## 2021-03-23 DIAGNOSIS — F17290 Nicotine dependence, other tobacco product, uncomplicated: Secondary | ICD-10-CM | POA: Insufficient documentation

## 2021-03-23 DIAGNOSIS — H00011 Hordeolum externum right upper eyelid: Secondary | ICD-10-CM | POA: Insufficient documentation

## 2021-03-23 MED ORDER — ERYTHROMYCIN 5 MG/GM OP OINT
TOPICAL_OINTMENT | Freq: Once | OPHTHALMIC | Status: AC
  Filled 2021-03-23: qty 3.5

## 2021-03-23 NOTE — ED Provider Notes (Signed)
[facility] EMERGENCY DEPARTMENT Provider Note   CSN: [id number] Arrival date & time: 03/23/21  1548     History Chief Complaint  Patient presents with   Bump to eyelid    [name] is a 30 y.o. male.  Patient is a 30 year old male with a reported history of having a prior MI who is presenting today with complaints of a lump on his right eyelid that has been there approximately 1 year but has gradually gotten bigger in the last few weeks.  He denies any pain, redness.  He does not have a foreign body sensation to his eye.  His vision is normal.  He does not have insurance or a regular doctor and started looking on the Internet and was hoping something could be done to make it better.  He denies any recent trauma to the area.  He has not been putting anything on it.  Occasionally it will drain but it has not drained in the last 2 weeks since it has been enlarging.  The history is provided by the patient.      Past Medical History:  Diagnosis Date   MI (myocardial infarction) (HCC)    Reported by patient    There are no problems to display for this patient.   History reviewed. No pertinent surgical history.     No family history on file.  Social History   Tobacco Use   Smoking status: Some Days    Types: Cigars   Smokeless tobacco: Never  Vaping Use   Vaping Use: Every day  Substance Use Topics   Alcohol use: Not Currently   Drug use: Not Currently    Types: Cocaine    Home Medications Prior to Admission medications   Medication Sig Start Date End Date Taking? Authorizing Provider  fluticasone (FLONASE) 50 MCG/ACT nasal spray Place 2 sprays into both nostrils daily. 11/10/20   Couture, Cortni S, PA-C  guaiFENesin (MUCINEX) 600 MG 12 hr tablet Take 1 tablet (600 mg total) by mouth 2 (two) times daily. 11/10/20   Couture, Cortni S, PA-C  naproxen (NAPROSYN) 500 MG tablet Take 1 tablet (500 mg total) by mouth 2 (two) times daily with a  meal. Patient not taking: Reported on 05/23/2020 01/07/20   Nicanor Alcon, April, MD    Allergies    Patient has no known allergies.  Review of Systems   Review of Systems  All other systems reviewed and are negative.  Physical Exam Updated Vital Signs BP (!) 135/91 (BP Location: Left Arm)   Pulse 72   Temp 98.5 F (36.9 C) (Oral)   Resp 16   Ht 6\' 4"  (1.93 m)   Wt (!) 159.7 kg   SpO2 98%   BMI 42.85 kg/m   Physical Exam Vitals and nursing note reviewed.  Constitutional:      General: He is not in acute distress.    Appearance: Normal appearance.  HENT:     Head: Normocephalic.     Nose: Nose normal.     Mouth/Throat:     Mouth: Mucous membranes are moist.  Eyes:     General: Vision grossly intact.     Extraocular Movements: Extraocular movements intact.     Conjunctiva/sclera: Conjunctivae normal.     Pupils: Pupils are equal, round, and reactive to light.   Cardiovascular:     Rate and Rhythm: Normal rate.  Pulmonary:     Effort: Pulmonary effort is normal.  Neurological:     Mental  Status: He is alert and oriented to person, place, and time. Mental status is at baseline.  Psychiatric:        Mood and Affect: Mood normal.    ED Results / Procedures / Treatments   Labs (all labs ordered are listed, but only abnormal results are displayed) Labs Reviewed - No data to display  EKG None  Radiology No results found.  Procedures Procedures   Medications Ordered in ED Medications  erythromycin ophthalmic ointment (has no administration in time range)    ED Course  I have reviewed the triage vital signs and the nursing notes.  Pertinent labs & imaging results that were available during my care of the patient were reviewed by me and considered in my medical decision making (see chart for details).    MDM Rules/Calculators/A&P                           Patient presenting with a stye on the right upper lid with no conjunctival involvement.  No significant  erythema and no pointing or draining.  This is been there for approximately 1 year and has mildly enlarged over the last few weeks but feel that it will need surgical and excision in the future.  Patient does not have a PCP.  Will have transition of care contact patient to help him get a PCP and then may be orange card so that he can have this taken care of.  At this time is not affecting his vision and causing no pain Final Clinical Impression(s) / ED Diagnoses Final diagnoses:  Hordeolum externum of right upper eyelid    Rx / DC Orders ED Discharge Orders     None        Gwyneth Sprout, MD 03/23/21 1620

## 2021-03-23 NOTE — Care Management (Signed)
ED CM spoke with the patient via telephone after he was discharged. He confirms the need for a PCP. Provided with the contact information for [employer] [facility]. He is currently uninsured. He reports having transportation to [facility]. Message sent to [facility].

## 2021-03-23 NOTE — ED Triage Notes (Signed)
Pt c/o bump to right upper eyelid x 1 year-worse x 3 weeks-NAD-steady gait

## 2021-03-23 NOTE — Discharge Instructions (Addendum)
Try the antibiotic ointment 3 times a day and use warm and cold compresses

## 2021-03-24 ENCOUNTER — Telehealth: Payer: Self-pay

## 2021-03-24 NOTE — Telephone Encounter (Signed)
Message received from Rubie Maid, RN CM requesting an appointment for patient. He has no PCP and needs to establish care. Call placed to # [phone], message left with call back requested to this CM.  Call placed to other number listed for patient # [phone]. The person who answered stated that this was a wrong number and it was removed from Epic.

## 2021-03-28 ENCOUNTER — Ambulatory Visit: Payer: Self-pay

## 2021-03-28 NOTE — Telephone Encounter (Signed)
Pt refused triage stating that the ED informed him to apply warm compresses and ointment. Pt stated he has no questions. Reason for Disposition  Caller has already spoken with the PCP and has no further questions.  Answer Assessment - Initial Assessment Questions Pt called [facility] this am on Community line- advised by pt that he did not have any questions and refused triage.  Protocols used: No Contact or Duplicate Contact Call-A-AH

## 2021-04-07 ENCOUNTER — Telehealth: Payer: Self-pay

## 2021-04-07 NOTE — Telephone Encounter (Signed)
Call placed to patient to discuss follow up from ED visit as well as establishing care.  He said he does not have a PCP and is currently uninsured.  Explained to him that he can establish care at one of the [facility] and be referred to a specialist as needed.  He can also apply for [employer] and the [employer] to assist with medical costs.  He said he was interested in going to the mobile medical unit.  Informed him that the next location  for MMU will be at [facility] on 04/11/2021. Explained times MMU is available as well as first come, first served policy, no appointment needed.  Provided him with  # [phone] to call  if he is interested in other dates/locations.  Also instructed him to call this clinic back if he has further questions.

## 2021-06-06 ENCOUNTER — Emergency Department (HOSPITAL_BASED_OUTPATIENT_CLINIC_OR_DEPARTMENT_OTHER)
Admission: EM | Admit: 2021-06-06 | Discharge: 2021-06-06 | Disposition: A | Discharge status: Home / Self Care | Payer: Self-pay | Attending: Emergency Medicine | Admitting: Emergency Medicine

## 2021-06-06 ENCOUNTER — Other Ambulatory Visit: Payer: Self-pay

## 2021-06-06 ENCOUNTER — Encounter (HOSPITAL_BASED_OUTPATIENT_CLINIC_OR_DEPARTMENT_OTHER): Payer: Self-pay | Admitting: *Deleted

## 2021-06-06 DIAGNOSIS — Z20828 Contact with and (suspected) exposure to other viral communicable diseases: Secondary | ICD-10-CM | POA: Insufficient documentation

## 2021-06-06 DIAGNOSIS — J3489 Other specified disorders of nose and nasal sinuses: Secondary | ICD-10-CM | POA: Insufficient documentation

## 2021-06-06 DIAGNOSIS — R000 Tachycardia, unspecified: Secondary | ICD-10-CM | POA: Insufficient documentation

## 2021-06-06 DIAGNOSIS — R519 Headache, unspecified: Secondary | ICD-10-CM | POA: Insufficient documentation

## 2021-06-06 DIAGNOSIS — R5383 Other fatigue: Secondary | ICD-10-CM | POA: Insufficient documentation

## 2021-06-06 DIAGNOSIS — R509 Fever, unspecified: Secondary | ICD-10-CM | POA: Insufficient documentation

## 2021-06-06 DIAGNOSIS — Z20822 Contact with and (suspected) exposure to covid-19: Secondary | ICD-10-CM | POA: Insufficient documentation

## 2021-06-06 DIAGNOSIS — M791 Myalgia, unspecified site: Secondary | ICD-10-CM | POA: Insufficient documentation

## 2021-06-06 DIAGNOSIS — R197 Diarrhea, unspecified: Secondary | ICD-10-CM | POA: Insufficient documentation

## 2021-06-06 DIAGNOSIS — F17290 Nicotine dependence, other tobacco product, uncomplicated: Secondary | ICD-10-CM | POA: Insufficient documentation

## 2021-06-06 LAB — RESP PANEL BY RT-PCR (FLU A&B, COVID) ARPGX2
Influenza A by PCR: NEGATIVE
Influenza B by PCR: NEGATIVE
SARS Coronavirus 2 by RT PCR: NEGATIVE

## 2021-06-06 MED ORDER — OSELTAMIVIR PHOSPHATE 75 MG PO CAPS: 75.0000 mg | ORAL_CAPSULE | Freq: Two times a day (BID) | ORAL | 0 refills | Status: DC

## 2021-06-06 NOTE — Discharge Instructions (Addendum)
You were seen in today after flu exposure.  Although your flu and COVID test were negative, you will be treated with Tamiflu, which is an antiviral medication, to lower the duration of symptoms.  Please make sure to drink plenty of fluids, mainly water.  The flu is contagious, so make sure to stay inside away from people.  Take Tylenol and/or Motrin as needed for fevers and body aches.  Additionally, you can take cough and cold medication although do not combine with Tylenol Motrin as these already contain these active ingredients and may cause overdose.  If you have any concern, new or worsening symptoms, please return to the nearest emergency department.

## 2021-06-06 NOTE — ED Provider Notes (Signed)
[facility] EMERGENCY DEPARTMENT Provider Note   CSN: [id number] Arrival date & time: 06/06/21  1805     History Chief Complaint  Patient presents with   flu exposure     [name] is a 30 y.o. male seen emergency department with less than 24 hours of myalgias, fatigue, subjective fever, headache, rhinorrhea, and diarrhea.  He denies any cough, nasal congestion, chest pain, shortness of breath, vomiting, nausea, or abdominal pain.  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  Denies any medical or surgical history.  Denies any daily medications.  No known drug allergies.  Denies any tobacco, EtOH, or illicit drug use.  HPI     Past Medical History:  Diagnosis Date   MI (myocardial infarction) (HCC)    Reported by patient    There are no problems to display for this patient.   History reviewed. No pertinent surgical history.     No family history on file.  Social History   Tobacco Use   Smoking status: Some Days    Types: Cigars   Smokeless tobacco: Never  Vaping Use   Vaping Use: Every day  Substance Use Topics   Alcohol use: Not Currently   Drug use: Not Currently    Types: Cocaine    Home Medications Prior to Admission medications   Medication Sig Start Date End Date Taking? Authorizing Provider  oseltamivir (TAMIFLU) 75 MG capsule Take 1 capsule (75 mg total) by mouth every 12 (twelve) hours. 06/06/21  Yes Achille Rich, PA-C  fluticasone [facility]) 50 MCG/ACT nasal spray Place 2 sprays into both nostrils daily. 11/10/20   Couture, Cortni S, PA-C  guaiFENesin (MUCINEX) 600 MG 12 hr tablet Take 1 tablet (600 mg total) by mouth 2 (two) times daily. 11/10/20   Couture, Cortni S, PA-C  naproxen (NAPROSYN) 500 MG tablet Take 1 tablet (500 mg total) by mouth 2 (two) times daily with a meal. Patient not taking: Reported on 05/23/2020 01/07/20   Nicanor Alcon, April, MD     Allergies    Patient has no known allergies.  Review of Systems   Review of Systems  Constitutional:  Positive for fatigue and fever (subjective). Negative for chills.  HENT:  Positive for rhinorrhea. Negative for congestion, ear pain and sore throat.   Eyes:  Negative for pain and visual disturbance.  Respiratory:  Negative for cough and shortness of breath.   Cardiovascular:  Negative for chest pain and palpitations.  Gastrointestinal:  Positive for diarrhea. Negative for abdominal pain, nausea and vomiting.  Genitourinary:  Negative for dysuria and hematuria.  Musculoskeletal:  Negative for arthralgias and back pain.  Skin:  Negative for color change and rash.  Neurological:  Positive for headaches. Negative for seizures and syncope.  All other systems reviewed and are negative.  Physical Exam Updated Vital Signs BP 122/79 (BP Location: Left Arm)   Pulse (!) 101   Temp 100 F (37.8 C) (Oral)   Resp (!) 22   Ht 6\' 4"  (1.93 m)   Wt (!) 158.8 kg   SpO2 97%   BMI 42.60 kg/m   Physical Exam Vitals and nursing note reviewed.  Constitutional:      General: He is not in acute distress.    Appearance: Normal appearance. He is not toxic-appearing.  HENT:     Head: Normocephalic and atraumatic.     Right Ear: Tympanic membrane, ear canal and  external ear normal.     Left Ear: Tympanic membrane, ear canal and external ear normal.     Nose:     Comments: Mild erythema and edema to bilateral nasal turbinates with scant clear nasal discharge.    Mouth/Throat:     Mouth: Mucous membranes are moist.     Pharynx: Oropharynx is clear. No oropharyngeal exudate or posterior oropharyngeal erythema.     Comments: ACS membranes.  No pharyngeal erythema, edema, or exudate noted.  Uvula midline.  Airway patent. Eyes:     General: No scleral icterus. Cardiovascular:     Rate and Rhythm: Regular rhythm. Tachycardia present.  Pulmonary:     Effort: Pulmonary effort is normal. No  respiratory distress.     Breath sounds: Normal breath sounds. No wheezing or rhonchi.  Abdominal:     General: Abdomen is flat. Bowel sounds are normal.     Palpations: Abdomen is soft.     Tenderness: There is no guarding or rebound.  Musculoskeletal:        General: No deformity.     Cervical back: Normal range of motion.  Skin:    General: Skin is warm and dry.  Neurological:     General: No focal deficit present.     Mental Status: He is alert. Mental status is at baseline.     Gait: Gait normal.    ED Results / Procedures / Treatments   Labs (all labs ordered are listed, but only abnormal results are displayed) Labs Reviewed  RESP PANEL BY RT-PCR (FLU A&B, COVID) [id number]    EKG None  Radiology No results found.  Procedures Procedures   Medications Ordered in ED Medications - No data to display  ED Course  I have reviewed the triage vital signs and the nursing notes.  Pertinent labs & imaging results that were available during my care of the patient were reviewed by me and considered in my medical decision making (see chart for details).  50 -year-old male presents emergency department for less than 24 hours of flulike symptoms.  Physical exam benign other than mild turbinate edema and erythema. Flu and COVID test negative.  Patient's with known flu exposure.  Will treat with Tamiflu course for 5 days due to high suspicion of influenza, but symptom duration limits sensitivity of test.  Discussed this with patient.  Discussed that the flu is contagious and that the patient will need to quarantine away from others.  Recommended Tylenol and/or Motrin as needed for body aches and fever.  Work note provided.  Return precautions given.  Patient agrees to plan.  Patient is stable being discharged home in good condition.    MDM Rules/Calculators/A&P  Final Clinical Impression(s) / ED Diagnoses Final diagnoses:  Exposure to the flu    Rx / DC Orders ED Discharge Orders           Ordered    oseltamivir (TAMIFLU) 75 MG capsule  Every 12 hours        06/06/21 2111             Sherrell Puller, PA-C 06/06/21 2119    Drenda Freeze, MD 06/06/21 [phone]

## 2021-06-06 NOTE — ED Triage Notes (Signed)
Flu exposure , with sx fever , body aches x 1 day

## 2021-07-20 ENCOUNTER — Other Ambulatory Visit: Payer: Self-pay

## 2021-07-20 ENCOUNTER — Encounter (HOSPITAL_BASED_OUTPATIENT_CLINIC_OR_DEPARTMENT_OTHER): Payer: Self-pay

## 2021-07-20 ENCOUNTER — Emergency Department (HOSPITAL_BASED_OUTPATIENT_CLINIC_OR_DEPARTMENT_OTHER)
Admission: EM | Admit: 2021-07-20 | Discharge: 2021-07-20 | Disposition: A | Discharge status: Home / Self Care | Payer: Self-pay | Attending: Emergency Medicine | Admitting: Emergency Medicine

## 2021-07-20 DIAGNOSIS — F17210 Nicotine dependence, cigarettes, uncomplicated: Secondary | ICD-10-CM | POA: Insufficient documentation

## 2021-07-20 DIAGNOSIS — J069 Acute upper respiratory infection, unspecified: Secondary | ICD-10-CM | POA: Insufficient documentation

## 2021-07-20 DIAGNOSIS — J3489 Other specified disorders of nose and nasal sinuses: Secondary | ICD-10-CM | POA: Insufficient documentation

## 2021-07-20 DIAGNOSIS — Z20822 Contact with and (suspected) exposure to covid-19: Secondary | ICD-10-CM | POA: Insufficient documentation

## 2021-07-20 DIAGNOSIS — J019 Acute sinusitis, unspecified: Secondary | ICD-10-CM | POA: Insufficient documentation

## 2021-07-20 LAB — RESP PANEL BY RT-PCR (FLU A&B, COVID) ARPGX2
Influenza A by PCR: NEGATIVE
Influenza B by PCR: NEGATIVE
SARS Coronavirus 2 by RT PCR: NEGATIVE

## 2021-07-20 MED ORDER — AMOXICILLIN-POT CLAVULANATE 875-125 MG PO TABS: 1.0000 | ORAL_TABLET | Freq: Two times a day (BID) | ORAL | 0 refills | Status: AC

## 2021-07-20 MED ORDER — DEXAMETHASONE 4 MG PO TABS
10.0000 mg | ORAL_TABLET | Freq: Once | ORAL | Status: AC
  Administered 2021-07-20: 13:00:00 10 mg via ORAL
  Filled 2021-07-20: qty 3

## 2021-07-20 NOTE — Discharge Instructions (Addendum)
If your viral testing is negative fill antibiotic.  You have been given a long-acting steroid to help with your symptoms as well.

## 2021-07-20 NOTE — ED Triage Notes (Addendum)
Pt c/o URI sx, left eye swelling x 2 weeks-states "I think I have a sinus infection"-NAD-steady gait

## 2021-07-20 NOTE — ED Provider Notes (Signed)
[facility] EMERGENCY DEPARTMENT Provider Note   CSN: [id number] Arrival date & time: 07/20/21  1211     History Chief Complaint  Patient presents with   URI    [name] is a 30 y.o. male.  The history is provided by the patient.  URI Presenting symptoms: congestion   Presenting symptoms: no cough, no ear pain, no fever and no sore throat   Severity:  Mild Onset quality:  Gradual Duration:  2 weeks Timing:  Constant Progression:  Unchanged Chronicity:  New Relieved by:  Nothing Worsened by:  Nothing Associated symptoms: sinus pain   Associated symptoms: no arthralgias, no headaches, no myalgias, no neck pain, no sneezing, no swollen glands and no wheezing   Risk factors: no sick contacts       Past Medical History:  Diagnosis Date   MI (myocardial infarction) (HCC)    Reported by patient    There are no problems to display for this patient.   History reviewed. No pertinent surgical history.     No family history on file.  Social History   Tobacco Use   Smoking status: Some Days    Types: Cigars, Cigarettes   Smokeless tobacco: Never  Vaping Use   Vaping Use: Some days  Substance Use Topics   Alcohol use: Not Currently   Drug use: Not Currently    Home Medications Prior to Admission medications   Medication Sig Start Date End Date Taking? Authorizing Provider  amoxicillin-clavulanate (AUGMENTIN) 875-125 MG tablet Take 1 tablet by mouth every 12 (twelve) hours for 10 days. 07/20/21 07/30/21 Yes Shamera Yarberry, DO  fluticasone (FLONASE) 50 MCG/ACT nasal spray Place 2 sprays into both nostrils daily. 11/10/20   Couture, Cortni S, PA-C  guaiFENesin (MUCINEX) 600 MG 12 hr tablet Take 1 tablet (600 mg total) by mouth 2 (two) times daily. 11/10/20   Couture, Cortni S, PA-C  naproxen (NAPROSYN) 500 MG tablet Take 1 tablet (500 mg total) by mouth 2 (two) times daily with a meal. Patient not taking: Reported on 05/23/2020 01/07/20   Palumbo,  April, MD  oseltamivir (TAMIFLU) 75 MG capsule Take 1 capsule (75 mg total) by mouth every 12 (twelve) hours. 06/06/21   Achille Rich, PA-C    Allergies    Patient has no known allergies.  Review of Systems   Review of Systems  Constitutional:  Negative for chills and fever.  HENT:  Positive for congestion and sinus pain. Negative for ear pain, sneezing and sore throat.   Eyes:  Negative for pain and visual disturbance.  Respiratory:  Negative for cough, shortness of breath and wheezing.   Cardiovascular:  Negative for chest pain and palpitations.  Gastrointestinal:  Negative for abdominal pain and vomiting.  Genitourinary:  Negative for dysuria and hematuria.  Musculoskeletal:  Negative for arthralgias, back pain, myalgias and neck pain.  Skin:  Negative for color change and rash.  Neurological:  Negative for seizures, syncope and headaches.  All other systems reviewed and are negative.  Physical Exam Updated Vital Signs BP 124/75 (BP Location: Left Arm)    Pulse (!) 107    Temp 98.3 F (36.8 C) (Oral)    Resp 20    Ht 6\' 5"  (1.956 m)    Wt (!) 164.7 kg    SpO2 92%    BMI 43.05 kg/m   Physical Exam Vitals and nursing note reviewed.  Constitutional:      General: He is not in acute distress.  Appearance: He is well-developed. He is not ill-appearing.  HENT:     Head: Normocephalic and atraumatic.     Nose: Congestion and rhinorrhea present.  Eyes:     Conjunctiva/sclera: Conjunctivae normal.     Pupils: Pupils are equal, round, and reactive to light.  Cardiovascular:     Rate and Rhythm: Normal rate and regular rhythm.     Heart sounds: No murmur heard. Pulmonary:     Effort: Pulmonary effort is normal. No respiratory distress.     Breath sounds: Normal breath sounds.  Abdominal:     Palpations: Abdomen is soft.     Tenderness: There is no abdominal tenderness.  Musculoskeletal:     Cervical back: Neck supple.  Neurological:     Mental Status: He is alert.    ED  Results / Procedures / Treatments   Labs (all labs ordered are listed, but only abnormal results are displayed) Labs Reviewed  RESP PANEL BY RT-PCR (FLU A&B, COVID) [id number]    EKG None  Radiology No results found.  Procedures Procedures   Medications Ordered in ED Medications  dexamethasone (DECADRON) tablet 10 mg (has no administration in time range)    ED Course  I have reviewed the triage vital signs and the nursing notes.  Pertinent labs & imaging results that were available during my care of the patient were reviewed by me and considered in my medical decision making (see chart for details).    MDM Rules/Calculators/A&P                          [name] is here with sinus congestion for the last 2 weeks.  Unremarkable vitals.  No fever.  Will swab for COVID and flu.  If this is negative recommend he take antibiotics.  Patient given a dose of Decadron.  Overall he appears well.  No signs of throat infection on exam.  Lots of nasal congestion and rhinorrhea on exam.  No signs of facial cellulitis or periorbital cellulitis.  Discharged in good condition.  Understands return precautions.  This chart was dictated using voice recognition software.  Despite best efforts to proofread,  errors can occur which can change the documentation meaning.      Final Clinical Impression(s) / ED Diagnoses Final diagnoses:  Acute sinusitis, recurrence not specified, unspecified location    Rx / DC Orders ED Discharge Orders          Ordered    amoxicillin-clavulanate (AUGMENTIN) 875-125 MG tablet  Every 12 hours        07/20/21 1231             Virgina Norfolk, DO 07/20/21 1233

## 2021-10-20 ENCOUNTER — Ambulatory Visit: Payer: Self-pay | Admitting: *Deleted

## 2021-10-20 NOTE — Telephone Encounter (Signed)
?  Chief Complaint: eye swelling ?Symptoms: duct blocked ?Frequency: constant ?Pertinent Negatives: Patient denies fever ?Disposition: [] ED /[x] Urgent Care (no appt availability in office) / [] Appointment(In office/virtual)/ []  [facility]/ [] Home Care/ [] Refused Recommended Disposition /[] [facility]/ []  Follow-up with PCP ?Additional Notes: Pt has been to ED and received antibiotic before. He states he is going to . ? ?Reason for Disposition ? [1] SEVERE eyelid swelling on one side AND [2] red and painful (or tender to touch) ? ?Answer Assessment - Initial Assessment Questions ?1. ONSET: "When did the swelling start?" (e.g., minutes, hours, days) ?    Days ago ?2. LOCATION: "What part of the eyelids is swollen?" ?    Whole eye and part of face ?3. SEVERITY: "How swollen is it?" ?    Duct stopped up ?4. ITCHING: "Is there any itching?" If Yes, ask: "How much?"   (Scale 1-10; mild, moderate or severe) ?    no ?5. PAIN: "Is the swelling painful to touch?" If Yes, ask: "How painful is it?"   (Scale 1-10; mild, moderate or severe) ?    painful ?6. FEVER: "Do you have a fever?" If Yes, ask: "What is it, how was it measured, and when did it start?"  ?    no ?7. CAUSE: "What do you think is causing the swelling?" ?    Stopped up ducy ?8. RECURRENT SYMPTOM: "Have you had eyelid swelling before?" If Yes, ask: "When was the last time?" "What happened that time?" ?    Yes, got antibiotic, did not work great ? ?Protocols used: Eye - Swelling-A-AH ? ?

## 2021-10-22 ENCOUNTER — Emergency Department (HOSPITAL_BASED_OUTPATIENT_CLINIC_OR_DEPARTMENT_OTHER)
Admission: EM | Admit: 2021-10-22 | Discharge: 2021-10-22 | Disposition: A | Discharge status: Home / Self Care | Payer: Self-pay | Attending: Emergency Medicine | Admitting: Emergency Medicine

## 2021-10-22 ENCOUNTER — Other Ambulatory Visit: Payer: Self-pay

## 2021-10-22 ENCOUNTER — Encounter (HOSPITAL_BASED_OUTPATIENT_CLINIC_OR_DEPARTMENT_OTHER): Payer: Self-pay | Admitting: Emergency Medicine

## 2021-10-22 DIAGNOSIS — H00011 Hordeolum externum right upper eyelid: Secondary | ICD-10-CM

## 2021-10-22 MED ORDER — ERYTHROMYCIN 5 MG/GM OP OINT
TOPICAL_OINTMENT | Freq: Once | OPHTHALMIC | Status: AC
  Administered 2021-10-22: 1 via OPHTHALMIC
  Filled 2021-10-22: qty 3.5

## 2021-10-22 NOTE — ED Triage Notes (Signed)
Having issues with right eye.  Seen previously for the same.  Had been on antibiotics previously.  Reports he tried to follow up with [facility]. ?

## 2021-10-22 NOTE — ED Provider Notes (Signed)
?[facility] EMERGENCY DEPARTMENT ?Provider Note ? ? ?CSN: [id number] ?Arrival date & time: 10/22/21  1919 ? ?  ? ?History ? ?Chief Complaint  ?Patient presents with  ? Eye Problem  ? ? ?[name] is a 31 y.o. male history of previous MI, presenting with right eyelid swelling.  Patient was seen here several months ago diagnosed with stye of the right eye.  Patient initial course of erythromycin.  He states that the erythromycin helps initially and recently its been more swollen.  He has a left lower erythromycin ointment and he tries to use it with minimal relief.  He also called [facility] and they sent him to the ER.  Patient has not seen ophthalmology for this problem yet.  ? ?The history is provided by the patient.  ? ?  ? ?Home Medications ?Prior to Admission medications   ?Medication Sig Start Date End Date Taking? Authorizing Provider  ?fluticasone (FLONASE) 50 MCG/ACT nasal spray Place 2 sprays into both nostrils daily. 11/10/20   Couture, Cortni S, PA-C  ?guaiFENesin (MUCINEX) 600 MG 12 hr tablet Take 1 tablet (600 mg total) by mouth 2 (two) times daily. 11/10/20   Couture, Cortni S, PA-C  ?naproxen (NAPROSYN) 500 MG tablet Take 1 tablet (500 mg total) by mouth 2 (two) times daily with a meal. ?Patient not taking: Reported on 05/23/2020 01/07/20   Palumbo, April, MD  ?oseltamivir (TAMIFLU) 75 MG capsule Take 1 capsule (75 mg total) by mouth every 12 (twelve) hours. 06/06/21   Achille Rich, PA-C  ?   ? ?Allergies    ?Patient has no known allergies.   ? ?Review of Systems   ?Review of Systems  ?HENT:    ?     Right eyelid swelling   ?All other systems reviewed and are negative. ? ?Physical Exam ?Updated Vital Signs ?BP 121/88 (BP Location: Left Arm)   Pulse (!) 107   Temp 98.9 ?F (37.2 ?C) (Oral)   Resp 20   Ht 6\' 3"  (1.905 m)   Wt (!) 163.3 kg   SpO2 100%   BMI 45.00 kg/m?  ?Physical Exam ?Vitals and nursing note reviewed.  ?Constitutional:   ?   Appearance: Normal  appearance.  ?HENT:  ?   Head: Normocephalic.  ?   Nose: Nose normal.  ?   Mouth/Throat:  ?   Mouth: Mucous membranes are moist.  ?Eyes:  ?   Comments: Patient has right upper eyelid swelling consistent with hordeolum.  Patient has no obvious conjunctivitis.  ?Cardiovascular:  ?   Rate and Rhythm: Normal rate.  ?   Pulses: Normal pulses.  ?Pulmonary:  ?   Effort: Pulmonary effort is normal.  ?Abdominal:  ?   General: Abdomen is flat.  ?Musculoskeletal:     ?   General: Normal range of motion.  ?   Cervical back: Normal range of motion.  ?Skin: ?   General: Skin is warm.  ?   Capillary Refill: Capillary refill takes less than 2 seconds.  ?Neurological:  ?   General: No focal deficit present.  ?   Mental Status: He is alert and oriented to person, place, and time.  ?Psychiatric:     ?   Behavior: Behavior normal.  ? ? ?ED Results / Procedures / Treatments   ?Labs ?(all labs ordered are listed, but only abnormal results are displayed) ?Labs Reviewed - No data to display ? ?EKG ?None ? ?Radiology ?No results found. ? ?Procedures ?Procedures  ? ? ?  Medications Ordered in ED ?Medications  ?erythromycin ophthalmic ointment (has no administration in time range)  ? ? ?ED Course/ Medical Decision Making/ A&P ?  ?                        ?Medical Decision Making ?[name] is a 31 y.o. male here with stye in the right eye.  It initially improved with erythromycin ointment and now came back.  He has not follow-up with ophthalmology due to insurance issues.  I left a message with case manager.  I also referred to ophthalmology outpatient.  Will give erythromycin ointment for now. ? ? ?Problems Addressed: ?Hordeolum externum of right upper eyelid: acute illness or injury ? ?Risk ?Prescription drug management. ? ?Final Clinical Impression(s) / ED Diagnoses ?Final diagnoses:  ?Hordeolum externum of right upper eyelid  ? ? ?Rx / DC Orders ?ED Discharge Orders   ? ? None  ? ?  ? ? ?  ?Charlynne Pander, MD ?10/22/21 1954 ? ?

## 2021-10-22 NOTE — Discharge Instructions (Addendum)
You have a stye in the right eye.  Please use erythromycin twice daily for a week. ? ?You need to call ophthalmology to get an appointment. ? ?I also contacted social worker to follow-up with you regarding your insurance status. ? ?Return to ER if you have worse right eyelid swelling, blurry vision, purulent drainage  ?

## 2021-11-20 IMAGING — DX DG CHEST 1V PORT
1 series · 1 of 1 positions shown · non-contrast
Comparison: 05/23/2020

CLINICAL DATA: Cough

EXAM:
PORTABLE CHEST 1 VIEW

[chest ap]
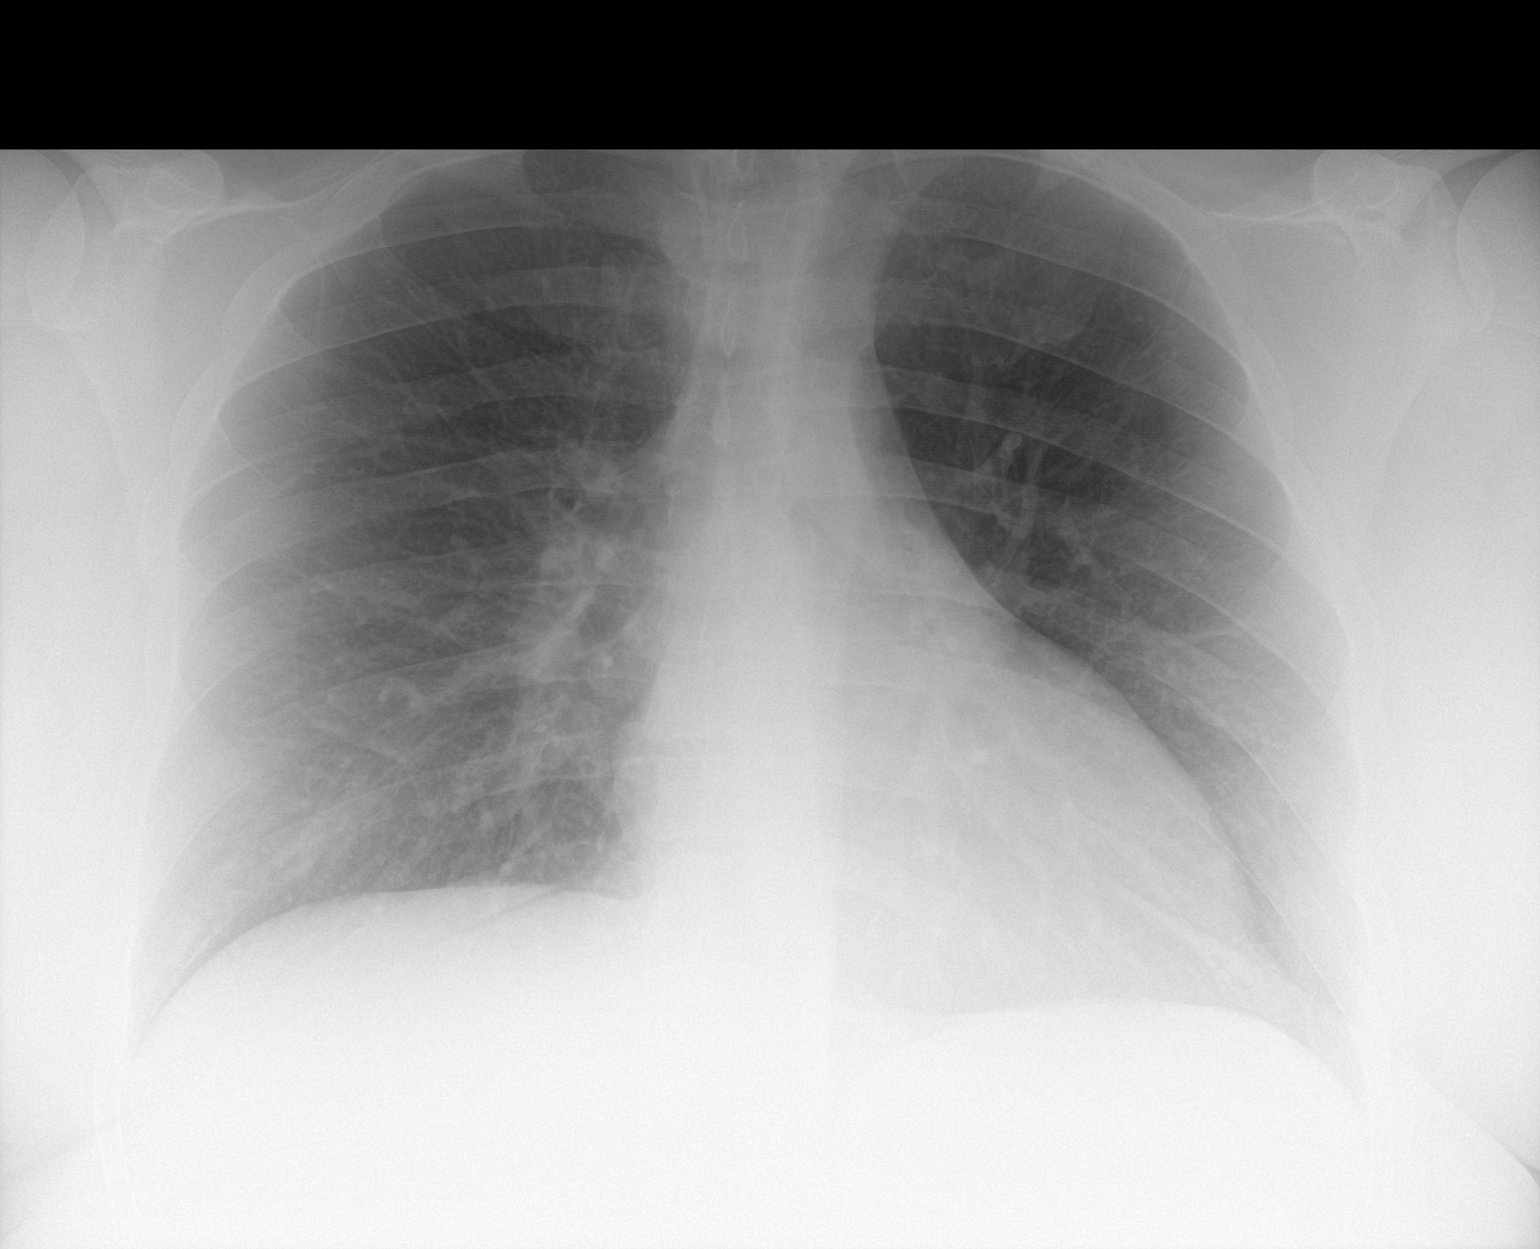

[1 of 1 positions shown; findings below may reference images not displayed]

FINDINGS: Heart and mediastinal contours are within normal limits. No focal
opacities or effusions. No acute bony abnormality.
IMPRESSION: No active disease.

## 2022-04-19 ENCOUNTER — Encounter (HOSPITAL_BASED_OUTPATIENT_CLINIC_OR_DEPARTMENT_OTHER): Payer: Self-pay | Admitting: Pediatrics

## 2022-04-19 ENCOUNTER — Other Ambulatory Visit (HOSPITAL_BASED_OUTPATIENT_CLINIC_OR_DEPARTMENT_OTHER): Payer: Self-pay

## 2022-04-19 ENCOUNTER — Emergency Department (HOSPITAL_BASED_OUTPATIENT_CLINIC_OR_DEPARTMENT_OTHER)
Admission: EM | Admit: 2022-04-19 | Discharge: 2022-04-19 | Disposition: A | Discharge status: Home / Self Care | Payer: Medicaid Other | Attending: Emergency Medicine | Admitting: Emergency Medicine

## 2022-04-19 ENCOUNTER — Emergency Department (HOSPITAL_BASED_OUTPATIENT_CLINIC_OR_DEPARTMENT_OTHER): Payer: Medicaid Other

## 2022-04-19 ENCOUNTER — Other Ambulatory Visit: Payer: Self-pay

## 2022-04-19 DIAGNOSIS — M25562 Pain in left knee: Secondary | ICD-10-CM | POA: Insufficient documentation

## 2022-04-19 MED ORDER — DICLOFENAC SODIUM 1 % EX GEL
4.0000 g | Freq: Four times a day (QID) | CUTANEOUS | 0 refills | Status: DC
  Filled 2022-04-19: qty 100, 7d supply, fill #0

## 2022-04-19 NOTE — ED Provider Notes (Signed)
[facility] EMERGENCY DEPARTMENT Provider Note   CSN: [id number] Arrival date & time: 04/19/22  1526     History  Chief Complaint  Patient presents with   Knee Pain    [name] is a 31 y.o. male.   Knee Pain  Patient is a 31 year old male presented emergency room today with complaint of left knee pain that began 2 weeks ago after he flexed his knee very hard due to falling backwards with his leg trapped behind him.  He states that he was able to stand up and walk although it is very painful.  He states that the pain in his knee has been gradually improving however he states that he has been 2 weeks now as his pain is not completely resolved.  This prompted him to come to the emergency room.  No significant changes in his symptoms.       Home Medications Prior to Admission medications   Medication Sig Start Date End Date Taking? Authorizing Provider  fluticasone (FLONASE) 50 MCG/ACT nasal spray Place 2 sprays into both nostrils daily. 11/10/20   Couture, Cortni S, PA-C  guaiFENesin (MUCINEX) 600 MG 12 hr tablet Take 1 tablet (600 mg total) by mouth 2 (two) times daily. 11/10/20   Couture, Cortni S, PA-C  naproxen (NAPROSYN) 500 MG tablet Take 1 tablet (500 mg total) by mouth 2 (two) times daily with a meal. Patient not taking: Reported on 05/23/2020 01/07/20   Palumbo, April, MD  oseltamivir (TAMIFLU) 75 MG capsule Take 1 capsule (75 mg total) by mouth every 12 (twelve) hours. 06/06/21   Sherrell Puller, PA-C      Allergies    Patient has no known allergies.    Review of Systems   Review of Systems  Physical Exam Updated Vital Signs BP 125/83 (BP Location: Left Arm)   Pulse 79   Temp 98 F (36.7 C) (Oral)   Resp 20   Ht 6\' 5"  (1.956 m)   Wt 113.4 kg   SpO2 97%   BMI 29.65 kg/m  Physical Exam Vitals and nursing note reviewed.  Constitutional:      General: He is not in acute distress.    Appearance: Normal appearance. He is not ill-appearing.   HENT:     Head: Normocephalic and atraumatic.  Eyes:     General: No scleral icterus.       Right eye: No discharge.        Left eye: No discharge.     Conjunctiva/sclera: Conjunctivae normal.  Pulmonary:     Effort: Pulmonary effort is normal.     Breath sounds: No stridor.  Musculoskeletal:     Comments: FROM of L knee. No laxity on ant/post drawer exam.  Sensation intact in BL legs. Slight limp on exam  Neurological:     Mental Status: He is alert and oriented to person, place, and time. Mental status is at baseline.     ED Results / Procedures / Treatments   Labs (all labs ordered are listed, but only abnormal results are displayed) Labs Reviewed - No data to display  EKG None  Radiology DG Knee Complete 4 Views Left  Result Date: 04/19/2022 CLINICAL DATA:  Left knee pain, fall 2 weeks ago. EXAM: LEFT KNEE - COMPLETE 4+ VIEW COMPARISON:  None Available. FINDINGS: No evidence of fracture, dislocation, or joint effusion. No evidence of arthropathy or other focal bone abnormality. Soft tissues are unremarkable. IMPRESSION: Negative. Electronically Signed   By: Wyatt Mage  Ahmed D.O.   On: 04/19/2022 16:06    Procedures Procedures    Medications Ordered in ED Medications - No data to display  ED Course/ Medical Decision Making/ A&P                           Medical Decision Making Amount and/or Complexity of Data Reviewed Radiology: ordered.   Patient is a 31 year old male presented emergency room today with complaint of left knee pain that began 2 weeks ago after he flexed his knee very hard due to falling backwards with his leg trapped behind him.  He states that he was able to stand up and walk although it is very painful.  He states that the pain in his knee has been gradually improving however he states that he has been 2 weeks now as his pain is not completely resolved.  This prompted him to come to the emergency room.  No significant changes in his symptoms.\  I  personally viewed x-ray of left knee.  I agree with radiology read no acute fractures  Patient's exam is overall relatively reassuring.  Potentially knee sprain.  Would probably benefit from physical therapy.  Recommend orthopedic follow-up.  Return precautions discussed. Patient declined crutches states that he feels that he is walking well.  We will discharge him at this time  Final Clinical Impression(s) / ED Diagnoses Final diagnoses:  Acute pain of left knee    Rx / DC Orders ED Discharge Orders     None         Gailen Shelter, [place] 04/19/22 1925    Glyn Ade, MD 04/19/22 2109

## 2022-04-19 NOTE — Discharge Instructions (Addendum)
The x-ray of your knee is without any bony abnormalities.  This certainly can be the case with ligamentous injuries.  Please follow-up with an orthopedist.  I recommend taking Tylenol, ibuprofen and applying Voltaren gel as prescribed.  Please use Tylenol or ibuprofen for pain.  You may use 600 mg ibuprofen every 6 hours or 1000 mg of Tylenol every 6 hours.  You may choose to alternate between the 2.  This would be most effective.  Not to exceed 4 g of Tylenol within 24 hours.  Not to exceed 3200 mg ibuprofen 24 hours.

## 2022-04-19 NOTE — ED Triage Notes (Signed)
C/O left knee pain started two weeks ago; reported has some difficulty walking and activity intolerance

## 2022-04-28 ENCOUNTER — Other Ambulatory Visit (HOSPITAL_BASED_OUTPATIENT_CLINIC_OR_DEPARTMENT_OTHER): Payer: Self-pay

## 2023-01-06 ENCOUNTER — Emergency Department (HOSPITAL_BASED_OUTPATIENT_CLINIC_OR_DEPARTMENT_OTHER)
Admission: EM | Admit: 2023-01-06 | Discharge: 2023-01-06 | Disposition: A | Discharge status: Home / Self Care | Payer: Self-pay | Attending: Emergency Medicine | Admitting: Emergency Medicine

## 2023-01-06 ENCOUNTER — Emergency Department (HOSPITAL_BASED_OUTPATIENT_CLINIC_OR_DEPARTMENT_OTHER): Payer: Self-pay

## 2023-01-06 ENCOUNTER — Encounter (HOSPITAL_BASED_OUTPATIENT_CLINIC_OR_DEPARTMENT_OTHER): Payer: Self-pay

## 2023-01-06 ENCOUNTER — Other Ambulatory Visit: Payer: Self-pay

## 2023-01-06 DIAGNOSIS — R519 Headache, unspecified: Secondary | ICD-10-CM

## 2023-01-06 DIAGNOSIS — M79602 Pain in left arm: Secondary | ICD-10-CM

## 2023-01-06 DIAGNOSIS — R000 Tachycardia, unspecified: Secondary | ICD-10-CM | POA: Insufficient documentation

## 2023-01-06 LAB — CBC WITH DIFFERENTIAL/PLATELET
Abs Immature Granulocytes: 0.02 10*3/uL (ref 0.00–0.07)
Basophils Absolute: 0 10*3/uL (ref 0.0–0.1)
Basophils Relative: 0 %
Eosinophils Absolute: 0.1 10*3/uL (ref 0.0–0.5)
Eosinophils Relative: 1 %
HCT: 45 % (ref 39.0–52.0)
Hemoglobin: 14.4 g/dL (ref 13.0–17.0)
Immature Granulocytes: 0 %
Lymphocytes Relative: 25 %
Lymphs Abs: 1.8 10*3/uL (ref 0.7–4.0)
MCH: 25.6 pg — ABNORMAL LOW (ref 26.0–34.0)
MCHC: 32 g/dL (ref 30.0–36.0)
MCV: 79.9 fL — ABNORMAL LOW (ref 80.0–100.0)
Monocytes Absolute: 0.5 10*3/uL (ref 0.1–1.0)
Monocytes Relative: 7 %
Neutro Abs: 4.6 10*3/uL (ref 1.7–7.7)
Neutrophils Relative %: 67 %
Platelets: 371 10*3/uL (ref 150–400)
RBC: 5.63 MIL/uL (ref 4.22–5.81)
RDW: 15.7 % — ABNORMAL HIGH (ref 11.5–15.5)
WBC: 7.1 10*3/uL (ref 4.0–10.5)
nRBC: 0 % (ref 0.0–0.2)

## 2023-01-06 LAB — BASIC METABOLIC PANEL
Anion gap: 11 (ref 5–15)
BUN: <5 mg/dL — ABNORMAL LOW (ref 6–20)
CO2: 22 mmol/L (ref 22–32)
Calcium: 8.6 mg/dL — ABNORMAL LOW (ref 8.9–10.3)
Chloride: 105 mmol/L (ref 98–111)
Creatinine, Ser: 0.68 mg/dL (ref 0.61–1.24)
GFR, Estimated: >60 mL/min (ref >60)
Glucose, Bld: 143 mg/dL — ABNORMAL HIGH (ref 70–99)
Potassium: 3.2 mmol/L — ABNORMAL LOW (ref 3.5–5.1)
Sodium: 138 mmol/L (ref 135–145)

## 2023-01-06 LAB — TROPONIN I (HIGH SENSITIVITY)
Troponin I (High Sensitivity): 2 ng/L (ref <18)
Troponin I (High Sensitivity): 2 ng/L (ref <18)

## 2023-01-06 MED ORDER — ASPIRIN 81 MG PO CHEW
324.0000 mg | CHEWABLE_TABLET | Freq: Once | ORAL | Status: AC
  Administered 2023-01-06: 324 mg via ORAL
  Filled 2023-01-06: qty 4

## 2023-01-06 NOTE — ED Triage Notes (Signed)
Patient has a headache and stated his left arm locked up and hurts. He denied numbness at this time. Patient able to move hand and arm with no issues.

## 2023-01-06 NOTE — Discharge Instructions (Signed)
You were seen emergency department for headache and a sharp pain in your left arm associated with some spasm and tightness.  You had blood work EKG and a CAT scan of your head.  There is no definite sign of stroke and no signs of heart attack.  Please drink plenty of fluids and can use Tylenol and ibuprofen for pain.  Return to the emergency department if any worsening or concerning symptoms.

## 2023-01-06 NOTE — ED Provider Notes (Signed)
[place] [facility] AT [facility] Provider Note   CSN: [id number] Arrival date & time: 01/06/23  6213     History  Chief Complaint  Patient presents with   Headache   Arm Pain    [name] is a 32 y.o. male.  He is here for 2 episodes of left arm pain, tightness and spasm that happened this morning starting at 6 AM.  He said it was preceded by a headache.  It resolved but then the arm pain happened again.  Still feels a little tight.  He said the sensation is similar to when he had his heart attack in the past although he did not have any chest pain this time.  He denies any chest pain or shortness of breath.  He did endorse some blurry vision at the time.  He was nauseous.  He does endorse vaping, cocaine, alcohol use.  He said he had a heart attack and was seen at [facility].   Arm Pain This is a new problem. The current episode started 3 to 5 hours ago. The problem has been gradually improving. Associated symptoms include headaches. Pertinent negatives include no chest pain, no abdominal pain and no shortness of breath. Nothing aggravates the symptoms. Nothing relieves the symptoms. He has tried rest for the symptoms. The treatment provided mild relief.       Home Medications Prior to Admission medications   Medication Sig Start Date End Date Taking? Authorizing Provider  diclofenac Sodium (VOLTAREN) 1 % GEL Apply 4 g topically 4 (four) times daily. 04/19/22   Fondaw, Rodrigo Ran, PA  fluticasone (FLONASE) 50 MCG/ACT nasal spray Place 2 sprays into both nostrils daily. 11/10/20   Couture, Cortni S, PA-C  guaiFENesin (MUCINEX) 600 MG 12 hr tablet Take 1 tablet (600 mg total) by mouth 2 (two) times daily. 11/10/20   Couture, Cortni S, PA-C  naproxen (NAPROSYN) 500 MG tablet Take 1 tablet (500 mg total) by mouth 2 (two) times daily with a meal. Patient not taking: Reported on 05/23/2020 01/07/20   Palumbo, April, MD  oseltamivir (TAMIFLU) 75 MG  capsule Take 1 capsule (75 mg total) by mouth every 12 (twelve) hours. 06/06/21   Achille Rich, PA-C      Allergies    Patient has no known allergies.    Review of Systems   Review of Systems  Constitutional:  Negative for fever.  Eyes:  Positive for visual disturbance.  Respiratory:  Negative for shortness of breath.   Cardiovascular:  Negative for chest pain.  Gastrointestinal:  Negative for abdominal pain.  Neurological:  Positive for tremors and headaches.    Physical Exam Updated Vital Signs BP (!) 159/105   Pulse (!) 106   Temp 98.4 F (36.9 C) (Oral)   Resp 18   Ht 6\' 4"  (1.93 m)   Wt (!) 163.3 kg   SpO2 97%   BMI 43.82 kg/m  Physical Exam Vitals and nursing note reviewed.  Constitutional:      General: He is not in acute distress.    Appearance: He is well-developed.  HENT:     Head: Normocephalic and atraumatic.  Eyes:     Conjunctiva/sclera: Conjunctivae normal.  Cardiovascular:     Rate and Rhythm: Regular rhythm. Tachycardia present.     Heart sounds: Normal heart sounds. No murmur heard. Pulmonary:     Effort: Pulmonary effort is normal. No respiratory distress.     Breath sounds: Normal breath sounds.  Abdominal:     Palpations: Abdomen is soft.     Tenderness: There is no abdominal tenderness.  Musculoskeletal:        General: No swelling.     Cervical back: Neck supple.  Skin:    General: Skin is warm and dry.     Capillary Refill: Capillary refill takes less than 2 seconds.  Neurological:     Mental Status: He is alert and oriented to person, place, and time.     GCS: GCS eye subscore is 4. GCS verbal subscore is 5. GCS motor subscore is 6.     Cranial Nerves: No cranial nerve deficit, dysarthria or facial asymmetry.     Sensory: No sensory deficit.     Motor: No weakness.     ED Results / Procedures / Treatments   Labs (all labs ordered are listed, but only abnormal results are displayed) Labs Reviewed  BASIC METABOLIC PANEL -  Abnormal; Notable for the following components:      Result Value   Potassium 3.2 (*)    Glucose, Bld 143 (*)    BUN <5 (*)    Calcium 8.6 (*)    All other components within normal limits  CBC WITH DIFFERENTIAL/PLATELET - Abnormal; Notable for the following components:   MCV 79.9 (*)    MCH 25.6 (*)    RDW 15.7 (*)    All other components within normal limits  TROPONIN I (HIGH SENSITIVITY)  TROPONIN I (HIGH SENSITIVITY)    EKG EKG Interpretation  Date/Time:  Saturday January 06 2023 09:04:54 EDT Ventricular Rate:  87 PR Interval:  185 QRS Duration: 88 QT Interval:  363 QTC Calculation: 437 R Axis:   34 Text Interpretation: Sinus rhythm No significant change since prior 10/21 Confirmed by Meridee Score [phone]) on 01/06/2023 9:08:28 AM  Radiology CT Head Wo Contrast  Result Date: 01/06/2023 CLINICAL DATA:  Headache with left arm dysfunction. EXAM: CT HEAD WITHOUT CONTRAST TECHNIQUE: Contiguous axial images were obtained from the base of the skull through the vertex without intravenous contrast. RADIATION DOSE REDUCTION: This exam was performed according to the departmental dose-optimization program which includes automated exposure control, adjustment of the mA and/or kV according to patient size and/or use of iterative reconstruction technique. COMPARISON:  None Available. FINDINGS: Brain: No evidence of acute infarction, hemorrhage, hydrocephalus, extra-axial collection or mass lesion/mass effect. Vascular: No hyperdense vessel or unexpected calcification. Skull: Normal. Negative for fracture or focal lesion. Sinuses/Orbits: No acute finding. IMPRESSION: Negative head CT. Electronically Signed   By: Tiburcio Pea M.D.   On: 01/06/2023 11:06   DG Chest Port 1 View  Result Date: 01/06/2023 CLINICAL DATA:  Chest pain EXAM: PORTABLE CHEST 1 VIEW COMPARISON:  11/10/2020 FINDINGS: Low volume film. Cardiopericardial silhouette is at upper limits of normal for size. The lungs are clear  without focal pneumonia, edema, pneumothorax or pleural effusion. No acute bony abnormality. IMPRESSION: Low volume film without acute cardiopulmonary findings. Electronically Signed   By: Kennith Center M.D.   On: 01/06/2023 10:20    Procedures Procedures    Medications Ordered in ED Medications  aspirin chewable tablet 324 mg (324 mg Oral Given 01/06/23 6045)    ED Course/ Medical Decision Making/ A&P Clinical Course as of 01/06/23 1645  Sat Jan 06, 2023  4098 Chest x-ray interpreted by me as no acute infiltrate.  Awaiting radiology reading. [MB]    Clinical Course User Index [MB] Terrilee Files, MD  Medical Decision Making Amount and/or Complexity of Data Reviewed Labs: ordered. Radiology: ordered.  Risk OTC drugs.   This patient complains of left arm pain and spasm, headache; this involves an extensive number of treatment Options and is a complaint that carries with it a high risk of complications and morbidity. The differential includes musculoskeletal pain, radiculopathy, stroke, bleed, ACS  I ordered, reviewed and interpreted labs, which included CBC with normal white count normal hemoglobin, chemistries with mildly low potassium normal renal function, troponins flat I ordered medication oral aspirin and reviewed PMP when indicated. I ordered imaging studies which included head CT, chest x-ray and I independently    visualized and interpreted imaging which showed no acute findings Additional history obtained from patient's family member Previous records obtained and reviewed in epic, do not see prior evidence of MI Cardiac monitoring reviewed, normal sinus rhythm Social determinants considered, ongoing tobacco use Critical Interventions: None  After the interventions stated above, I reevaluated the patient and found patient to be neuro intact in no distress Admission and further testing considered, no indications for admission or further  workup at this time.  Patient's blood pressure is improved still satting 100% on room air in no distress.  Recommended patient stop cocaine and limit alcohol use.  He will need a primary care doctor for close follow-up.  Return instructions discussed.         Final Clinical Impression(s) / ED Diagnoses Final diagnoses:  Left arm pain  Generalized headache    Rx / DC Orders ED Discharge Orders     None         Terrilee Files, MD 01/06/23 [phone]

## 2023-01-24 ENCOUNTER — Emergency Department (HOSPITAL_COMMUNITY): Payer: Self-pay

## 2023-01-24 ENCOUNTER — Emergency Department (HOSPITAL_COMMUNITY)
Admission: EM | Admit: 2023-01-24 | Discharge: 2023-01-24 | Disposition: A | Discharge status: Home / Self Care | Payer: Self-pay | Attending: Emergency Medicine | Admitting: Emergency Medicine

## 2023-01-24 ENCOUNTER — Other Ambulatory Visit: Payer: Self-pay

## 2023-01-24 ENCOUNTER — Encounter (HOSPITAL_COMMUNITY): Payer: Self-pay

## 2023-01-24 DIAGNOSIS — R748 Abnormal levels of other serum enzymes: Secondary | ICD-10-CM | POA: Insufficient documentation

## 2023-01-24 DIAGNOSIS — R61 Generalized hyperhidrosis: Secondary | ICD-10-CM | POA: Insufficient documentation

## 2023-01-24 DIAGNOSIS — M79602 Pain in left arm: Secondary | ICD-10-CM

## 2023-01-24 DIAGNOSIS — M62838 Other muscle spasm: Secondary | ICD-10-CM | POA: Insufficient documentation

## 2023-01-24 LAB — CBC WITH DIFFERENTIAL/PLATELET
Abs Immature Granulocytes: 0.02 10*3/uL (ref 0.00–0.07)
Basophils Absolute: 0 10*3/uL (ref 0.0–0.1)
Basophils Relative: 1 %
Eosinophils Absolute: 0.4 10*3/uL (ref 0.0–0.5)
Eosinophils Relative: 6 %
HCT: 47.8 % (ref 39.0–52.0)
Hemoglobin: 15 g/dL (ref 13.0–17.0)
Immature Granulocytes: 0 %
Lymphocytes Relative: 35 %
Lymphs Abs: 2.3 10*3/uL (ref 0.7–4.0)
MCH: 25.1 pg — ABNORMAL LOW (ref 26.0–34.0)
MCHC: 31.4 g/dL (ref 30.0–36.0)
MCV: 80.1 fL (ref 80.0–100.0)
Monocytes Absolute: 0.7 10*3/uL (ref 0.1–1.0)
Monocytes Relative: 10 %
Neutro Abs: 3.3 10*3/uL (ref 1.7–7.7)
Neutrophils Relative %: 48 %
Platelets: 331 10*3/uL (ref 150–400)
RBC: 5.97 MIL/uL — ABNORMAL HIGH (ref 4.22–5.81)
RDW: 15.4 % (ref 11.5–15.5)
WBC: 6.8 10*3/uL (ref 4.0–10.5)
nRBC: 0 % (ref 0.0–0.2)

## 2023-01-24 LAB — COMPREHENSIVE METABOLIC PANEL
ALT: 57 U/L — ABNORMAL HIGH (ref 0–44)
AST: 47 U/L — ABNORMAL HIGH (ref 15–41)
Albumin: 4 g/dL (ref 3.5–5.0)
Alkaline Phosphatase: 52 U/L (ref 38–126)
Anion gap: 11 (ref 5–15)
BUN: <5 mg/dL — ABNORMAL LOW (ref 6–20)
CO2: 20 mmol/L — ABNORMAL LOW (ref 22–32)
Calcium: 8.9 mg/dL (ref 8.9–10.3)
Chloride: 104 mmol/L (ref 98–111)
Creatinine, Ser: 0.73 mg/dL (ref 0.61–1.24)
GFR, Estimated: >60 mL/min (ref >60)
Glucose, Bld: 134 mg/dL — ABNORMAL HIGH (ref 70–99)
Potassium: 3.8 mmol/L (ref 3.5–5.1)
Sodium: 135 mmol/L (ref 135–145)
Total Bilirubin: 0.8 mg/dL (ref 0.3–1.2)
Total Protein: 7.5 g/dL (ref 6.5–8.1)

## 2023-01-24 LAB — TROPONIN I (HIGH SENSITIVITY)
Troponin I (High Sensitivity): 4 ng/L (ref <18)
Troponin I (High Sensitivity): 4 ng/L (ref <18)

## 2023-01-24 LAB — MAGNESIUM: Magnesium: 2 mg/dL (ref 1.7–2.4)

## 2023-01-24 LAB — CK: Total CK: 610 U/L — ABNORMAL HIGH (ref 49–397)

## 2023-01-24 MED ORDER — LACTATED RINGERS IV BOLUS
1000.0000 mL | Freq: Once | INTRAVENOUS | Status: AC
  Administered 2023-01-24: 1000 mL via INTRAVENOUS

## 2023-01-24 MED ORDER — DIAZEPAM 5 MG/ML IJ SOLN
5.0000 mg | Freq: Once | INTRAMUSCULAR | Status: AC
  Administered 2023-01-24: 5 mg via INTRAVENOUS
  Filled 2023-01-24: qty 2

## 2023-01-24 NOTE — ED Provider Notes (Signed)
[place] EMERGENCY DEPARTMENT AT [facility] Provider Note   CSN: [id number] Arrival date & time: 01/24/23  1002     History  Chief Complaint  Patient presents with   Tremors    [name] is a 32 y.o. male.  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  The patient states about 30 minutes prior to arrival he started to feel a sharp pain radiating from his left forearm up to his left shoulder.  He states that his muscles feel tense and the pain is worse when he tries to bend his arm.  He denies any numbness or weakness.  He states that he feels the pain in his left upper chest near his shoulder.  He denies any shortness of breath.  He states that he had nausea and vomiting earlier in the morning before the pain started.    The history is provided by the patient and a relative.       Home Medications Prior to Admission medications   Medication Sig Start Date End Date Taking? Authorizing Provider  diclofenac Sodium (VOLTAREN) 1 % GEL Apply 4 g topically 4 (four) times daily. 04/19/22   Fondaw, Rodrigo Ran, PA  fluticasone (FLONASE) 50 MCG/ACT nasal spray Place 2 sprays into both nostrils daily. 11/10/20   Couture, Cortni S, PA-C  guaiFENesin (MUCINEX) 600 MG 12 hr tablet Take 1 tablet (600 mg total) by mouth 2 (two) times daily. 11/10/20   Couture, Cortni S, PA-C  naproxen (NAPROSYN) 500 MG tablet Take 1 tablet (500 mg total) by mouth 2 (two) times daily with a meal. Patient not taking: Reported on 05/23/2020 01/07/20   Palumbo, April, MD  oseltamivir (TAMIFLU) 75 MG capsule Take 1 capsule (75 mg total) by mouth every 12 (twelve) hours. 06/06/21   Achille Rich, PA-C      Allergies    Patient has no known allergies.    Review of Systems   Review of Systems  Physical Exam Updated Vital Signs BP (!) 150/64 (BP Location:  Right Arm)   Pulse 77   Temp 98.2 F (36.8 C) (Oral)   Resp 16   Ht 6\' 4"  (1.93 m)   Wt (!) 163 kg   SpO2 100%   BMI 43.74 kg/m  Physical Exam Vitals and nursing note reviewed.  Constitutional:      General: He is not in acute distress.    Appearance: He is ill-appearing and diaphoretic.  HENT:     Head: Normocephalic and atraumatic.     Nose: Nose normal.     Mouth/Throat:     Mouth: Mucous membranes are moist.     Pharynx: Oropharynx is clear.  Eyes:     Extraocular Movements: Extraocular movements intact.     Conjunctiva/sclera: Conjunctivae normal.  Cardiovascular:     Rate and Rhythm: Normal rate and regular rhythm.     Pulses: Normal pulses.     Heart sounds: Normal heart sounds.  Pulmonary:     Effort: Pulmonary effort is normal.     Breath sounds: Normal breath sounds.  Abdominal:     General: Abdomen is flat.     Palpations: Abdomen is soft.     Tenderness: There is no abdominal tenderness.  Musculoskeletal:     Cervical back: Normal range of motion and neck supple.     Comments:  LUE tremor at rest with tight forearm muscles, increased pain with attempted elbow flexion, grip strength intact  Skin:    General: Skin is warm.  Neurological:     General: No focal deficit present.     Mental Status: He is alert and oriented to person, place, and time.     Sensory: No sensory deficit.     Motor: No weakness.  Psychiatric:        Mood and Affect: Mood normal.        Behavior: Behavior normal.     ED Results / Procedures / Treatments   Labs (all labs ordered are listed, but only abnormal results are displayed) Labs Reviewed  COMPREHENSIVE METABOLIC PANEL - Abnormal; Notable for the following components:      Result Value   CO2 20 (*)    Glucose, Bld 134 (*)    BUN <5 (*)    AST 47 (*)    ALT 57 (*)    All other components within normal limits  CBC WITH DIFFERENTIAL/PLATELET - Abnormal; Notable for the following components:   RBC 5.97 (*)    MCH 25.1  (*)    All other components within normal limits  CK - Abnormal; Notable for the following components:   Total CK 610 (*)    All other components within normal limits  MAGNESIUM  TROPONIN I (HIGH SENSITIVITY)  TROPONIN I (HIGH SENSITIVITY)    EKG EKG Interpretation  Date/Time:  Wednesday January 24 2023 10:10:04 EDT Ventricular Rate:  93 PR Interval:    QRS Duration: 86 QT Interval:  356 QTC Calculation: 438 R Axis:   67 Text Interpretation: Atrial flutter vs artifact Confirmed by Elayne Snare ([id number]) on 01/24/2023 10:11:25 AM  Radiology DG Chest 2 View  Result Date: 01/24/2023 CLINICAL DATA:  Chest pain, left arm pain. EXAM: CHEST - 2 VIEW COMPARISON:  None Available. FINDINGS: The heart size and mediastinal contours are within normal limits. Low lung volumes without evidence of focal consolidation or pleural effusion. The visualized skeletal structures are unremarkable. IMPRESSION: Low lung volumes without evidence of acute cardiopulmonary process. Electronically Signed   By: Larose Hires D.O.   On: 01/24/2023 10:52    Procedures Procedures    Medications Ordered in ED Medications  diazepam (VALIUM) injection 5 mg (5 mg Intravenous Given 01/24/23 1033)  lactated ringers bolus 1,000 mL (1,000 mLs Intravenous New Bag/Given 01/24/23 1130)    ED Course/ Medical Decision Making/ A&P Clinical Course as of 01/24/23 1356  Wed Jan 24, 2023  1123 Mildly elevated CK though not consistent with rhabdo. Initial troponin negative, will need 2 hr repeat with symptoms starting just prior to arrival. Mild transaminitis. [VK]  1215 Upon reassessment, tremors has resolved, he's able to fully range his LUE without pain. Repeat troponin is pending. [VK]  1346 Repeat troponin negative. Patient is stable for discharge home. Symptoms may be related to substance use and patient educated on stopping ETOH/cocaine. He was recommended close PCP follow up. [VK]    Clinical Course User Index [VK]  Rexford Maus, DO                             Medical Decision Making This patient presents to the ED with chief complaint(s) of LUE pain/tremors with pertinent past medical history of ETOH/cocaine use, MI which further complicates the presenting complaint. The complaint involves an extensive differential diagnosis and also carries with it a high  risk of complications and morbidity.    The differential diagnosis includes ACS, arrhythmia, electrolyte abnormality, rhabdo, alcohol withdrawal, cocaine induced vasospasm  Additional history obtained: Additional history obtained from family Records reviewed prior ED records  ED Course and Reassessment: On patient's arrival to the emergency department he is diaphoretic and mildly anxious appearing with tremors to the left arm with tight muscles and increased pain with attempting elbow flexion.  He is neurovascularly intact.  Patient will have workup including EKG, troponin, electrolytes and CK.  He will be treated with Valium for muscle spasm or possible withdrawal and will be closely reassessed.  Independent labs interpretation:  The following labs were independently interpreted: Mildly elevated CK otherwise within normal range  Independent visualization of imaging: - I independently visualized the following imaging with scope of interpretation limited to determining acute life threatening conditions related to emergency care: Chest x-ray, which revealed no acute disease  Consultation: - Consulted or discussed management/test interpretation w/ external professional: N/A  Consideration for admission or further workup: Patient has no emergent conditions requiring admission or further work-up at this time and is stable for discharge home with primary care follow-up  Social Determinants of health: N/A    Amount and/or Complexity of Data Reviewed Labs: ordered. Radiology: ordered.  Risk Prescription drug  management.          Final Clinical Impression(s) / ED Diagnoses Final diagnoses:  Left arm pain  Muscle spasm    Rx / DC Orders ED Discharge Orders     None         Rexford Maus, DO 01/24/23 1356

## 2023-01-24 NOTE — Discharge Instructions (Signed)
You were seen in the emergency department for your left arm pain and spasms.  Your workup showed no signs of heart attack or stress on your heart and no signs of abnormal electrolytes.  Your spasms may have been induced from your cocaine use or your alcohol use and you should try to cut back on substance use to help prevent recurrent symptoms.  You should make sure that you slowly decrease your alcohol use over time to prevent any alcohol withdrawal symptoms or withdrawal seizures.  You can follow-up with your primary doctor to have your symptoms rechecked and you can follow-up with the behavioral health urgent care as needed for any assistance with substance use rehab.  You should return to the emergency department if you have significantly worsening pain or associated chest pain, severe shortness of breath, numbness or weakness in your arm or any other new or concerning symptoms.

## 2023-01-24 NOTE — ED Triage Notes (Signed)
Pt to the ed from home with a CC of tremor with left arm pain x 20 mins. Pt relays drinking a lot of alcohol and cocaine last night that he stopped this morning at 4 am. Pt denies current CP, SOB, dizziness, LOC. Pt relays similar episode last month after the cocaine that came with a headache.

## 2023-01-31 ENCOUNTER — Encounter (HOSPITAL_COMMUNITY): Payer: Self-pay | Admitting: Nurse Practitioner

## 2023-01-31 ENCOUNTER — Other Ambulatory Visit (HOSPITAL_COMMUNITY)
Admission: EM | Admit: 2023-01-31 | Discharge: 2023-02-02 | Disposition: A | Discharge status: Home / Self Care | Payer: No Payment, Other | Source: Home / Self Care | Admitting: Nurse Practitioner

## 2023-01-31 ENCOUNTER — Encounter (HOSPITAL_COMMUNITY): Payer: Self-pay

## 2023-01-31 ENCOUNTER — Ambulatory Visit (HOSPITAL_COMMUNITY)
Admission: EM | Admit: 2023-01-31 | Discharge: 2023-01-31 | Disposition: A | Discharge status: Other Acute Inpatient Hospital | Payer: No Payment, Other | Attending: Psychiatry | Admitting: Psychiatry

## 2023-01-31 DIAGNOSIS — F1410 Cocaine abuse, uncomplicated: Secondary | ICD-10-CM

## 2023-01-31 DIAGNOSIS — F1910 Other psychoactive substance abuse, uncomplicated: Secondary | ICD-10-CM

## 2023-01-31 DIAGNOSIS — F1490 Cocaine use, unspecified, uncomplicated: Secondary | ICD-10-CM | POA: Insufficient documentation

## 2023-01-31 DIAGNOSIS — F1020 Alcohol dependence, uncomplicated: Secondary | ICD-10-CM | POA: Insufficient documentation

## 2023-01-31 DIAGNOSIS — F1990 Other psychoactive substance use, unspecified, uncomplicated: Secondary | ICD-10-CM

## 2023-01-31 DIAGNOSIS — F1010 Alcohol abuse, uncomplicated: Secondary | ICD-10-CM | POA: Insufficient documentation

## 2023-01-31 DIAGNOSIS — F1090 Alcohol use, unspecified, uncomplicated: Secondary | ICD-10-CM | POA: Diagnosis present

## 2023-01-31 LAB — CBC WITH DIFFERENTIAL/PLATELET
Abs Immature Granulocytes: 0.02 10*3/uL (ref 0.00–0.07)
Basophils Absolute: 0.1 10*3/uL (ref 0.0–0.1)
Basophils Relative: 1 %
Eosinophils Absolute: 0.2 10*3/uL (ref 0.0–0.5)
Eosinophils Relative: 3 %
HCT: 49.7 % (ref 39.0–52.0)
Hemoglobin: 15.7 g/dL (ref 13.0–17.0)
Immature Granulocytes: 0 %
Lymphocytes Relative: 37 %
Lymphs Abs: 2.8 10*3/uL (ref 0.7–4.0)
MCH: 25.7 pg — ABNORMAL LOW (ref 26.0–34.0)
MCHC: 31.6 g/dL (ref 30.0–36.0)
MCV: 81.2 fL (ref 80.0–100.0)
Monocytes Absolute: 0.5 10*3/uL (ref 0.1–1.0)
Monocytes Relative: 7 %
Neutro Abs: 3.8 10*3/uL (ref 1.7–7.7)
Neutrophils Relative %: 52 %
Platelets: 343 10*3/uL (ref 150–400)
RBC: 6.12 MIL/uL — ABNORMAL HIGH (ref 4.22–5.81)
RDW: 14.8 % (ref 11.5–15.5)
WBC: 7.4 10*3/uL (ref 4.0–10.5)
nRBC: 0 % (ref 0.0–0.2)

## 2023-01-31 LAB — COMPREHENSIVE METABOLIC PANEL
ALT: 48 U/L — ABNORMAL HIGH (ref 0–44)
AST: 38 U/L (ref 15–41)
Albumin: 3.7 g/dL (ref 3.5–5.0)
Alkaline Phosphatase: 47 U/L (ref 38–126)
Anion gap: 14 (ref 5–15)
BUN: <5 mg/dL — ABNORMAL LOW (ref 6–20)
CO2: 20 mmol/L — ABNORMAL LOW (ref 22–32)
Calcium: 9.5 mg/dL (ref 8.9–10.3)
Chloride: 102 mmol/L (ref 98–111)
Creatinine, Ser: 0.76 mg/dL (ref 0.61–1.24)
GFR, Estimated: >60 mL/min (ref >60)
Glucose, Bld: 149 mg/dL — ABNORMAL HIGH (ref 70–99)
Potassium: 3.7 mmol/L (ref 3.5–5.1)
Sodium: 136 mmol/L (ref 135–145)
Total Bilirubin: 0.5 mg/dL (ref 0.3–1.2)
Total Protein: 7.2 g/dL (ref 6.5–8.1)

## 2023-01-31 LAB — POCT URINE DRUG SCREEN - MANUAL ENTRY (I-SCREEN)
POC Amphetamine UR: NOT DETECTED
POC Buprenorphine (BUP): NOT DETECTED
POC Cocaine UR: NOT DETECTED
POC Marijuana UR: NOT DETECTED
POC Methadone UR: NOT DETECTED
POC Methamphetamine UR: NOT DETECTED
POC Morphine: NOT DETECTED
POC Oxazepam (BZO): NOT DETECTED
POC Oxycodone UR: NOT DETECTED
POC Secobarbital (BAR): NOT DETECTED

## 2023-01-31 LAB — LIPID PANEL
Cholesterol: 218 mg/dL — ABNORMAL HIGH (ref 0–200)
HDL: 33 mg/dL — ABNORMAL LOW (ref >40)
LDL Cholesterol: 147 mg/dL — ABNORMAL HIGH (ref 0–99)
Total CHOL/HDL Ratio: 6.6 RATIO
Triglycerides: 192 mg/dL — ABNORMAL HIGH (ref <150)
VLDL: 38 mg/dL (ref 0–40)

## 2023-01-31 LAB — ETHANOL: Alcohol, Ethyl (B): 188 mg/dL — ABNORMAL HIGH (ref <10)

## 2023-01-31 LAB — TSH: TSH: 0.993 u[IU]/mL (ref 0.350–4.500)

## 2023-01-31 MED ORDER — LORAZEPAM 1 MG PO TABS: 1.0000 mg | ORAL_TABLET | Freq: Four times a day (QID) | ORAL | Status: DC | PRN

## 2023-01-31 MED ORDER — CLONIDINE HCL 0.1 MG PO TABS: 0.1000 mg | ORAL_TABLET | Freq: Four times a day (QID) | ORAL | Status: DC

## 2023-01-31 MED ORDER — TRAZODONE HCL 50 MG PO TABS: 50.0000 mg | ORAL_TABLET | Freq: Every evening | ORAL | Status: DC | PRN

## 2023-01-31 MED ORDER — METHOCARBAMOL 500 MG PO TABS: 500.0000 mg | ORAL_TABLET | Freq: Three times a day (TID) | ORAL | Status: DC | PRN

## 2023-01-31 MED ORDER — CLONIDINE HCL 0.1 MG PO TABS: 0.1000 mg | ORAL_TABLET | ORAL | Status: DC

## 2023-01-31 MED ORDER — NICOTINE 21 MG/24HR TD PT24
21.0000 mg | MEDICATED_PATCH | Freq: Every day | TRANSDERMAL | Status: DC
  Administered 2023-02-01: 21 mg via TRANSDERMAL
  Filled 2023-01-31: qty 1

## 2023-01-31 MED ORDER — LORAZEPAM 1 MG PO TABS: 1.0000 mg | ORAL_TABLET | Freq: Every day | ORAL | Status: DC

## 2023-01-31 MED ORDER — ONDANSETRON 4 MG PO TBDP: 4.0000 mg | ORAL_TABLET | Freq: Four times a day (QID) | ORAL | Status: DC | PRN

## 2023-01-31 MED ORDER — CLONIDINE HCL 0.1 MG PO TABS
0.1000 mg | ORAL_TABLET | Freq: Four times a day (QID) | ORAL | Status: DC
  Filled 2023-01-31: qty 1

## 2023-01-31 MED ORDER — NAPROXEN 500 MG PO TABS: 500.0000 mg | ORAL_TABLET | Freq: Two times a day (BID) | ORAL | Status: DC | PRN

## 2023-01-31 MED ORDER — LORAZEPAM 1 MG PO TABS: 1.0000 mg | ORAL_TABLET | Freq: Four times a day (QID) | ORAL | Status: DC

## 2023-01-31 MED ORDER — LOPERAMIDE HCL 2 MG PO CAPS: 2.0000 mg | ORAL_CAPSULE | ORAL | Status: DC | PRN

## 2023-01-31 MED ORDER — THIAMINE MONONITRATE 100 MG PO TABS: 100.0000 mg | ORAL_TABLET | Freq: Every day | ORAL | Status: DC

## 2023-01-31 MED ORDER — LORAZEPAM 1 MG PO TABS: 1.0000 mg | ORAL_TABLET | Freq: Two times a day (BID) | ORAL | Status: DC

## 2023-01-31 MED ORDER — THIAMINE MONONITRATE 100 MG PO TABS
100.0000 mg | ORAL_TABLET | Freq: Every day | ORAL | Status: DC
  Administered 2023-02-01: 100 mg via ORAL
  Filled 2023-01-31 (×2): qty 1

## 2023-01-31 MED ORDER — CLONIDINE HCL 0.1 MG PO TABS: 0.1000 mg | ORAL_TABLET | Freq: Every day | ORAL | Status: DC

## 2023-01-31 MED ORDER — THIAMINE HCL 100 MG/ML IJ SOLN
100.0000 mg | Freq: Once | INTRAMUSCULAR | Status: AC
  Administered 2023-01-31: 100 mg via INTRAMUSCULAR
  Filled 2023-01-31: qty 2

## 2023-01-31 MED ORDER — MAGNESIUM HYDROXIDE 400 MG/5ML PO SUSP: 30.0000 mL | Freq: Every day | ORAL | Status: DC | PRN

## 2023-01-31 MED ORDER — HYDROXYZINE HCL 25 MG PO TABS: 25.0000 mg | ORAL_TABLET | Freq: Four times a day (QID) | ORAL | Status: DC | PRN

## 2023-01-31 MED ORDER — ACETAMINOPHEN 325 MG PO TABS: 650.0000 mg | ORAL_TABLET | Freq: Four times a day (QID) | ORAL | Status: DC | PRN

## 2023-01-31 MED ORDER — HYDROXYZINE HCL 25 MG PO TABS
25.0000 mg | ORAL_TABLET | Freq: Four times a day (QID) | ORAL | Status: DC | PRN
  Administered 2023-01-31: 25 mg via ORAL
  Filled 2023-01-31: qty 1

## 2023-01-31 MED ORDER — DICYCLOMINE HCL 20 MG PO TABS: 20.0000 mg | ORAL_TABLET | Freq: Four times a day (QID) | ORAL | Status: DC | PRN

## 2023-01-31 MED ORDER — THIAMINE HCL 100 MG/ML IJ SOLN: 100.0000 mg | Freq: Once | INTRAMUSCULAR | Status: DC

## 2023-01-31 MED ORDER — ALUM & MAG HYDROXIDE-SIMETH 200-200-20 MG/5ML PO SUSP: 30.0000 mL | ORAL | Status: DC | PRN

## 2023-01-31 MED ORDER — LORAZEPAM 1 MG PO TABS: 1.0000 mg | ORAL_TABLET | Freq: Three times a day (TID) | ORAL | Status: DC

## 2023-01-31 MED ORDER — TRAZODONE HCL 50 MG PO TABS
50.0000 mg | ORAL_TABLET | Freq: Every evening | ORAL | Status: DC | PRN
  Administered 2023-01-31 – 2023-02-01 (×2): 50 mg via ORAL
  Filled 2023-01-31: qty 1
  Filled 2023-01-31: qty 14
  Filled 2023-01-31 (×2): qty 1

## 2023-01-31 MED ORDER — LORAZEPAM 1 MG PO TABS
1.0000 mg | ORAL_TABLET | Freq: Four times a day (QID) | ORAL | Status: DC
  Filled 2023-01-31: qty 1

## 2023-01-31 MED ORDER — ADULT MULTIVITAMIN W/MINERALS CH: 1.0000 | ORAL_TABLET | Freq: Every day | ORAL | Status: DC

## 2023-01-31 MED ORDER — ADULT MULTIVITAMIN W/MINERALS CH
1.0000 | ORAL_TABLET | Freq: Every day | ORAL | Status: DC
  Administered 2023-01-31 – 2023-02-01 (×2): 1 via ORAL
  Filled 2023-01-31 (×3): qty 1

## 2023-01-31 MED ORDER — NICOTINE 14 MG/24HR TD PT24
14.0000 mg | MEDICATED_PATCH | Freq: Every day | TRANSDERMAL | Status: DC
  Administered 2023-01-31: 14 mg via TRANSDERMAL
  Filled 2023-01-31: qty 1

## 2023-01-31 NOTE — ED Notes (Addendum)
D: Pt here voluntarily with his wife. Pt denies SI/HI/AVH and pain at this time. Pt here for detox from ecstacy, cocaine and alcohol. Pt appears to be intoxicated. Pt states he drinks (2) 40 oz beers 5 out of 7 days a week. Says he has been drinking for the last 10 years. Last drink was 10 pm 01/30/23. Pt does not take any other prescription medications. Pt does say that he wants detox. Pt had never detoxed from alcohol or other substances before. Pt reports 4 hours of sleep a night on average. Declined to talk about his stressors. Denies access to firearms. Pt is employed. Counts his sister as his main family support.   A: Pt was offered support and encouragement. Pt is cooperative during assessment. Admission paperwork signed. Belongings searched and contraband items placed in locker. Non-invasive skin search completed: pt has tattoo on right forearm. Pt offered food and drink and both accepted. Pt introduced to unit milieu by nursing staff. Q 15 minute checks were started for safety.   R: Pt in room 159. Pt safety maintained on unit.

## 2023-01-31 NOTE — Group Note (Signed)
Group Topic: Relaxation  Group Date: 01/31/2023 Start Time: 1030 End Time: 1050 Facilitators: Londell Moh, NT  Department: [facility]  Number of Participants: 5  Group Focus: relaxation Treatment Modality:  Psychoeducation Interventions utilized were Pet therapy Purpose: increase insight  Name: [name] Date of Birth: 1990/09/30  MR: [id number]    Level of Participation: Pt did not attend group Patients Problems:  Patient Active Problem List   Diagnosis Date Noted   Alcohol use disorder 01/31/2023

## 2023-01-31 NOTE — ED Provider Notes (Signed)
Facility Based Crisis Admission H&P  Date: 01/31/23 Patient Name: [name] MRN: [id number] Chief Complaint: Alcohol detox  Diagnoses:  Final diagnoses:  Alcohol use disorder  Cocaine abuse (HCC)  Polysubstance abuse (HCC)    YQM:VHQION Saxman is a 32 y/o male presenting voluntarily [facility]us [facility].  Patient reports he has been drinking alcohol and using cocaine and ecstasy and he wants to get help. Patient reports that he has been drinking about four 40 ounce beers, 3-4 grams of cocaine daily and 2  ecstasy pills daily. Patient reports that he feels powerless and needs help.  Patient reports he drank alcohol about an hour before arriving at [facility].  Patient's wife reports the patient's drinking frequency and intensity has increased and she is afraid if patient does not get help he will die.   Nurse practitioner assessed patient face-to-face and reviewed his chart.  Patient is alert oriented x 4, speech is slurred, appears drowsy,  thought processes logical and goal directed, patient denies any suicidal ideations or homicidal ideations, paranoia or manic symptoms.  Patient endorsed patient does not appear to be responding to any internal or external stimuli at this time. Patient does not appear to be any acute distress at this time. Patient denies any previous  inpatient or outpatient mental health treatment.  Patient endorses symptoms of anxiety, depression, hopelessness, worthlessness, poor sleep irritability, hopelessness, and worthlessness.  Patient endorsed ongoing visual hallucinations of seeing spirits the patient states started when his father passed away 2 years ago.  Patient denies any AVH currently. It is unclear at this time if it is more substance induced psychosis which I suspect may be the case. Patient reports that he wants to get help because he had a cocaine induced heart attack 2 years ago and he is scared  he may have another one.  Patient reports that he had some left arm pain and tremors last Wednesday and presented to [facility]. Per chart review patient was administered diazepam and lactated Ringer's to get effect and was subsequently discharged home to follow-up with his PCP.   Patient reports that he has experienced withdrawal symptoms of tremors, sweating nausea and vomiting, diarrhea, headaches and difficulty eating.  Patient is willing to be admitted for inpatient treatment.  Patient will be admitted to [facility] for crisis management, stabilization and to safely detox for alcohol.  PHQ 2-9:  Flowsheet Row ED from 01/31/2023 in [facility] Most recent reading at 01/31/2023  3:56 AM ED from 01/31/2023 in [facility] Most recent reading at 01/31/2023  3:00 AM  Thoughts that you would be better off dead, or of hurting yourself in some way Several days Several days  PHQ-9 Total Score 9 9       Flowsheet Row ED from 01/31/2023 in [facility] Most recent reading at 01/31/2023  4:32 AM ED from 01/31/2023 in [facility] Most recent reading at 01/31/2023  3:00 AM ED from 01/24/2023 in [facility] at [facility] Most recent reading at 01/24/2023 10:06 AM  C-SSRS RISK CATEGORY No Risk No Risk No Risk       Screenings    Flowsheet Row Most Recent Value  CIWA-Ar Total 0  COWS Total Score 1       Total Time spent with patient: 20 minutes  Musculoskeletal  Strength & Muscle Tone:  within normal limits Gait & Station: normal Patient leans: N/A  Psychiatric Specialty Exam  Presentation General Appearance:  Disheveled  Eye Contact: Fleeting  Speech: Slurred  Speech Volume: Decreased  Handedness: Right   Mood and Affect  Mood: Anxious  Affect: Depressed   Thought Process  Thought Processes: Coherent  Descriptions of  Associations:Intact  Orientation:Full (Time, Place and Person)  Thought Content:WDL  Diagnosis of Schizophrenia or Schizoaffective disorder in past: No   Hallucinations:Hallucinations: Auditory; Visual Description of Auditory Hallucinations: hears voices Description of Visual Hallucinations: sees spirits  Ideas of Reference:None  Suicidal Thoughts:Suicidal Thoughts: No  Homicidal Thoughts:Homicidal Thoughts: No   Sensorium  Memory: Immediate Good; Recent Good; Remote Good  Judgment: Poor  Insight: Poor   Executive Functions  Concentration: Poor  Attention Span: Fair  Recall: [employer] of Knowledge: Fair  Language: Fair   Psychomotor Activity  Psychomotor Activity: Psychomotor Activity: Normal   Assets  Assets: Desire for Improvement; Housing; Resilience; Physical Health; Social Support   Sleep  Sleep: Sleep: Fair Number of Hours of Sleep: -1   Nutritional Assessment (For OBS and FBC admissions only) Has the patient had a weight loss or gain of 10 pounds or more in the last 3 months?: No Has the patient had a decrease in food intake/or appetite?: Yes Does the patient have dental problems?: No Does the patient have eating habits or behaviors that may be indicators of an eating disorder including binging or inducing vomiting?: No Has the patient recently lost weight without trying?: 0 Has the patient been eating poorly because of a decreased appetite?: 1 Malnutrition Screening Tool Score: 1    Physical Exam HENT:     Head: Normocephalic and atraumatic.     Nose: Nose normal.  Eyes:     Pupils: Pupils are equal, round, and reactive to light.  Cardiovascular:     Rate and Rhythm: Normal rate.  Pulmonary:     Effort: Pulmonary effort is normal.  Abdominal:     General: Abdomen is flat.  Musculoskeletal:        General: Normal range of motion.  Skin:    General: Skin is warm.  Neurological:     Mental Status: He is alert and oriented  to person, place, and time.  Psychiatric:        Attention and Perception: He is inattentive.        Mood and Affect: Mood is anxious and depressed.        Speech: Speech is slurred.        Behavior: Behavior is withdrawn.        Thought Content: Thought content is not paranoid or delusional. Thought content does not include homicidal or suicidal ideation. Thought content does not include homicidal or suicidal plan.        Cognition and Memory: Cognition normal.        Judgment: Judgment is impulsive.    Review of Systems  Constitutional: Negative.   HENT: Negative.    Eyes: Negative.   Respiratory: Negative.    Cardiovascular: Negative.   Gastrointestinal: Negative.   Genitourinary: Negative.   Musculoskeletal: Negative.   Skin: Negative.   Neurological: Negative.   Endo/Heme/Allergies: Negative.   Psychiatric/Behavioral:  Positive for depression, hallucinations and substance abuse. Negative for suicidal ideas. The patient is nervous/anxious.     Blood pressure (!) 150/92, pulse (!) 109, temperature 98.3 F (36.8 C), temperature source Oral, resp. rate 20, SpO2 99 %. There is no height or weight  on file to calculate BMI.  Past Psychiatric History: Alcohol abuse, cocaine abuse, polysubstance abuse  Is the patient at risk to self? No  Has the patient been a risk to self in the past 6 months? No .    Has the patient been a risk to self within the distant past? No   Is the patient a risk to others? No   Has the patient been a risk to others in the past 6 months? No   Has the patient been a risk to others within the distant past? No   Past Medical History: MI, tremors, muslce spasms Family History: Multiple cousins with schizophrenia Social History: 32 year old male, married works as a Administrator  Last Labs:  Admission on 01/31/2023, Discharged on 01/31/2023  Component Date Value Ref Range Status   WBC 01/31/2023 7.4  4.0 - 10.5 K/uL Final   RBC 01/31/2023 6.12 (H)  4.22 -  5.81 MIL/uL Final   Hemoglobin 01/31/2023 15.7  13.0 - 17.0 g/dL Final   HCT 16/05/9603 49.7  39.0 - 52.0 % Final   MCV 01/31/2023 81.2  80.0 - 100.0 fL Final   MCH 01/31/2023 25.7 (L)  26.0 - 34.0 pg Final   MCHC 01/31/2023 31.6  30.0 - 36.0 g/dL Final   RDW 54/03/8118 14.8  11.5 - 15.5 % Final   Platelets 01/31/2023 343  150 - 400 K/uL Final   nRBC 01/31/2023 0.0  0.0 - 0.2 % Final   Neutrophils Relative % 01/31/2023 52  % Final   Neutro Abs 01/31/2023 3.8  1.7 - 7.7 K/uL Final   Lymphocytes Relative 01/31/2023 37  % Final   Lymphs Abs 01/31/2023 2.8  0.7 - 4.0 K/uL Final   Monocytes Relative 01/31/2023 7  % Final   Monocytes Absolute 01/31/2023 0.5  0.1 - 1.0 K/uL Final   Eosinophils Relative 01/31/2023 3  % Final   Eosinophils Absolute 01/31/2023 0.2  0.0 - 0.5 K/uL Final   Basophils Relative 01/31/2023 1  % Final   Basophils Absolute 01/31/2023 0.1  0.0 - 0.1 K/uL Final   Immature Granulocytes 01/31/2023 0  % Final   Abs Immature Granulocytes 01/31/2023 0.02  0.00 - 0.07 K/uL Final   Performed at [facility], [address], [place], [place] [address]   Sodium 01/31/2023 136  135 - 145 mmol/L Final   Potassium 01/31/2023 3.7  3.5 - 5.1 mmol/L Final   Chloride 01/31/2023 102  98 - 111 mmol/L Final   CO2 01/31/2023 20 (L)  22 - 32 mmol/L Final   Glucose, Bld 01/31/2023 149 (H)  70 - 99 mg/dL Final   Glucose reference range applies only to samples taken after fasting for at least 8 hours.   BUN 01/31/2023 <5 (L)  6 - 20 mg/dL Final   Creatinine, Ser 01/31/2023 0.76  0.61 - 1.24 mg/dL Final   Calcium 95/62/1308 9.5  8.9 - 10.3 mg/dL Final   Total Protein 65/78/4696 7.2  6.5 - 8.1 g/dL Final   Albumin 29/52/8413 3.7  3.5 - 5.0 g/dL Final   AST 24/40/1027 38  15 - 41 U/L Final   ALT 01/31/2023 48 (H)  0 - 44 U/L Final   Alkaline Phosphatase 01/31/2023 47  38 - 126 U/L Final   Total Bilirubin 01/31/2023 0.5  0.3 - 1.2 mg/dL Final   GFR, Estimated 01/31/2023 >60  >60 mL/min  Final   Comment: (NOTE) Calculated using the CKD-EPI Creatinine Equation (2021)    Anion  gap 01/31/2023 14  5 - 15 Final   Performed at [facility], [address], [place], [place] [address]   Alcohol, Ethyl (B) 01/31/2023 188 (H)  <10 mg/dL Final   Comment: (NOTE) Lowest detectable limit for serum alcohol is 10 mg/dL.  For medical purposes only. Performed at [facility], [address], [place], [place] [address]    Cholesterol 01/31/2023 218 (H)  0 - 200 mg/dL Final   Triglycerides 09/81/1914 192 (H)  <150 mg/dL Final   HDL 78/29/5621 33 (L)  >40 mg/dL Final   Total CHOL/HDL Ratio 01/31/2023 6.6  RATIO Final   VLDL 01/31/2023 38  0 - 40 mg/dL Final   LDL Cholesterol 01/31/2023 147 (H)  0 - 99 mg/dL Final   Comment:        Total Cholesterol/HDL:CHD Risk Coronary Heart Disease Risk Table                     Men   Women  1/2 Average Risk   3.4   3.3  Average Risk       5.0   4.4  2 X Average Risk   9.6   7.1  3 X Average Risk  23.4   11.0        Use the calculated Patient Ratio above and the CHD Risk Table to determine the patient's CHD Risk.        ATP III CLASSIFICATION (LDL):  <100     mg/dL   Optimal  308-657  mg/dL   Near or Above                    Optimal  130-159  mg/dL   Borderline  846-962  mg/dL   High  >952     mg/dL   Very High Performed at [facility], [address], [place], [place] [address]    TSH 01/31/2023 0.993  0.350 - 4.500 uIU/mL Final   Comment: Performed by a 3rd Generation assay with a functional sensitivity of <=0.01 uIU/mL. Performed at [facility], [address], [place], [place] [address]    POC Amphetamine UR 01/31/2023 None Detected  NONE DETECTED (Cut Off Level 1000 ng/mL) Final   POC Secobarbital (BAR) 01/31/2023 None Detected  NONE DETECTED (Cut Off Level 300 ng/mL) Final   POC Buprenorphine (BUP) 01/31/2023 None Detected  NONE DETECTED (Cut Off Level 10 ng/mL) Final   POC Oxazepam (BZO) 01/31/2023 None  Detected  NONE DETECTED (Cut Off Level 300 ng/mL) Final   POC Cocaine UR 01/31/2023 None Detected  NONE DETECTED (Cut Off Level 300 ng/mL) Final   POC Methamphetamine UR 01/31/2023 None Detected  NONE DETECTED (Cut Off Level 1000 ng/mL) Final   POC Morphine 01/31/2023 None Detected  NONE DETECTED (Cut Off Level 300 ng/mL) Final   POC Methadone UR 01/31/2023 None Detected  NONE DETECTED (Cut Off Level 300 ng/mL) Final   POC Oxycodone UR 01/31/2023 None Detected  NONE DETECTED (Cut Off Level 100 ng/mL) Final   POC Marijuana UR 01/31/2023 None Detected  NONE DETECTED (Cut Off Level 50 ng/mL) Final  Admission on 01/24/2023, Discharged on 01/24/2023  Component Date Value Ref Range Status   Sodium 01/24/2023 135  135 - 145 mmol/L Final   Potassium 01/24/2023 3.8  3.5 - 5.1 mmol/L Final   Chloride 01/24/2023 104  98 - 111 mmol/L Final   CO2 01/24/2023 20 (L)  22 - 32  mmol/L Final   Glucose, Bld 01/24/2023 134 (H)  70 - 99 mg/dL Final   Glucose reference range applies only to samples taken after fasting for at least 8 hours.   BUN 01/24/2023 <5 (L)  6 - 20 mg/dL Final   Creatinine, Ser 01/24/2023 0.73  0.61 - 1.24 mg/dL Final   Calcium 16/05/9603 8.9  8.9 - 10.3 mg/dL Final   Total Protein 54/03/8118 7.5  6.5 - 8.1 g/dL Final   Albumin 14/78/2956 4.0  3.5 - 5.0 g/dL Final   AST 21/30/8657 47 (H)  15 - 41 U/L Final   ALT 01/24/2023 57 (H)  0 - 44 U/L Final   Alkaline Phosphatase 01/24/2023 52  38 - 126 U/L Final   Total Bilirubin 01/24/2023 0.8  0.3 - 1.2 mg/dL Final   GFR, Estimated 01/24/2023 >60  >60 mL/min Final   Comment: (NOTE) Calculated using the CKD-EPI Creatinine Equation (2021)    Anion gap 01/24/2023 11  5 - 15 Final   Performed at [facility], [address], [place], [place] [address]   WBC 01/24/2023 6.8  4.0 - 10.5 K/uL Final   RBC 01/24/2023 5.97 (H)  4.22 - 5.81 MIL/uL Final   Hemoglobin 01/24/2023 15.0  13.0 - 17.0 g/dL Final   HCT 29/52/8413 47.8  39.0 - 52.0 %  Final   MCV 01/24/2023 80.1  80.0 - 100.0 fL Final   MCH 01/24/2023 25.1 (L)  26.0 - 34.0 pg Final   MCHC 01/24/2023 31.4  30.0 - 36.0 g/dL Final   RDW 24/40/1027 15.4  11.5 - 15.5 % Final   Platelets 01/24/2023 331  150 - 400 K/uL Final   nRBC 01/24/2023 0.0  0.0 - 0.2 % Final   Neutrophils Relative % 01/24/2023 48  % Final   Neutro Abs 01/24/2023 3.3  1.7 - 7.7 K/uL Final   Lymphocytes Relative 01/24/2023 35  % Final   Lymphs Abs 01/24/2023 2.3  0.7 - 4.0 K/uL Final   Monocytes Relative 01/24/2023 10  % Final   Monocytes Absolute 01/24/2023 0.7  0.1 - 1.0 K/uL Final   Eosinophils Relative 01/24/2023 6  % Final   Eosinophils Absolute 01/24/2023 0.4  0.0 - 0.5 K/uL Final   Basophils Relative 01/24/2023 1  % Final   Basophils Absolute 01/24/2023 0.0  0.0 - 0.1 K/uL Final   Immature Granulocytes 01/24/2023 0  % Final   Abs Immature Granulocytes 01/24/2023 0.02  0.00 - 0.07 K/uL Final   Performed at [facility], [address] [place], [place] [address]   Troponin I (High Sensitivity) 01/24/2023 4  <18 ng/L Final   Comment: (NOTE) Elevated high sensitivity troponin I (hsTnI) values and significant  changes across serial measurements may suggest ACS but many other  chronic and acute conditions are known to elevate hsTnI results.  Refer to the "Links" section for chest pain algorithms and additional  guidance. Performed at [facility], [address], [place], [place] [address]    Total CK 01/24/2023 610 (H)  49 - 397 U/L Final   Performed at [facility], [address] [place], [place] [address]   Magnesium 01/24/2023 2.0  1.7 - 2.4 mg/dL Final   Performed at [facility], [address] [place], [place] [address]   Troponin I (High Sensitivity) 01/24/2023 4  <18 ng/L Final   Comment: (NOTE) Elevated high sensitivity troponin I (hsTnI) values and significant  changes across serial  measurements may suggest ACS but many other  chronic and acute conditions  are known to elevate hsTnI results.  Refer to the "Links" section for chest pain algorithms and additional  guidance. Performed at [facility], [address], [place], [place] [address]   Admission on 01/06/2023, Discharged on 01/06/2023  Component Date Value Ref Range Status   Sodium 01/06/2023 138  135 - 145 mmol/L Final   Potassium 01/06/2023 3.2 (L)  3.5 - 5.1 mmol/L Final   Chloride 01/06/2023 105  98 - 111 mmol/L Final   CO2 01/06/2023 22  22 - 32 mmol/L Final   Glucose, Bld 01/06/2023 143 (H)  70 - 99 mg/dL Final   Glucose reference range applies only to samples taken after fasting for at least 8 hours.   BUN 01/06/2023 <5 (L)  6 - 20 mg/dL Final   Creatinine, Ser 01/06/2023 0.68  0.61 - 1.24 mg/dL Final   Calcium 91/47/8295 8.6 (L)  8.9 - 10.3 mg/dL Final   GFR, Estimated 01/06/2023 >60  >60 mL/min Final   Comment: (NOTE) Calculated using the CKD-EPI Creatinine Equation (2021)    Anion gap 01/06/2023 11  5 - 15 Final   Performed at [facility], [address] [place], [place] [address]   Troponin I (High Sensitivity) 01/06/2023 2  <18 ng/L Final   Comment: (NOTE) Elevated high sensitivity troponin I (hsTnI) values and significant  changes across serial measurements may suggest ACS but many other  chronic and acute conditions are known to elevate hsTnI results.  Refer to the "Links" section for chest pain algorithms and additional  guidance. Performed at [facility], [address] [place], [place] [address]    WBC 01/06/2023 7.1  4.0 - 10.5 K/uL Final   RBC 01/06/2023 5.63  4.22 - 5.81 MIL/uL Final   Hemoglobin 01/06/2023 14.4  13.0 - 17.0 g/dL Final   HCT 46/96/2952 45.0  39.0 - 52.0 % Final   MCV 01/06/2023 79.9 (L)  80.0 - 100.0 fL Final   MCH 01/06/2023 25.6 (L)  26.0 - 34.0 pg Final   MCHC 01/06/2023 32.0  30.0 - 36.0 g/dL Final   RDW 84/13/2440 15.7 (H)  11.5 - 15.5 % Final   Platelets 01/06/2023 371  150 - 400 K/uL Final    nRBC 01/06/2023 0.0  0.0 - 0.2 % Final   Neutrophils Relative % 01/06/2023 67  % Final   Neutro Abs 01/06/2023 4.6  1.7 - 7.7 K/uL Final   Lymphocytes Relative 01/06/2023 25  % Final   Lymphs Abs 01/06/2023 1.8  0.7 - 4.0 K/uL Final   Monocytes Relative 01/06/2023 7  % Final   Monocytes Absolute 01/06/2023 0.5  0.1 - 1.0 K/uL Final   Eosinophils Relative 01/06/2023 1  % Final   Eosinophils Absolute 01/06/2023 0.1  0.0 - 0.5 K/uL Final   Basophils Relative 01/06/2023 0  % Final   Basophils Absolute 01/06/2023 0.0  0.0 - 0.1 K/uL Final   Immature Granulocytes 01/06/2023 0  % Final   Abs Immature Granulocytes 01/06/2023 0.02  0.00 - 0.07 K/uL Final   Performed at [facility], [address] [place], [place] [address]   Troponin I (High Sensitivity) 01/06/2023 2  <18 ng/L Final   Comment: (NOTE) Elevated high sensitivity troponin I (hsTnI) values and significant  changes across serial measurements may suggest ACS but many other  chronic and acute conditions are known to elevate  hsTnI results.  Refer to the "Links" section for chest pain algorithms and additional  guidance. Performed at [facility], [address] [place], [place] [address]     Allergies: Patient has no known allergies.  Medications:  Facility Ordered Medications  Medication   acetaminophen (TYLENOL) tablet 650 mg   alum & mag hydroxide-simeth (MAALOX/MYLANTA) 200-200-20 MG/5ML suspension 30 mL   magnesium hydroxide (MILK OF MAGNESIA) suspension 30 mL   dicyclomine (BENTYL) tablet 20 mg   hydrOXYzine (ATARAX) tablet 25 mg   methocarbamol (ROBAXIN) tablet 500 mg   naproxen (NAPROSYN) tablet 500 mg   [COMPLETED] thiamine (VITAMIN B1) injection 100 mg   [START ON 02/01/2023] thiamine (VITAMIN B1) tablet 100 mg   multivitamin with minerals tablet 1 tablet   LORazepam (ATIVAN) tablet 1 mg   loperamide (IMODIUM) capsule 2-4 mg   ondansetron (ZOFRAN-ODT) disintegrating tablet 4 mg   traZODone  (DESYREL) tablet 50 mg   cloNIDine (CATAPRES) tablet 0.1 mg   Followed by   Melene Muller ON 02/02/2023] cloNIDine (CATAPRES) tablet 0.1 mg   Followed by   Melene Muller ON 02/05/2023] cloNIDine (CATAPRES) tablet 0.1 mg   LORazepam (ATIVAN) tablet 1 mg   Followed by   Melene Muller ON 02/01/2023] LORazepam (ATIVAN) tablet 1 mg   Followed by   Melene Muller ON 02/02/2023] LORazepam (ATIVAN) tablet 1 mg   Followed by   Melene Muller ON 02/04/2023] LORazepam (ATIVAN) tablet 1 mg   nicotine (NICODERM CQ - dosed in mg/24 hours) patch 14 mg   PTA Medications  Medication Sig   naproxen (NAPROSYN) 500 MG tablet Take 1 tablet (500 mg total) by mouth 2 (two) times daily with a meal. (Patient not taking: Reported on 05/23/2020)   fluticasone (FLONASE) 50 MCG/ACT nasal spray Place 2 sprays into both nostrils daily.   guaiFENesin (MUCINEX) 600 MG 12 hr tablet Take 1 tablet (600 mg total) by mouth 2 (two) times daily.   oseltamivir (TAMIFLU) 75 MG capsule Take 1 capsule (75 mg total) by mouth every 12 (twelve) hours.   diclofenac Sodium (VOLTAREN) 1 % GEL Apply 4 g topically 4 (four) times daily.    Long Term Goals: Improvement in symptoms so as ready for discharge  Short Term Goals: Patient will verbalize feelings in meetings with treatment team members., Patient will attend at least of 50% of the groups daily., Pt will complete the PHQ9 on admission, day 3 and discharge., and Patient will take medications as prescribed daily. . Patient reports that he has been drinking about four 40 ounce beers, 3-4 grams of cocaine daily and 2 ecstasy pills daily. Patient reports that he feels powerless and needs help.   Medical Decision Making  Alejandra Lemmo is a 32 y/o male presenting voluntarily accompanied by his wife Stu Minotti to  [facility] for alcohol detox.  Patient reports he has been drinking alcohol and using cocaine and ecstasy and he wants to get help.    Recommendations  Based on my evaluation the patient does not appear to  have an emergency medical condition.  Patient will be admitted to [facility] for crisis management, stabilization and to safely detox for alcohol.    Jasper Riling, NP 01/31/23  5:06 AM

## 2023-01-31 NOTE — Group Note (Signed)
Group Topic: Coping Skills  Group Date: 01/31/2023 Start Time: 0230 End Time: 2040 Facilitators: Lenny Pastel  Department: [facility]  Number of Participants: 5  Group Focus: coping skills Treatment Modality:  Cognitive Behavioral Therapy Interventions utilized were assignment and exploration Purpose: enhance coping skills, explore maladaptive thinking, express feelings, increase insight, relapse prevention strategies, and trigger / craving management  Name: LANDIN ENGQUIST Date of Birth: 03-02-1991  MR: [id number]    Level of Participation: Unable to complete group on today as patients appeared to be restless and uninterested. Each patient was encouraged to participate in programming on the unit. Worksheet regarding healthy and unhealthy coping skills provided for their completion. LCSW will round with each patient to explore reaction to assignment and answers. No other needs to report at this time.   Patients Problems:  Patient Active Problem List   Diagnosis Date Noted   Alcohol use disorder 01/31/2023

## 2023-01-31 NOTE — Tx Team (Signed)
LCSW, MD, and Resident met with patient to assess current mood, affect, physical state, and inquire about needs/goals while here in [facility] and after discharge. Patient reports he presented due to needing to detox and seek further treatment. Patient reports he has been living with his wife. Patient denies having access to transportation, and reports having limited social support. Patient reports his current goal is to seek residential placement for substance use. Patient reports he has been using alcohol and cocaine for the last 13 years. Patient reports daily use of both substances and reports he affords if buy financing it himself via being self- employed. Patient reports one period of sobriety for 30 days and reports this was during the time period of covid. Patient also reports using ectasy pills daily. Patient reports having a lot of stressors. Per chart, loss of family, poor sleep and poor appetite. Patient denies SI/HI/AVH. Patient denies having any legal charges or upcoming court dates. Patient has provided permission for LCSW and team to reach out to his wife for collateral. Patient reports in this time, his goal is to seek residential placement for himself so that he can work towards recovery. Patient aware that LCSW will send referrals out for review and will follow up to provide updates as received. Patient expressed understanding and appreciation of LCSW assistance. No other needs were reported at this time by patient.    LCSW contacted the patient's wife Willard Broome [phone] to discuss disposition plans. Per wife, patient has been living in the home with her and their 4 children (two 68 year olds, a 72 year old, and a one year old). Wife reports at baseline the patient is very pleasant to be around, very attentive to her and the children. Wife reports when the patient is under the influence, patient completely disassociates, takes long periods of time away from home, and becomes hostile  when he is under the influence. Wife reports the patient has destroyed her work equipment, thrown her personal items out the house, and becomes really hostile. Wife reports the patient really needs residential treatment at this time. Wife reports she believes long term care up to 12 months as he has been struggling with this for about 13 years. Wife reports the patient has never received treatment in the past, however reports he did take a safety DUI class and attended AA meetings. Wife reports he has not remained consistent with anything. Wife reports she noticed an increase in use after the passing of his grandfather, father, and older sister all within the year of 2022-2023. Wife reports the patient also loss a best friend within their village. Wife reports she knows that patient needs help in this time and she is willing to assist him in whatever was is needed. Wife aware of protocol and is aware that LCSW will follow up with her to provide updates as able. No other needs to report at this time.   Referral will be sent to [facility] and [facility] for review. LCSW will explore additional options for the patient and will provide updates as received.    Fernande Boyden, LCSW Clinical Social Worker [facility] Ph: [phone]

## 2023-01-31 NOTE — Group Note (Signed)
Group Topic: Positive Affirmations  Group Date: 01/31/2023 Start Time: 0815 End Time: 0830 Facilitators: Jenean Lindau, RN  Department: [facility]  Number of Participants: 5  Group Focus: affirmation Treatment Modality:  Behavior Modification Therapy Interventions utilized were problem solving Purpose: express feelings  Name: MARCELLAS MASCIA Date of Birth: 1991-02-24  MR: [id number]    Level of Participation: minimal Quality of Participation: passive Interactions with others: gave feedback Mood/Affect: blunted Triggers (if applicable):   Cognition: concrete Progress: Minimal Response:   Plan: follow-up needed  Patients Problems:  Patient Active Problem List   Diagnosis Date Noted   Alcohol use disorder 01/31/2023

## 2023-01-31 NOTE — ED Notes (Signed)
Patient remains asleep in bed without issue.  Will monitor.

## 2023-01-31 NOTE — Group Note (Signed)
Group Topic: Balance in Life  Group Date: 01/31/2023 Start Time: 0800 End Time: 0900 Facilitators: Rae Lips B  Department: [facility]  Number of Participants: 4  Group Focus: abuse issues, acceptance, activities of daily living skills, anger management, and coping skills Treatment Modality:  Leisure Development Interventions utilized were leisure development Purpose: enhance coping skills, express feelings, express irrational fears, improve communication skills, increase insight, regain self-worth, and reinforce self-care  Name: [name] Date of Birth: [birth year]  MR: [id number]    Level of Participation: active Quality of Participation: attentive Interactions with others: gave feedback Mood/Affect: appropriate Triggers (if applicable): NA Cognition: coherent/clear Progress: Gaining insight Response: NA Plan: patient will be encouraged to keep going to groups.   Patients Problems:  Patient Active Problem List   Diagnosis Date Noted   Alcohol use disorder 01/31/2023

## 2023-01-31 NOTE — ED Provider Notes (Signed)
Behavioral Health Progress Note  Date and Time: 01/31/2023 11:14 AM Name: [name] MRN:  [id number]  Subjective: On treatment team interview, patient said he was brought in by "Alcohol. Cocaine."  He appeared tired throughout the interview, with a flat affect.   Patient often gave clipped, 1- or 2- word answers to interviewer questions.  Patient described a 13-year history of cocaine and alcohol use.  When asked why he decided to come in after all this time, patient said he "could not say."  Patient currently drinks 3 to 4 40-ounce bottles of beer per day.  Denies history of alcohol withdrawal.  States he "could not say" what symptoms made him come in.  Also endorses using 1.5 g of cocaine and 2 pills of ecstasy.  Per nursing note, last drink was earlier this morning. Patient used marijuana in the distant past but reports no other substance use.  Endorses vaping nicotine, for which he received a nicotine patch on intake.  Last period of sobriety was 30 days or so "around COVID", i.e. 3 or 4 years ago.  When asked what contributed to this period of sobriety patient said "willpower" without further explanation.   Per patient, wife is not actively supportive in his recovery.  He has no active social support structure at this time.  Patient said he is willing to be discharged to a long-term residential facility.  Independently brought up "[facility]" in [place] -- likely [employer], a faith-based 12-step sober living home.  Patient has no past psychiatric diagnoses.  He has never seen a psychiatrist or therapist.  Denies history of suicidal ideation or attempts.  Denies past audiovisual hallucinations. Past medical history includes myocardial infarction "around COVID".  Patient reports he was prescribed various heart medications after this event but stopped taking them after a month due to side effects.  Of note, chart review is significant for 2 presentations to the ED this past June for repeated  episodes of left arm pain -- workups at those time were negative for MI.  Current EKG 7/3 is not concerning for MI.  Patient has not seen a primary care physician in some years.  Patient has never underwent a sleep study despite being at very high risk of vascular complications related to OSA.  No other medical history. Denies relevant family medical history.  Is not taking any medications at this time.  No known allergies.  Denies present HI, SI, and AVH.  Denied legal issues at this time.  No access to firearms.  Currently works as a Administrator.  Patient consented for collateral called wife, [name] [phone]). On interview, wife describes patient's substance use problems as dating back to the beginning of their marriage 13 years ago.  She says it has only gotten worse over time in the setting of multiple recent deaths in the family.  Patient appears to have underwent severe trauma related to these deaths, which is often triggered by situations or songs.  This invariably leads to episodes of binge drinking and, subsequently, drug use.  During this time, patient can become physically aggressive.  She knows of no other drug use other than cocaine and ecstasy. Wife endorses the following symptoms during periods of patient's alcohol withdrawal: diarrhea, vomiting, fever, sweating, and his arm tightening up.  Denies seizures or hallucinations. His present state is "the worst ever seen him," noting that patient has a sleep for 2 days to recover from his binges.  She says he sometimes talks about death  and dying but has never threatened suicide or acted on suicidal thoughts.  Wife has a firearm that is currently located in a pawn shop out of husband's reach, and has a gun safe at home which husband does not have access to.  She is acutely concerned for safety and agrees with the plan for residential rehab, alongside close medical and psychological outpatient follow-up.  Diagnosis:  Final diagnoses:   Alcohol use disorder  Cocaine abuse (HCC)  Polysubstance abuse (HCC)    Total Time spent with patient: 30 minutes  Past Psychiatric History: No previous psychiatric diagnosis.  Never seen a psychiatrist or therapist.  Not currently taking any psychiatric medications.  No past trials. Past Medical History: Reported MI "a few years ago" around the time of the initial COVID outbreak. Family History: None reported. Family Psychiatric  History: Cousin with schizophrenia. Social History: As above.  Sleep: Poor, sleeps 4 to 5 hours a night interrupted  Appetite: Eats 1 or 2 meals a day  Current Medications:  Current Facility-Administered Medications  Medication Dose Route Frequency Provider Last Rate Last Admin   acetaminophen (TYLENOL) tablet 650 mg  650 mg Oral Q6H PRN Bobbitt, Shalon E, NP       alum & mag hydroxide-simeth (MAALOX/MYLANTA) 200-200-20 MG/5ML suspension 30 mL  30 mL Oral Q4H PRN Bobbitt, Shalon E, NP       dicyclomine (BENTYL) tablet 20 mg  20 mg Oral Q6H PRN Bobbitt, Shalon E, NP       hydrOXYzine (ATARAX) tablet 25 mg  25 mg Oral Q6H PRN Bobbitt, Shalon E, NP       loperamide (IMODIUM) capsule 2-4 mg  2-4 mg Oral PRN Bobbitt, Shalon E, NP       LORazepam (ATIVAN) tablet 1 mg  1 mg Oral Q6H PRN Bobbitt, Shalon E, NP       magnesium hydroxide (MILK OF MAGNESIA) suspension 30 mL  30 mL Oral Daily PRN Bobbitt, Shalon E, NP       methocarbamol (ROBAXIN) tablet 500 mg  500 mg Oral Q8H PRN Bobbitt, Shalon E, NP       multivitamin with minerals tablet 1 tablet  1 tablet Oral Daily Bobbitt, Shalon E, NP   1 tablet at 01/31/23 1008   naproxen (NAPROSYN) tablet 500 mg  500 mg Oral BID PRN Bobbitt, Shalon E, NP       nicotine (NICODERM CQ - dosed in mg/24 hours) patch 14 mg  14 mg Transdermal Daily Bobbitt, Shalon E, NP   14 mg at 01/31/23 1008   ondansetron (ZOFRAN-ODT) disintegrating tablet 4 mg  4 mg Oral Q6H PRN Bobbitt, Shalon E, NP       [START ON 02/01/2023] thiamine (VITAMIN  B1) tablet 100 mg  100 mg Oral Daily Bobbitt, Shalon E, NP       traZODone (DESYREL) tablet 50 mg  50 mg Oral QHS PRN Bobbitt, Shalon E, NP       No current outpatient medications on file.    Labs  Lab Results:  Admission on 01/31/2023, Discharged on 01/31/2023  Component Date Value Ref Range Status   WBC 01/31/2023 7.4  4.0 - 10.5 K/uL Final   RBC 01/31/2023 6.12 (H)  4.22 - 5.81 MIL/uL Final   Hemoglobin 01/31/2023 15.7  13.0 - 17.0 g/dL Final   HCT 16/05/9603 49.7  39.0 - 52.0 % Final   MCV 01/31/2023 81.2  80.0 - 100.0 fL Final   MCH 01/31/2023 25.7 (L)  26.0 - 34.0  pg Final   MCHC 01/31/2023 31.6  30.0 - 36.0 g/dL Final   RDW 16/05/9603 14.8  11.5 - 15.5 % Final   Platelets 01/31/2023 343  150 - 400 K/uL Final   nRBC 01/31/2023 0.0  0.0 - 0.2 % Final   Neutrophils Relative % 01/31/2023 52  % Final   Neutro Abs 01/31/2023 3.8  1.7 - 7.7 K/uL Final   Lymphocytes Relative 01/31/2023 37  % Final   Lymphs Abs 01/31/2023 2.8  0.7 - 4.0 K/uL Final   Monocytes Relative 01/31/2023 7  % Final   Monocytes Absolute 01/31/2023 0.5  0.1 - 1.0 K/uL Final   Eosinophils Relative 01/31/2023 3  % Final   Eosinophils Absolute 01/31/2023 0.2  0.0 - 0.5 K/uL Final   Basophils Relative 01/31/2023 1  % Final   Basophils Absolute 01/31/2023 0.1  0.0 - 0.1 K/uL Final   Immature Granulocytes 01/31/2023 0  % Final   Abs Immature Granulocytes 01/31/2023 0.02  0.00 - 0.07 K/uL Final   Performed at [facility], [address], [place], [place] [address]   Sodium 01/31/2023 136  135 - 145 mmol/L Final   Potassium 01/31/2023 3.7  3.5 - 5.1 mmol/L Final   Chloride 01/31/2023 102  98 - 111 mmol/L Final   CO2 01/31/2023 20 (L)  22 - 32 mmol/L Final   Glucose, Bld 01/31/2023 149 (H)  70 - 99 mg/dL Final   Glucose reference range applies only to samples taken after fasting for at least 8 hours.   BUN 01/31/2023 <5 (L)  6 - 20 mg/dL Final   Creatinine, Ser 01/31/2023 0.76  0.61 - 1.24 mg/dL Final    Calcium 11/91/4782 9.5  8.9 - 10.3 mg/dL Final   Total Protein 95/62/1308 7.2  6.5 - 8.1 g/dL Final   Albumin 65/78/4696 3.7  3.5 - 5.0 g/dL Final   AST 29/52/8413 38  15 - 41 U/L Final   ALT 01/31/2023 48 (H)  0 - 44 U/L Final   Alkaline Phosphatase 01/31/2023 47  38 - 126 U/L Final   Total Bilirubin 01/31/2023 0.5  0.3 - 1.2 mg/dL Final   GFR, Estimated 01/31/2023 >60  >60 mL/min Final   Comment: (NOTE) Calculated using the CKD-EPI Creatinine Equation (2021)    Anion gap 01/31/2023 14  5 - 15 Final   Performed at [facility], [address], [place], [place] [address]   Alcohol, Ethyl (B) 01/31/2023 188 (H)  <10 mg/dL Final   Comment: (NOTE) Lowest detectable limit for serum alcohol is 10 mg/dL.  For medical purposes only. Performed at [facility], [address], [place], [place] [address]    Cholesterol 01/31/2023 218 (H)  0 - 200 mg/dL Final   Triglycerides 36/64/4034 192 (H)  <150 mg/dL Final   HDL 74/25/9563 33 (L)  >40 mg/dL Final   Total CHOL/HDL Ratio 01/31/2023 6.6  RATIO Final   VLDL 01/31/2023 38  0 - 40 mg/dL Final   LDL Cholesterol 01/31/2023 147 (H)  0 - 99 mg/dL Final   Comment:        Total Cholesterol/HDL:CHD Risk Coronary Heart Disease Risk Table                     Men   Women  1/2 Average Risk   3.4   3.3  Average Risk       5.0   4.4  2 X Average Risk  9.6   7.1  3 X Average Risk  23.4   11.0        Use the calculated Patient Ratio above and the CHD Risk Table to determine the patient's CHD Risk.        ATP III CLASSIFICATION (LDL):  <100     mg/dL   Optimal  161-096  mg/dL   Near or Above                    Optimal  130-159  mg/dL   Borderline  045-409  mg/dL   High  >811     mg/dL   Very High Performed at [facility], [address], [place], [place] [address]    TSH 01/31/2023 0.993  0.350 - 4.500 uIU/mL Final   Comment: Performed by a 3rd Generation assay with a functional sensitivity of <=0.01 uIU/mL. Performed at  [facility], [address] [place], [place] [address]    POC Amphetamine UR 01/31/2023 None Detected  NONE DETECTED (Cut Off Level 1000 ng/mL) Final   POC Secobarbital (BAR) 01/31/2023 None Detected  NONE DETECTED (Cut Off Level 300 ng/mL) Final   POC Buprenorphine (BUP) 01/31/2023 None Detected  NONE DETECTED (Cut Off Level 10 ng/mL) Final   POC Oxazepam (BZO) 01/31/2023 None Detected  NONE DETECTED (Cut Off Level 300 ng/mL) Final   POC Cocaine UR 01/31/2023 None Detected  NONE DETECTED (Cut Off Level 300 ng/mL) Final   POC Methamphetamine UR 01/31/2023 None Detected  NONE DETECTED (Cut Off Level 1000 ng/mL) Final   POC Morphine 01/31/2023 None Detected  NONE DETECTED (Cut Off Level 300 ng/mL) Final   POC Methadone UR 01/31/2023 None Detected  NONE DETECTED (Cut Off Level 300 ng/mL) Final   POC Oxycodone UR 01/31/2023 None Detected  NONE DETECTED (Cut Off Level 100 ng/mL) Final   POC Marijuana UR 01/31/2023 None Detected  NONE DETECTED (Cut Off Level 50 ng/mL) Final  Admission on 01/24/2023, Discharged on 01/24/2023  Component Date Value Ref Range Status   Sodium 01/24/2023 135  135 - 145 mmol/L Final   Potassium 01/24/2023 3.8  3.5 - 5.1 mmol/L Final   Chloride 01/24/2023 104  98 - 111 mmol/L Final   CO2 01/24/2023 20 (L)  22 - 32 mmol/L Final   Glucose, Bld 01/24/2023 134 (H)  70 - 99 mg/dL Final   Glucose reference range applies only to samples taken after fasting for at least 8 hours.   BUN 01/24/2023 <5 (L)  6 - 20 mg/dL Final   Creatinine, Ser 01/24/2023 0.73  0.61 - 1.24 mg/dL Final   Calcium 13/02/6577 8.9  8.9 - 10.3 mg/dL Final   Total Protein 46/96/2952 7.5  6.5 - 8.1 g/dL Final   Albumin 84/13/2440 4.0  3.5 - 5.0 g/dL Final   AST 05/27/2535 47 (H)  15 - 41 U/L Final   ALT 01/24/2023 57 (H)  0 - 44 U/L Final   Alkaline Phosphatase 01/24/2023 52  38 - 126 U/L Final   Total Bilirubin 01/24/2023 0.8  0.3 - 1.2 mg/dL Final   GFR, Estimated 01/24/2023 >60  >60 mL/min  Final   Comment: (NOTE) Calculated using the CKD-EPI Creatinine Equation (2021)    Anion gap 01/24/2023 11  5 - 15 Final   Performed at [facility], [address], [place], [place] [address]   WBC 01/24/2023 6.8  4.0 - 10.5 K/uL Final   RBC 01/24/2023 5.97 (H)  4.22 - 5.81 MIL/uL Final   Hemoglobin 01/24/2023 15.0  13.0 - 17.0 g/dL Final   HCT 14/78/2956 47.8  39.0 - 52.0 % Final   MCV 01/24/2023 80.1  80.0 - 100.0 fL Final   MCH 01/24/2023 25.1 (L)  26.0 - 34.0 pg Final   MCHC 01/24/2023 31.4  30.0 - 36.0 g/dL Final   RDW 21/30/8657 15.4  11.5 - 15.5 % Final   Platelets 01/24/2023 331  150 - 400 K/uL Final   nRBC 01/24/2023 0.0  0.0 - 0.2 % Final   Neutrophils Relative % 01/24/2023 48  % Final   Neutro Abs 01/24/2023 3.3  1.7 - 7.7 K/uL Final   Lymphocytes Relative 01/24/2023 35  % Final   Lymphs Abs 01/24/2023 2.3  0.7 - 4.0 K/uL Final   Monocytes Relative 01/24/2023 10  % Final   Monocytes Absolute 01/24/2023 0.7  0.1 - 1.0 K/uL Final   Eosinophils Relative 01/24/2023 6  % Final   Eosinophils Absolute 01/24/2023 0.4  0.0 - 0.5 K/uL Final   Basophils Relative 01/24/2023 1  % Final   Basophils Absolute 01/24/2023 0.0  0.0 - 0.1 K/uL Final   Immature Granulocytes 01/24/2023 0  % Final   Abs Immature Granulocytes 01/24/2023 0.02  0.00 - 0.07 K/uL Final   Performed at [facility], [address], [place], [place] [address]   Troponin I (High Sensitivity) 01/24/2023 4  <18 ng/L Final   Comment: (NOTE) Elevated high sensitivity troponin I (hsTnI) values and significant  changes across serial measurements may suggest ACS but many other  chronic and acute conditions are known to elevate hsTnI results.  Refer to the "Links" section for chest pain algorithms and additional  guidance. Performed at [facility], [address] [place], [place] [address]    Total CK 01/24/2023 610 (H)  49 - 397 U/L Final   Performed at [facility], [address],  [place], [place] [address]   Magnesium 01/24/2023 2.0  1.7 - 2.4 mg/dL Final   Performed at [facility], [address], [place], [place] [address]   Troponin I (High Sensitivity) 01/24/2023 4  <18 ng/L Final   Comment: (NOTE) Elevated high sensitivity troponin I (hsTnI) values and significant  changes across serial measurements may suggest ACS but many other  chronic and acute conditions are known to elevate hsTnI results.  Refer to the "Links" section for chest pain algorithms and additional  guidance. Performed at [facility], [address] [place], [place] [address]   Admission on 01/06/2023, Discharged on 01/06/2023  Component Date Value Ref Range Status   Sodium 01/06/2023 138  135 - 145 mmol/L Final   Potassium 01/06/2023 3.2 (L)  3.5 - 5.1 mmol/L Final   Chloride 01/06/2023 105  98 - 111 mmol/L Final   CO2 01/06/2023 22  22 - 32 mmol/L Final   Glucose, Bld 01/06/2023 143 (H)  70 - 99 mg/dL Final   Glucose reference range applies only to samples taken after fasting for at least 8 hours.   BUN 01/06/2023 <5 (L)  6 - 20 mg/dL Final   Creatinine, Ser 01/06/2023 0.68  0.61 - 1.24 mg/dL Final   Calcium 64/40/3474 8.6 (L)  8.9 - 10.3 mg/dL Final   GFR, Estimated 01/06/2023 >60  >60 mL/min Final   Comment: (NOTE) Calculated using the CKD-EPI Creatinine Equation (2021)    Anion gap 01/06/2023 11  5 - 15 Final  Performed at [facility], [address] [place], [place] [address]   Troponin I (High Sensitivity) 01/06/2023 2  <18 ng/L Final   Comment: (NOTE) Elevated high sensitivity troponin I (hsTnI) values and significant  changes across serial measurements may suggest ACS but many other  chronic and acute conditions are known to elevate hsTnI results.  Refer to the "Links" section for chest pain algorithms and additional  guidance. Performed at [facility], [address] [place], [place] [address]    WBC 01/06/2023 7.1  4.0 - 10.5 K/uL  Final   RBC 01/06/2023 5.63  4.22 - 5.81 MIL/uL Final   Hemoglobin 01/06/2023 14.4  13.0 - 17.0 g/dL Final   HCT 82/95/6213 45.0  39.0 - 52.0 % Final   MCV 01/06/2023 79.9 (L)  80.0 - 100.0 fL Final   MCH 01/06/2023 25.6 (L)  26.0 - 34.0 pg Final   MCHC 01/06/2023 32.0  30.0 - 36.0 g/dL Final   RDW 08/65/7846 15.7 (H)  11.5 - 15.5 % Final   Platelets 01/06/2023 371  150 - 400 K/uL Final   nRBC 01/06/2023 0.0  0.0 - 0.2 % Final   Neutrophils Relative % 01/06/2023 67  % Final   Neutro Abs 01/06/2023 4.6  1.7 - 7.7 K/uL Final   Lymphocytes Relative 01/06/2023 25  % Final   Lymphs Abs 01/06/2023 1.8  0.7 - 4.0 K/uL Final   Monocytes Relative 01/06/2023 7  % Final   Monocytes Absolute 01/06/2023 0.5  0.1 - 1.0 K/uL Final   Eosinophils Relative 01/06/2023 1  % Final   Eosinophils Absolute 01/06/2023 0.1  0.0 - 0.5 K/uL Final   Basophils Relative 01/06/2023 0  % Final   Basophils Absolute 01/06/2023 0.0  0.0 - 0.1 K/uL Final   Immature Granulocytes 01/06/2023 0  % Final   Abs Immature Granulocytes 01/06/2023 0.02  0.00 - 0.07 K/uL Final   Performed at [facility], [address] [place], [place] [address]   Troponin I (High Sensitivity) 01/06/2023 2  <18 ng/L Final   Comment: (NOTE) Elevated high sensitivity troponin I (hsTnI) values and significant  changes across serial measurements may suggest ACS but many other  chronic and acute conditions are known to elevate hsTnI results.  Refer to the "Links" section for chest pain algorithms and additional  guidance. Performed at [facility], [address] [place], [place] [address]     Blood Alcohol level:  Lab Results  Component Value Date   ETH 188 (H) 01/31/2023   ETH 125 (H) 03/12/2015    Metabolic Disorder Labs: No results found for: "HGBA1C", "MPG" No results found for: "PROLACTIN" Lab Results  Component Value Date   CHOL 218 (H) 01/31/2023   TRIG 192 (H) 01/31/2023   HDL 33 (L) 01/31/2023    CHOLHDL 6.6 01/31/2023   VLDL 38 01/31/2023   LDLCALC 147 (H) 01/31/2023    Therapeutic Lab Levels: No results found for: "LITHIUM" No results found for: "VALPROATE" No results found for: "CBMZ"  Physical Findings   AUDIT    Flowsheet Row ED from 01/31/2023 in [facility]  Alcohol Use Disorder Identification Test Final Score (AUDIT) 29      PHQ2-9    Flowsheet Row ED from 01/31/2023 in [facility] Most recent reading at 01/31/2023  3:56 AM ED from 01/31/2023 in [facility] Most recent reading at 01/31/2023  3:00 AM  PHQ-2 Total Score 2 2  PHQ-9 Total Score 9 9      Flowsheet Row ED from 01/31/2023 in [facility] Most recent reading at 01/31/2023  4:32 AM ED from 01/31/2023 in [facility] Most recent reading at 01/31/2023  3:00 AM ED from 01/24/2023 in [facility] Emergency Department at [facility] Most recent reading at 01/24/2023 10:06 AM  C-SSRS RISK CATEGORY No Risk No Risk No Risk        Musculoskeletal  Strength & Muscle Tone: Within normal limits Gait & Station: Within normal limits Patient leans: N/A  Psychiatric Specialty Exam  Presentation  General Appearance:  Appropriate for Environment  Eye Contact: Minimal  Speech: Slow  Speech Volume: Decreased  Handedness: Right   Mood and Affect  Mood: -- (Tired)  Affect: Congruent; Constricted   Thought Process  Thought Processes: Coherent; Goal Directed; Linear  Descriptions of Associations:Intact  Orientation:Full (Time, Place and Person)  Thought Content:Logical  Diagnosis of Schizophrenia or Schizoaffective disorder in past: No    Hallucinations:Hallucinations: None   Ideas of Reference:None  Suicidal Thoughts:No  Homicidal Thoughts:No   Sensorium  Memory: Immediate Fair; Remote Poor  Judgment: Poor  Insight: Poor   Executive  Functions  Concentration: Poor  Attention Span: Fair  Recall: [employer] of Knowledge: Fair  Language: Fair   Psychomotor Activity  Psychomotor Activity: Psychomotor Activity: Decreased   Assets  Assets: Desire for Improvement; Housing; Resilience; Physical Health; Social Support   Sleep  Sleep: Sleep: Poor Number of Hours of Sleep: -1   Nutritional Assessment (For OBS and FBC admissions only) Has the patient had a weight loss or gain of 10 pounds or more in the last 3 months?: No Has the patient had a decrease in food intake/or appetite?: Yes Does the patient have dental problems?: No Does the patient have eating habits or behaviors that may be indicators of an eating disorder including binging or inducing vomiting?: No Has the patient recently lost weight without trying?: 0 Has the patient been eating poorly because of a decreased appetite?: 1 Malnutrition Screening Tool Score: 1    Physical Exam  Physical Exam Constitutional:      General: He is not in acute distress.    Appearance: He is obese. He is not diaphoretic.     Comments: Tired  HENT:     Head: Normocephalic and atraumatic.  Eyes:     Extraocular Movements: Extraocular movements intact.  Pulmonary:     Effort: Pulmonary effort is normal.  Abdominal:     General: There is distension.  Musculoskeletal:        General: Normal range of motion.  Neurological:     Mental Status: He is alert and oriented to person, place, and time. Mental status is at baseline.   Review of Systems  Constitutional:  Positive for malaise/fatigue.  Neurological:  Positive for headaches.  Psychiatric/Behavioral:  Negative for hallucinations and suicidal ideas. The patient has insomnia.    Blood pressure (!) 150/92, pulse (!) 109, temperature 98.3 F (36.8 C), temperature source Oral, resp. rate 20, SpO2 99 %. There is no height or weight on file to calculate BMI.  Treatment Plan Summary: Daily contact with  patient to assess and evaluate symptoms and progress in treatment  Mr. Cruise is a 32 year old male with a past medical history of reported MI and palpitations and no recorded past psychiatric history who presents to facility-based crisis care for  alcohol detox.  At this time, patient meets criteria for Stimulant use disorder, severe, cocaine-type and Alcohol use disorder, severe.  Presentation is also highly concerning for unspecified, severe psychiatric trauma and untreated obstructive sleep apnea, which possibly contributed to daily cocaine use, cardiovascular issues, mood instability, and general malaise.  Per collateral recall with wife, recent traumatic experiences indicate close psychiatric follow-up after discharge from residential drug/alcohol rehab.  Initial scheduled clonidine and Ativan have been discontinued in absence of obvious withdrawal symptoms (CIWA of 0).  Ativan will remain as needed.  Patient and wife in agreement to seek long-term residential care, which is likely his best option moving forward.  # Alcohol use disorder, severe: ongoing.  # Stimulant use disorder, severe, cocaine type: ongoing. -Social work to explore options for residential treatment facilities after discharge, 3-5 days.  Referrals sent. -Encourage patient to participate in therapeutic milieu while on unit. -Will consider naltrexone tomorrow to target alcohol cravings.  # Recent history of trauma: Ongoing. Collateral call with wife indicates of substance use is deeply tied to recent traumatic experiences involving the death of multiple family members. -Will discuss options for outpatient psychiatric referral, and close follow-up.  # History of myocardial infarction: ongoing.  EKG 7/3 indicates left atrial enlargement. # Concern for OSA: ongoing.  BMI 43.  Patient snores loudly on the unit.  He reports extensive cardiac history unusual for somebody in his early 70s. # Insomnia: ongoing.  Patient sleeps  averages 4 to 5 hours a night. -Begin melatonin 3 mg nightly for insomnia. -Will monitor blood pressure tomorrow and consider addition of amlodipine versus ARB, in addition to medical follow-up.  Will discuss eligibility for Medicaid before discharge.  # Disposition: Pending, likely LOS 3-5 days.  Tomie China, MD 01/31/2023 11:14 AM

## 2023-01-31 NOTE — ED Notes (Signed)
Pt asleep at this hour. No apparent distress. RR even and unlabored. Monitored for safety.  

## 2023-01-31 NOTE — ED Notes (Signed)
Pt states he will need something for sleep tonight. PRNs given as appropriate.

## 2023-01-31 NOTE — BH Assessment (Signed)
Comprehensive Clinical Assessment (CCA) Note  01/31/2023 [name] [id number]  Disposition: CCA completed by this clinician. MSE completed by Roselyn Bering, NP, who recommends Facility Based Crisis.  The patient demonstrates the following risk factors for suicide: Chronic risk factors for suicide include: substance use disorder. Acute risk factors for suicide include: unemployment. Protective factors for this patient include: positive social support, responsibility to others (children, family), and hope for the future. Considering these factors, the overall suicide risk at this point appears to be none. Patient is not appropriate for outpatient follow up.  [name] is a 32 year old married male who presents voluntarily to [facility] and accompanied by his spouse [name] [phone]). Patient reports he is seeking alcohol detox. Patient states he has also been doing cocaine and ecstasy.  Patient reports he has been using the substances since he was [age]. Patient states he drank 48oz-60oz of beer today, used 2g of cocaine two days ago and did ecstasy two weeks ago. Patient says this his longest period of sobriety was 30 days, two or three years ago. Patient reports experiencing diarrhea and cold sweats due to alcohol withdrawals.  Patient denies previous substance treatment. Patient endorses feeling depressed for some time, however, he denies ever being diagnosed and cannot specify how long he has been depressed. Patient denies SI and reports no history of self-harm. Patient denies HI, auditory or visual hallucinations. Patient denies access to guns or weapons.   Patient reports no current stressors. When discussing his depression, patient states he has been depressed since the passing of his "pops". Patient says he has had several familial losses over a four-year span. Patient reports his nephew killed his sister and his pops died a few months later.  Patient makes other statements regarding deaths; however, he is difficult to follow. Patient lives with his spouse and four children. Patient identifies God as his support. Patient reports no history of abuse. Patient denies current legal problems.   Patient states he is not receiving any mental health services. Patient reports he has had a heart attack. Per chart, patient has also made ER visits due to arm pain.   Patient presents dressed casually, alert, and oriented x4, with slurred speech. Patient is not the best historian currently. Patient appears to possibly be intoxicated, with red eyes. Patient avoids eye contact and sits slumped. Patient's mood is depressed, and he has an anxious affect. His thought process is coherent and there is no indication he is responding to internal stimuli. Patient is cooperative throughout the assessment.    Chief Complaint:  Chief Complaint  Patient presents with   Addiction Problem   Visit Diagnosis: Alcohol use disorder Substance use disorder  Cocaine use disorder   CCA Screening, Triage and Referral (STR)  Patient Reported Information How did you hear about [place]? Family/Friend  What Is the Reason for Your Visit/Call Today? Patient presents to [facility] voluntarily, accompanied by his wife requesting substance abuse treatment. Patient reports using Cocaine, Ecstasy pills and alcohol on a daily basis. Patient reports using 48oz- 60oz of beer. Patient appears to be intoxicated. Patient denies symptoms of withdrawals at this time. Patient currently denies SI,HI,AVH.  How Long Has This Been Causing You Problems? > than 6 months  What Do You Feel Would Help You the Most Today? Alcohol or Drug Use Treatment   Have You Recently Had Any Thoughts About Hurting Yourself? No  Are You Planning to Commit Suicide/Harm Yourself At This time? No   Flowsheet  Row ED from 01/31/2023 in [facility] Most recent reading at 01/31/2023  3:56 AM ED  from 01/31/2023 in [facility] Most recent reading at 01/31/2023  3:00 AM ED from 01/24/2023 in [facility] Emergency Department at [facility] Most recent reading at 01/24/2023 10:06 AM  C-SSRS RISK CATEGORY No Risk No Risk No Risk       Have you Recently Had Thoughts About Hurting Someone Karolee Ohs? No  Are You Planning to Harm Someone at This Time? No  Explanation: N/A   Have You Used Any Alcohol or Drugs in the Past 24 Hours? Yes  What Did You Use and How Much? 48oz- 60oz of beer.   Do You Currently Have a Therapist/Psychiatrist? No  Name of Therapist/Psychiatrist: Name of Therapist/Psychiatrist: N/A   Have You Been Recently Discharged From Any Office Practice or Programs? No  Explanation of Discharge From Practice/Program: N/A     CCA Screening Triage Referral Assessment Type of Contact: Face-to-Face  Telemedicine Service Delivery:   Is this Initial or Reassessment?   Date Telepsych consult ordered in [facility]:    Time Telepsych consult ordered in [facility]:    Location of Assessment: [facility] Assessment Services  Provider Location: [facility] Assessment Services   Collateral Involvement: None   Does Patient Have a Automotive engineer Guardian? No  Legal Guardian Contact Information: N/A  Copy of Legal Guardianship Form: -- (N/A)  Legal Guardian Notified of Arrival: -- (N/A)  Legal Guardian Notified of Pending Discharge: -- (N/A)  If Minor and Not Living with Parent(s), Who has Custody? N/A  Is CPS involved or ever been involved? Never  Is APS involved or ever been involved? Never   Patient Determined To Be At Risk for Harm To Self or Others Based on Review of Patient Reported Information or Presenting Complaint? No (Denies SI/HI.)  Method: No Plan (Denies SI/HI.)  Availability of Means: No access or NA (Denies SI/HI.)  Intent: Vague intent or NA (Denies SI/HI.)  Notification Required: No need or identified person (Denies  SI/HI.)  Additional Information for Danger to Others Potential: -- (N/A)  Additional Comments for Danger to Others Potential: N/A  Are There Guns or Other Weapons in Your Home? No  Types of Guns/Weapons: N/A  Are These Weapons Safely Secured?                            -- (N/A)  Who Could Verify You Are Able To Have These Secured: N/A  Do You Have any Outstanding Charges, Pending Court Dates, Parole/Probation? Patient denies.  Contacted To Inform of Risk of Harm To Self or Others: -- (N/A)    Does Patient Present under Involuntary Commitment? No    [place] of Residence: [place]   Patient Currently Receiving the Following Services: Not Receiving Services   Determination of Need: Urgent (48 hours)   Options For Referral: Outpatient Therapy; Facility-Based Crisis; Chemical Dependency Intensive Outpatient Therapy (CDIOP)     CCA Biopsychosocial Patient Reported Schizophrenia/Schizoaffective Diagnosis in Past: No   Strengths: Patient seeking treatment for substance use.   Mental Health Symptoms Depression:  Sleep (too much or little); Increase/decrease in appetite; Hopelessness   Duration of Depressive symptoms: Duration of Depressive Symptoms: Greater than two weeks   Mania:  None   Anxiety:   Restlessness   Psychosis:  None   Duration of Psychotic symptoms:    Trauma:  None   Obsessions:  None   Compulsions:  None   Inattention:  None   Hyperactivity/Impulsivity:  None   Oppositional/Defiant Behaviors:  None   Emotional Irregularity:  None   Other Mood/Personality Symptoms:  N/A    Mental Status Exam Appearance and self-care  Stature:  Tall   Weight:  Overweight   Clothing:  Casual   Grooming:  Normal   Cosmetic use:  None   Posture/gait:  Slumped   Motor activity:  Not Remarkable   Sensorium  Attention:  Distractible   Concentration:  Normal   Orientation:  Situation; Place; Person; Object   Recall/memory:  Defective in  Remote   Affect and Mood  Affect:  Flat; Depressed   Mood:  Depressed   Relating  Eye contact:  Avoided   Facial expression:  Depressed; Sad   Attitude toward examiner:  Cooperative   Thought and Language  Speech flow: Slurred   Thought content:  Appropriate to Mood and Circumstances   Preoccupation:  None   Hallucinations:  None   Organization:  Linear   Company secretary of Knowledge:  No data recorded  Intelligence:  Average   Abstraction:  Normal   Judgement:  Fair   Reality Testing:  Adequate   Insight:  Fair   Decision Making:  Impulsive   Social Functioning  Social Maturity:  Impulsive   Social Judgement:  Normal   Stress  Stressors:  Grief/losses   Coping Ability:  Contractor Deficits:  None   Supports:  Other (Comment) (God)     Religion: Religion/Spirituality Are You A Religious Person?: Yes What is Your Religious Affiliation?: Christian How Might This Affect Treatment?: N/A  Leisure/Recreation: Leisure / Recreation Do You Have Hobbies?: No  Exercise/Diet: Exercise/Diet Do You Exercise?: No Have You Gained or Lost A Significant Amount of Weight in the Past Six Months?: No Do You Follow a Special Diet?: No Do You Have Any Trouble Sleeping?: Yes Explanation of Sleeping Difficulties: Patient reports sleeping 4 hours per night.   CCA Employment/Education Employment/Work Situation: Employment / Work Situation Employment Situation: Unemployed Patient's Job has Been Impacted by Current Illness: No Has Patient ever Been in Equities trader?: No  Education: Education Is Patient Currently Attending School?: No Last Grade Completed: 10 Did You Product manager?: No Did You Have An Individualized Education Program (IIEP): No Did You Have Any Difficulty At School?: No Patient's Education Has Been Impacted by Current Illness: No   CCA Family/Childhood History Family and Relationship History: Family history Marital  status: Married Number of Years Married:  (Patient unsure.) What types of issues is patient dealing with in the relationship?: Patient denies stressors. Additional relationship information: N/A Does patient have children?: Yes How many children?: 4 How is patient's relationship with their children?: Good relationship.  Childhood History:  Childhood History By whom was/is the patient raised?: Both parents Did patient suffer any verbal/emotional/physical/sexual abuse as a child?: No Did patient suffer from severe childhood neglect?: No Has patient ever been sexually abused/assaulted/raped as an adolescent or adult?: No Was the patient ever a victim of a crime or a disaster?: No Witnessed domestic violence?: No Has patient been affected by domestic violence as an adult?: No       CCA Substance Use Alcohol/Drug Use: Alcohol / Drug Use Pain Medications: See MAR Prescriptions: See MAR Over the Counter: See MAR History of alcohol / drug use?: Yes Longest period of sobriety (when/how long): 30 day, cannot recall when Negative Consequences of Use:  (  N/A) Withdrawal Symptoms: Diarrhea, Sweats Substance #1 Name of Substance 1: Cocaine 1 - Age of First Use: 18 1 - Amount (size/oz): 2g 1 - Frequency: 3x- 4x a week 1 - Duration: Ongoing 1 - Last Use / Amount: 2 days ago 1 - Method of Aquiring: Illegal purchase 1- Route of Use: Snort Substance #2 Name of Substance 2: Alcohol 2 - Age of First Use: 17 2 - Amount (size/oz): 48- 60oz 2 - Frequency: Daily 2 - Duration: Ongoing 2 - Last Use / Amount: Today 2 - Method of Aquiring: Purchase 2 - Route of Substance Use: Orally Substance #3 Name of Substance 3: Ectasy 3 - Age of First Use: 18 3 - Amount (size/oz): 3- 4 pills. 3 - Frequency: Weekly 3 - Duration: Ongoing 3 - Last Use / Amount: 2 weeks ago 3 - Method of Aquiring: Illegal purchase 3 - Route of Substance Use: Orally                   ASAM's:  Six Dimensions of  Multidimensional Assessment  Dimension 1:  Acute Intoxication and/or Withdrawal Potential:   Dimension 1:  Description of individual's past and current experiences of substance use and withdrawal: Patient reports cold sweats and diarrhea. Patient appears intoxicated.  Dimension 2:  Biomedical Conditions and Complications:   Dimension 2:  Description of patient's biomedical conditions and  complications: Patient reports previously having a heart attack.  Dimension 3:  Emotional, Behavioral, or Cognitive Conditions and Complications:  Dimension 3:  Description of emotional, behavioral, or cognitive conditions and complications: Patient reports feeling depressed, however, states never diagnosed.  Dimension 4:  Readiness to Change:  Dimension 4:  Description of Readiness to Change criteria: Patient interested in engaging in treatment.  Dimension 5:  Relapse, Continued use, or Continued Problem Potential:  Dimension 5:  Relapse, continued use, or continued problem potential critiera description: Patient reports 30 days of sobriety. Not engaged in mental health services.  Dimension 6:  Recovery/Living Environment:  Dimension 6:  Recovery/Iiving environment criteria description: Patient accompanied by his spouse.  ASAM Severity Score: ASAM's Severity Rating Score: 5  ASAM Recommended Level of Treatment: ASAM Recommended Level of Treatment: Level II Intensive Outpatient Treatment   Substance use Disorder (SUD) Substance Use Disorder (SUD)  Checklist Symptoms of Substance Use: Persistent desire or unsuccessful efforts to cut down or control use, Presence of craving or strong urge to use, Continued use despite persistent or recurrent social, interpersonal problems, caused or exacerbated by use  Recommendations for Services/Supports/Treatments: Recommendations for Services/Supports/Treatments Recommendations For Services/Supports/Treatments: Facility Based Crisis  Discharge Disposition:    DSM5  Diagnoses: Patient Active Problem List   Diagnosis Date Noted   Alcohol use disorder 01/31/2023     Referrals to Alternative Service(s): Referred to Alternative Service(s):   Place:   Date:   Time:    Referred to Alternative Service(s):   Place:   Date:   Time:    Referred to Alternative Service(s):   Place:   Date:   Time:    Referred to Alternative Service(s):   Place:   Date:   Time:     Cleda Clarks, LCSW

## 2023-01-31 NOTE — Progress Notes (Signed)
Patient requested to speak with LCSW regarding disposition plans. Patient reports an interest in going to the [facility] in [place], [place] and asked if LCSW could follow up regarding admission process to their facility.   LCSW attempted to call the [facility] [phone] and speak with Admissions Coordinator Bradly Chris extension [phone], however received no answer. LCSW left voice message requesting return call. Number was provided to the patient for his follow up as well. No other needs were reported at this time.   Joshua Boyden, LCSW Clinical Social Worker [place] [facility] Ph: [phone]

## 2023-01-31 NOTE — ED Notes (Signed)
Pt states that his current nicotine patch dosage is not working for his cravings. Pt vapes daily. Will notify provider.

## 2023-01-31 NOTE — Progress Notes (Signed)
   01/31/23 0201  [facility] Triage Screening (Walk-ins at [facility] only)  How Did You Hear About [place]? Family/Friend  What Is the Reason for Your Visit/Call Today? Pt presents to [facility] voluntarily, accompanied by his wife requesting substance abuse treatment. Pt reports using Cocaine, Ecstasy pills and alcohol on a daily basis. Pt is unsure of amount used. Pt appears to be intoxicated. Pt denies symptoms of withdrawals at this time  Pt currently denies SI,HI,AVH.  How Long Has This Been Causing You Problems? > than 6 months  Have You Recently Had Any Thoughts About Hurting Yourself? No  Are You Planning to Commit Suicide/Harm Yourself At This time? No  Have you Recently Had Thoughts About Hurting Someone Karolee Ohs? No  Are You Planning To Harm Someone At This Time? No  Are you currently experiencing any auditory, visual or other hallucinations? No  Have You Used Any Alcohol or Drugs in the Past 24 Hours? Yes  How long ago did you use Drugs or Alcohol? earlier today  What Did You Use and How Much? beer and cocaine (unknown amount)  Do you have any current medical co-morbidities that require immediate attention? No  Clinician description of patient physical appearance/behavior: Pt is calm, cooperative  What Do You Feel Would Help You the Most Today? Alcohol or Drug Use Treatment  If access to [facility] was not available, would you have sought care in the Emergency Department? No  Determination of Need Routine (7 days)  Options For Referral Other: Comment;Outpatient Therapy;Facility-Based Crisis;Chemical Dependency Intensive Outpatient Therapy (CDIOP)

## 2023-01-31 NOTE — ED Notes (Signed)
Attempted to draw patients blood twice.  He appears to be dehydrated and has been strongly encouraged to drink water.  Will attempt again at a later time.

## 2023-01-31 NOTE — BH IP Treatment Plan (Signed)
Interdisciplinary Treatment and Diagnostic Plan Update  01/31/2023 Time of Session: 10:10AM [name] MRN: [id number]  Diagnosis:  Final diagnoses:  Alcohol use disorder  Cocaine abuse (HCC)  Polysubstance abuse (HCC)     Current Medications:  Current Facility-Administered Medications  Medication Dose Route Frequency Provider Last Rate Last Admin   acetaminophen (TYLENOL) tablet 650 mg  650 mg Oral Q6H PRN Bobbitt, Shalon E, NP       alum & mag hydroxide-simeth (MAALOX/MYLANTA) 200-200-20 MG/5ML suspension 30 mL  30 mL Oral Q4H PRN Bobbitt, Shalon E, NP       dicyclomine (BENTYL) tablet 20 mg  20 mg Oral Q6H PRN Bobbitt, Shalon E, NP       hydrOXYzine (ATARAX) tablet 25 mg  25 mg Oral Q6H PRN Bobbitt, Shalon E, NP       loperamide (IMODIUM) capsule 2-4 mg  2-4 mg Oral PRN Bobbitt, Shalon E, NP       LORazepam (ATIVAN) tablet 1 mg  1 mg Oral Q6H PRN Bobbitt, Shalon E, NP       magnesium hydroxide (MILK OF MAGNESIA) suspension 30 mL  30 mL Oral Daily PRN Bobbitt, Shalon E, NP       methocarbamol (ROBAXIN) tablet 500 mg  500 mg Oral Q8H PRN Bobbitt, Shalon E, NP       multivitamin with minerals tablet 1 tablet  1 tablet Oral Daily Bobbitt, Shalon E, NP   1 tablet at 01/31/23 1008   naproxen (NAPROSYN) tablet 500 mg  500 mg Oral BID PRN Bobbitt, Shalon E, NP       nicotine (NICODERM CQ - dosed in mg/24 hours) patch 14 mg  14 mg Transdermal Daily Bobbitt, Shalon E, NP   14 mg at 01/31/23 1008   ondansetron (ZOFRAN-ODT) disintegrating tablet 4 mg  4 mg Oral Q6H PRN Bobbitt, Shalon E, NP       [START ON 02/01/2023] thiamine (VITAMIN B1) tablet 100 mg  100 mg Oral Daily Bobbitt, Shalon E, NP       traZODone (DESYREL) tablet 50 mg  50 mg Oral QHS PRN Bobbitt, Shalon E, NP       No current outpatient medications on file.   PTA Medications: Prior to Admission medications   Not on File    Patient Stressors: Substance abuse    Patient Strengths: Ability for insight  Active sense of  humor  Average or above average intelligence  Capable of independent living  Communication skills  General fund of knowledge  Motivation for treatment/growth   Treatment Modalities: Medication Management, Group therapy, Case management,  1 to 1 session with clinician, Psychoeducation, Recreational therapy.   Physician Treatment Plan for Primary and Secondary Diagnosis:  Final diagnoses:  Alcohol use disorder  Cocaine abuse (HCC)  Polysubstance abuse (HCC)   Long Term Goal(s): Improvement in symptoms so as ready for discharge  Short Term Goals: Patient will verbalize feelings in meetings with treatment team members. Patient will attend at least of 50% of the groups daily. Pt will complete the PHQ9 on admission, day 3 and discharge. Patient will take medications as prescribed daily.  Medication Management: Evaluate patient's response, side effects, and tolerance of medication regimen.  Therapeutic Interventions: 1 to 1 sessions, Unit Group sessions and Medication administration.  Evaluation of Outcomes: Progressing  LCSW Treatment Plan for Primary Diagnosis:  Final diagnoses:  Alcohol use disorder  Cocaine abuse (HCC)  Polysubstance abuse (HCC)    Long Term Goal(s): Safe transition to appropriate next  level of care at discharge.  Short Term Goals: Facilitate acceptance of mental health diagnosis and concerns through verbal commitment to aftercare plan and appointments at discharge., Patient will identify one social support prior to discharge to aid in patient's recovery., Patient will attend AA/NA groups as scheduled., Identify minimum of 2 triggers associated with mental health/substance abuse issues with treatment team members., and Increase skills for wellness and recovery by attending 50% of scheduled groups.  Therapeutic Interventions: Assess for all discharge needs, 1 to 1 time with Child psychotherapist, Explore available resources and support systems, Assess for adequacy in  community support network, Educate family and significant other(s) on suicide prevention, Complete Psychosocial Assessment, Interpersonal group therapy.  Evaluation of Outcomes: Progressing   Progress in Treatment: Attending groups: Yes. Participating in groups: Yes. Taking medication as prescribed: Yes. Toleration medication: Yes. Family/Significant other contact made: No, will contact:  patient's wife Treyce Queener [phone] Patient understands diagnosis: Yes. Discussing patient identified problems/goals with staff: Yes. Medical problems stabilized or resolved: Yes. Denies suicidal/homicidal ideation: Yes. Issues/concerns per patient self-inventory: Yes. Other: need for detox and further treatment  New problem(s) identified: No, Describe:  other than reported on admission  New Short Term/Long Term Goal(s): Safe transition to appropriate next level of care at discharge, Engage patient in therapeutic group addressing interpersonal concerns. Engage patient in aftercare planning with referrals and resources, Increase ability to appropriately verbalize feelings, Facilitate acceptance of mental health diagnosis and concerns and Identify triggers associated with mental health/substance abuse issues.   Patient Goals:  Patient is seeking residential placement at this time for substance use and is hopeful to secure placement within 3-5 days.   Discharge Plan or Barriers: LCSW will send referrals out for review for residential placement. Updates will be provided as received.   Reason for Continuation of Hospitalization: Withdrawal symptoms  Estimated Length of Stay: 3-5 days   Last 3 [place] Suicide Severity Risk Score: Flowsheet Row ED from 01/31/2023 in [facility] Most recent reading at 01/31/2023  4:32 AM ED from 01/31/2023 in [facility] Most recent reading at 01/31/2023  3:00 AM ED from 01/24/2023 in [facility] Emergency  Department at [facility] Most recent reading at 01/24/2023 10:06 AM  C-SSRS RISK CATEGORY No Risk No Risk No Risk       Last PHQ 2/9 Scores:    01/31/2023    3:56 AM 01/31/2023    3:00 AM  Depression screen PHQ 2/9  Decreased Interest 1 1  Down, Depressed, Hopeless 1 1  PHQ - 2 Score 2 2  Altered sleeping 1 1  Tired, decreased energy 1 1  Change in appetite 1 1  Feeling bad or failure about yourself  1 1  Trouble concentrating 1 1  Moving slowly or fidgety/restless 1 1  Suicidal thoughts 1 1  PHQ-9 Score 9 9  Difficult doing work/chores Somewhat difficult Somewhat difficult    Scribe for Treatment Team: Lenny Pastel 01/31/2023 11:24 AM

## 2023-01-31 NOTE — ED Provider Notes (Signed)
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  Patient reports he has been drinking alcohol and using cocaine and ecstasy and he wants to get help. Patient will be admitted to [facility] to detox from alcohol.  PHQ 2-9:  Flowsheet Row ED from 01/31/2023 in [facility]  Thoughts that you would be better off dead, or of hurting yourself in some way Several days  PHQ-9 Total Score 9       Flowsheet Row ED from 01/31/2023 in [facility] ED from 01/24/2023 in [facility] Emergency Department at [facility] ED from 01/06/2023 in [facility] Emergency Department at [facility]  C-SSRS RISK CATEGORY No Risk No Risk No Risk         Total Time spent with patient: 20 minutes  Musculoskeletal  Strength & Muscle Tone: within normal limits Gait & Station: normal Patient leans: N/A  Psychiatric Specialty Exam  Presentation General Appearance:  Disheveled  Eye Contact: Fleeting  Speech: Slurred  Speech Volume: Decreased  Handedness: Right   Mood and Affect  Mood: Anxious; Depressed  Affect: Depressed   Thought Process  Thought Processes: Coherent  Descriptions of Associations:Intact  Orientation:Full (Time, Place and Person)  Thought Content:WDL    Hallucinations:Hallucinations: Auditory; Visual Description of Auditory Hallucinations: hears voices and sees spirits Description of Visual Hallucinations: sees spirits  Ideas of Reference:None  Suicidal Thoughts:Suicidal Thoughts: No  Homicidal Thoughts:Homicidal Thoughts: No   Sensorium  Memory: Immediate  Good; Recent Good; Remote Good  Judgment: Poor  Insight: Poor   Executive Functions  Concentration:No data recorded Attention Span: Fair  Recall: [employer] of Knowledge: Fair  Language: Fair   Psychomotor Activity  Psychomotor Activity: Psychomotor Activity: Normal   Assets  Assets: Housing; Social Support; Resilience; Physical Health; Desire for Improvement   Sleep  Sleep: Sleep: Fair Number of Hours of Sleep: -1   Nutritional Assessment (For OBS and FBC admissions only) Has the patient had a weight loss or gain of 10 pounds or more in the last 3 months?: No Has the patient had a decrease in food intake/or appetite?: Yes Does the patient have dental problems?: No Does the patient have eating habits or behaviors that may be indicators of an eating disorder including binging or inducing vomiting?: No Has the patient recently lost weight without trying?: 0 Has the patient been eating poorly because of a decreased appetite?: 1 Malnutrition Screening Tool Score: 1    Physical Exam Constitutional:      Appearance: Normal appearance.  HENT:     Head: Normocephalic and atraumatic.     Nose: Nose normal.  Eyes:     Pupils: Pupils are equal, round, and reactive to light.  Cardiovascular:     Rate and Rhythm: Normal rate.  Pulmonary:     Effort: Pulmonary effort is normal.  Abdominal:     General: Abdomen is flat.  Musculoskeletal:        General: Normal range of motion.  Skin:    General: Skin is warm.  Neurological:     Mental Status: He is alert and oriented to person, place, and  time.  Psychiatric:        Attention and Perception: He is inattentive.        Mood and Affect: Mood is anxious and depressed.        Speech: Speech is slurred.        Behavior: Behavior is cooperative.        Thought Content: Thought content does not include homicidal or suicidal ideation. Thought content does not include homicidal or suicidal plan.        Cognition and  Memory: Cognition normal.        Judgment: Judgment is impulsive.    Review of Systems  Constitutional: Negative.   HENT: Negative.    Eyes: Negative.   Respiratory: Negative.    Cardiovascular: Negative.   Gastrointestinal: Negative.   Genitourinary: Negative.   Musculoskeletal: Negative.   Skin: Negative.   Neurological: Negative.   Endo/Heme/Allergies: Negative.   Psychiatric/Behavioral:  Positive for depression and substance abuse. The patient is nervous/anxious.     Blood pressure (!) 150/92, pulse (!) 109, temperature 98.3 F (36.8 C), temperature source Oral, resp. rate 20, SpO2 99 %. There is no height or weight on file to calculate BMI.  Past Psychiatric History: Alcohol abuse, cocaine use disorder, polysubstance abuse  Is the patient at risk to self? No  Has the patient been a risk to self in the past 6 months? No .    Has the patient been a risk to self within the distant past? No   Is the patient a risk to others? No   Has the patient been a risk to others in the past 6 months? No   Has the patient been a risk to others within the distant past? 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   BUN 01/24/2023 <5 (L)  6 - 20 mg/dL Final   Creatinine, Ser 01/24/2023 0.73  0.61 - 1.24 mg/dL Final   Calcium 16/05/9603 8.9  8.9 - 10.3 mg/dL Final   Total Protein 54/03/8118 7.5  6.5 - 8.1 g/dL Final   Albumin 14/78/2956 4.0  3.5 - 5.0 g/dL  Final   AST 21/30/8657 47 (H)  15 - 41 U/L Final   ALT 01/24/2023 57 (H)  0 - 44 U/L Final   Alkaline Phosphatase 01/24/2023 52  38 - 126 U/L Final   Total Bilirubin 01/24/2023 0.8  0.3 - 1.2 mg/dL Final   GFR, Estimated 01/24/2023 >60  >60 mL/min Final   Comment: (NOTE) Calculated using the CKD-EPI Creatinine Equation (2021)    Anion gap 01/24/2023 11  5 - 15 Final   Performed at [facility], [address], [place], [place] [address]   WBC 01/24/2023 6.8  4.0 - 10.5 K/uL Final   RBC 01/24/2023 5.97 (H)  4.22 - 5.81 MIL/uL Final   Hemoglobin 01/24/2023 15.0  13.0 - 17.0  g/dL Final   HCT 16/05/9603 47.8  39.0 - 52.0 % Final   MCV 01/24/2023 80.1  80.0 - 100.0 fL Final   MCH 01/24/2023 25.1 (L)  26.0 - 34.0 pg Final   MCHC 01/24/2023 31.4  30.0 - 36.0 g/dL Final   RDW 54/03/8118 15.4  11.5 - 15.5 % Final   Platelets 01/24/2023 331  150 - 400 K/uL Final   nRBC 01/24/2023 0.0  0.0 - 0.2 % Final   Neutrophils Relative % 01/24/2023 48  % Final   Neutro Abs 01/24/2023 3.3  1.7 - 7.7 K/uL Final   Lymphocytes Relative 01/24/2023 35  % Final   Lymphs Abs 01/24/2023 2.3  0.7 - 4.0 K/uL Final   Monocytes Relative 01/24/2023 10  % Final   Monocytes Absolute 01/24/2023 0.7  0.1 - 1.0 K/uL Final   Eosinophils Relative 01/24/2023 6  % Final   Eosinophils Absolute 01/24/2023 0.4  0.0 - 0.5 K/uL Final   Basophils Relative 01/24/2023 1  % Final   Basophils Absolute 01/24/2023 0.0  0.0 - 0.1 K/uL Final   Immature Granulocytes 01/24/2023 0  % Final   Abs Immature Granulocytes 01/24/2023 0.02  0.00 - 0.07 K/uL Final   Performed at [facility], [address], [place], [place] [address]   Troponin I (High Sensitivity) 01/24/2023 4  <18 ng/L Final   Comment: (NOTE) Elevated high sensitivity troponin I (hsTnI) values and significant  changes across serial measurements may suggest ACS but many other  chronic and acute conditions are known to elevate hsTnI results.  Refer to the "Links"  section for chest pain algorithms and additional  guidance. Performed at [facility], [address], [place], [place] [address]    Total CK 01/24/2023 610 (H)  49 - 397 U/L Final   Performed at [facility], [address], [place], [place] [address]   Magnesium 01/24/2023 2.0  1.7 - 2.4 mg/dL Final   Performed at [facility], [address] [place], [place] [address]   Troponin I (High Sensitivity) 01/24/2023 4  <18 ng/L Final   Comment: (NOTE) Elevated high sensitivity troponin I (hsTnI) values and significant  changes across serial measurements may suggest ACS but many other  chronic and acute conditions are known to elevate hsTnI results.  Refer to the "Links" section for chest pain algorithms and additional  guidance. Performed at [facility], [address] [place], [place] [address]   Admission on 01/06/2023, Discharged on 01/06/2023  Component Date Value Ref Range Status   Sodium 01/06/2023 138  135 - 145 mmol/L Final   Potassium 01/06/2023 3.2 (L)  3.5 - 5.1 mmol/L Final   Chloride 01/06/2023 105  98 - 111 mmol/L Final   CO2 01/06/2023 22  22 - 32 mmol/L Final   Glucose, Bld 01/06/2023 143 (H)  70 - 99 mg/dL Final   Glucose reference range applies only to samples taken after fasting for at least 8 hours.   BUN 01/06/2023 <5 (L)  6 - 20 mg/dL Final   Creatinine, Ser 01/06/2023 0.68  0.61 - 1.24 mg/dL Final   Calcium 84/13/2440 8.6 (L)  8.9 - 10.3 mg/dL Final   GFR, Estimated 01/06/2023 >60  >60 mL/min Final   Comment: (NOTE) Calculated using the CKD-EPI Creatinine Equation (2021)    Anion gap 01/06/2023 11  5 - 15 Final   Performed at [facility], [address] [place],  [place] [address]   Troponin I (High Sensitivity) 01/06/2023 2  <18 ng/L Final   Comment: (NOTE) Elevated high sensitivity troponin I (hsTnI) values and significant  changes across serial measurements may suggest ACS but many other  chronic and acute  conditions are known to elevate hsTnI results.  Refer to the "Links" section for chest pain algorithms and additional  guidance. Performed at [facility], [address] [place], [place] [address]    WBC 01/06/2023 7.1  4.0 - 10.5 K/uL Final   RBC 01/06/2023 5.63  4.22 - 5.81 MIL/uL Final   Hemoglobin 01/06/2023 14.4  13.0 - 17.0 g/dL Final   HCT 09/81/1914 45.0  39.0 - 52.0 % Final   MCV 01/06/2023 79.9 (L)  80.0 - 100.0 fL Final   MCH 01/06/2023 25.6 (L)  26.0 - 34.0 pg Final   MCHC 01/06/2023 32.0  30.0 - 36.0 g/dL Final   RDW 78/29/5621 15.7 (H)  11.5 - 15.5 % Final   Platelets 01/06/2023 371  150 - 400 K/uL Final   nRBC 01/06/2023 0.0  0.0 - 0.2 % Final   Neutrophils Relative % 01/06/2023 67  % Final   Neutro Abs 01/06/2023 4.6  1.7 - 7.7 K/uL Final   Lymphocytes Relative 01/06/2023 25  % Final   Lymphs Abs 01/06/2023 1.8  0.7 - 4.0 K/uL Final   Monocytes Relative 01/06/2023 7  % Final   Monocytes Absolute 01/06/2023 0.5  0.1 - 1.0 K/uL Final   Eosinophils Relative 01/06/2023 1  % Final   Eosinophils Absolute 01/06/2023 0.1  0.0 - 0.5 K/uL Final   Basophils Relative 01/06/2023 0  % Final   Basophils Absolute 01/06/2023 0.0  0.0 - 0.1 K/uL Final   Immature Granulocytes 01/06/2023 0  % Final   Abs Immature Granulocytes 01/06/2023 0.02  0.00 - 0.07 K/uL Final   Performed at [facility], [address] [place], [place] [address]   Troponin I (High Sensitivity) 01/06/2023 2  <18 ng/L Final   Comment: (NOTE) Elevated high sensitivity troponin I (hsTnI) values and significant  changes across serial measurements may suggest ACS but many other  chronic and acute conditions are known to elevate hsTnI results.  Refer to the "Links" section for chest pain algorithms and additional  guidance. Performed at [facility], [address] [place], [place] [address]     Allergies: Patient has no known allergies.  Medications:  PTA Medications   Medication Sig   naproxen (NAPROSYN) 500 MG tablet Take 1 tablet (500 mg total) by mouth 2 (two) times daily with a meal. (Patient not taking: Reported on 05/23/2020)   fluticasone (FLONASE) 50 MCG/ACT nasal spray Place 2 sprays into both nostrils daily.   guaiFENesin (MUCINEX) 600 MG 12 hr tablet Take 1 tablet (600 mg total) by mouth 2 (two) times daily.   oseltamivir (TAMIFLU) 75 MG capsule Take 1 capsule (75 mg total) by mouth every 12 (twelve) hours.   diclofenac Sodium (VOLTAREN) 1 % GEL Apply 4 g topically 4 (four) times daily.    Long Term Goals: Improvement in symptoms so as ready for discharge  Short Term Goals: Patient will verbalize feelings in meetings with treatment team members., Patient will attend at least of 50% of the groups daily., Pt will complete the PHQ9 on admission, day 3 and discharge., Patient will participate in completing the [place] Suicide Severity Rating Scale, and Patient will take medications as prescribed daily.  Medical  Decision Making  Aiydan Strabala is a 32 y/o male presenting voluntarily accompanied by his wife Lowel Beston to to [facility] for alcohol detox.  Patient reports he has been drinking alcohol and using cocaine and ecstasy and he wants to get help.    Recommendations  Based on my evaluation the patient does not appear to have an emergency medical condition. Pt will be admitted to [facility] for alcohol detox Jasper Riling, NP 01/31/23  3:41 AM

## 2023-02-01 DIAGNOSIS — F1410 Cocaine abuse, uncomplicated: Secondary | ICD-10-CM | POA: Diagnosis not present

## 2023-02-01 DIAGNOSIS — F1020 Alcohol dependence, uncomplicated: Secondary | ICD-10-CM | POA: Diagnosis not present

## 2023-02-01 MED ORDER — BENZOCAINE 10 % MT GEL: 1.0000 | Freq: Once | OROMUCOSAL | Status: DC

## 2023-02-01 MED ORDER — NICOTINE POLACRILEX 2 MG MT GUM
2.0000 mg | CHEWING_GUM | OROMUCOSAL | Status: DC | PRN
  Administered 2023-02-01: 2 mg via ORAL
  Filled 2023-02-01: qty 1

## 2023-02-01 MED ORDER — MELATONIN 3 MG PO TABS
3.0000 mg | ORAL_TABLET | Freq: Every day | ORAL | Status: DC
  Administered 2023-02-01: 3 mg via ORAL
  Filled 2023-02-01: qty 14
  Filled 2023-02-01: qty 1

## 2023-02-01 NOTE — ED Notes (Signed)
Patient was provided with dinner 

## 2023-02-01 NOTE — Progress Notes (Signed)
Pt is asleep. Respirations are even and unlabored. No signs of acute distress noted. Staff will monitor for pt's safety. 

## 2023-02-01 NOTE — ED Notes (Signed)
Pt reports that he had nightmares last night and that is unusual for him. "It's that medicine." Pt educated that it could be a result of the withdrawal from cocaine and alcohol. CIWA=4, COWS=2.

## 2023-02-01 NOTE — Group Note (Signed)
Group Topic: Positive Affirmations  Group Date: 02/01/2023 Start Time: 1115 End Time: 1130 Facilitators: Maeola Sarah  Department: [facility]  Number of Participants: 3  Group Focus: affirmation Treatment Modality:  Psychoeducation Interventions utilized were patient education Purpose: increase insight, regain self-worth, and reinforce self-care  Name: [name] Date of Birth: 05/02/1991  MR: [id number]    Level of Participation: Patient did not attend group   Patients Problems:  Patient Active Problem List   Diagnosis Date Noted   Alcohol use disorder 01/31/2023

## 2023-02-01 NOTE — Discharge Instructions (Addendum)
[facility] [address], [place], [address] [phone] phone  New Patient Assessment/Therapy Walk-Ins:  Monday and Wednesday: 8 am until slots are full. Every 1st and 2nd Fridays of the month: 1 pm - 5 pm.  NO ASSESSMENT/THERAPY WALK-INS ON TUESDAYS OR THURSDAYS  New Patient Assessment/Medication Management Walk-Ins:  Monday - Friday:  8 am - 11 am.  For all walk-ins, we ask that you arrive by 7:30 am because patients will be seen in the order of arrival.  Availability is limited; therefore, you may not be seen on the same day that you walk-in.  Our goal is to serve and meet the needs of our community to the best of our ability.  Primary Care Provider Options   [facility] [address], [place], [place] [address] Monday-Friday: 8 AM-5:30 PM Phone [phone]   [facility] [address], [place], [place], [address] Monday-Friday: 8 AM-4:30 PM Phone [phone] Family medicine  Patient Care at [facility] [address], [place], [place] [address] Monday-Friday: 8 AM-5 PM Phone [phone] [facility] medicine  [facility] [address] [place], [place] [address] Monday-Friday: 8 AM-5:30 PM Phone: [phone] Primary CARE    SUBSTANCE USE TREATMENT for Medicaid and State Funded/IPRS  [facility] [address], [place], [address] [phone] phone NOTE: [facility] is no longer offering IOP services.  Serves those who are low-income or have no insurance.  [facility] [address] [place], [place], [address] [phone] phone [phone] fax NOTE: Does have Substance Abuse-Intensive Outpatient Program [facility]) as well as transitional housing if eligible.  [facility] [address] [place], [place], [address] [phone] phone [phone] fax  [facility] [phone] [place], [place],  [address] [phone] phone [phone] fax  HALFWAY HOUSES:  [facility] [phone]  [employer].oxfordvacancies.com  12 STEP PROGRAMS:  Alcoholics Anonymous of [place] Software[employer]alet.be  Narcotics Anonymous of [place] [employer]ct.dk  Al-Anon of [place], [place] www.[place]alanon.org/find-meetings.html  Nar-Anon https://nar-anon.org/find-a-meetin  List of Residential placements:   [facility] in [place]: [phone]  [facility]: [phone]  [place], [place] [phone]: Male and male facility; 30-day program: (uninsured and Medicaid such as Laurena Bering, [place], [place], partners)  [facility]: [phone]; men and women's facility; 28 days; Can have Medicaid tailored plan Tour manager or Partners)  [facility]: [phone] Karoline Caldwell or Larita Fife; 28 day program; must be fully detox; tailored Medicaid or no insurance  [address] in [place], [place]; [phone]; 28 day all males program; no insurance accepted  [facility] Referral in [place]: Gabriel Rung [phone] (no insurance or Medicaid only); 90 days; outpatient services but provide housing in apartments downtown [place]  [facility] Admission: [phone]: Patient must complete phone screening for placement: [place], [place]; 6 month program; uninsured, Medicaid, and [employer].   [facility]: no insurance required; [phone]  [facility]: [phone]; Intake: Molly Maduro; Must fill out application online; Alecia Lemming Delay [phone] x [address] Mission in [place], [place]: [phone]; Admissions Coordinators Mr. Maurine Minister or Barron Alvine; 90 day program.  [facility]: [place], [place] [phone]; Co-Ed 9 month to a year program; Online application; Men entry fee is $500 (6-63months);  [employer]: [address] [place], [place] [address];  no fee or insurance required; minimum of 2 years; Highly structured; work based; Intake Coordinator is Thayer Ohm [phone]  [facility] in [place], [place]: [phone]; Fax  number is [phone]; website: www.[facility].org; Requires 3-6 page autobiography; 2 year program (18 months and then 19month transitional housing); Admission fee is $300; no insurance needed; work Automotive engineer in [place], [place]: [employer] Desk Staff: Danise Edge [phone]: They have a Men's Regenerations Program 6-7months. Free program; There is an initial $300 fee however, they are willing to work with patients regarding that. Application is online.  [facility]: Admissions [phone] Doran Heater ext [phone]; Any 7-90 day program is out of pocket; 12 month program is free of charge; there is a $275 entry fee; Patient is responsible for own transportation

## 2023-02-01 NOTE — ED Notes (Signed)
Progress note   D: Pt seen at nurse's station. Pt denies SI, HI, AVH. Pt rates pain  0/10. Pt rates anxiety  0/10 and depression  0/10. Pt states he was feeling nauseous earlier in the day but that has resolved. No other withdrawal symptoms endorsed. Pt has an order for blood draw but pt is too dehydrated. Asked pt if he has been drinking fluids today and he states that he has been trying to drink more. CIWA=0. Pt states he has been trying to stay off the phone today. Doesn't want to talk to his wife. Pt looking for rehab after discharge. Pt endorses insomnia. No other concerns noted at this time.  A: Pt provided support and encouragement. Pt given scheduled medication as prescribed. PRNs as appropriate. Q15 min checks for safety.   R: Pt safe on the unit. Will continue to monitor.

## 2023-02-01 NOTE — Group Note (Signed)
Group Topic: Identity and Relationships  Group Date: 02/01/2023 Start Time: 2000 End Time: 2100 Facilitators: Emmit Pomfret D, NT  Department: [facility]  Number of Participants: 4  Group Focus: daily focus Treatment Modality:  Psychoeducation Interventions utilized were support Purpose: reinforce self-care  Name: [name] Date of Birth: 1991/06/13  MR: [id number]    Level of Participation: moderate Quality of Participation: attentive Interactions with others: gave feedback Mood/Affect: appropriate Triggers (if applicable): n/a Cognition: concrete Progress: Significant Response: n/a Plan: follow-up needed  Patients Problems:  Patient Active Problem List   Diagnosis Date Noted   Alcohol use disorder 01/31/2023

## 2023-02-01 NOTE — Progress Notes (Signed)
Pt is asleep. Respirations are even and unlabored. No distress noted. Staff will monitor for pt's safety. 

## 2023-02-01 NOTE — Progress Notes (Signed)
Pt is awake, alert and oriented X4 with flat affect. Pt did not voice any complaints of pain or discomfort. No signs of acute distress noted. Administered scheduled meds with no issue. Pt denies current SI/HI/AVH, plan or intent. Staff will monitor for pt's safety.

## 2023-02-01 NOTE — Progress Notes (Signed)
Pt's COWS was 1 and CIWA 0.

## 2023-02-01 NOTE — Group Note (Signed)
Group Topic: Understanding Self  Group Date: 02/01/2023 Start Time: 1230 End Time: 1330 Facilitators: Lenny Pastel  Department: [facility]  Number of Participants: 5  Group Focus: clarity of thought, communication, coping skills, daily focus, feeling awareness/expression, personal responsibility, problem solving, self-awareness, self-esteem, and substance abuse education Treatment Modality:  Cognitive Behavioral Therapy Interventions utilized were exploration, group exercise, mental fitness, problem solving, story telling, and support Purpose: enhance coping skills, explore maladaptive thinking, express feelings, express irrational fears, improve communication skills, increase insight, regain self-worth, reinforce self-care, relapse prevention strategies, and trigger / craving management  Name: [name] Date of Birth: 1990/09/24  MR: [id number]    Level of Participation: withdrawn Quality of Participation: cooperative and isolative Interactions with others: withdrawn; minimal feedback provided Mood/Affect: closed / guarded, depressed, and flat Triggers (if applicable): Unable to identify  Cognition: coherent/clear Progress: Gaining insight Response: Patient participated in group on today. Patient expressed understanding of group rules and confidentiality. Patient was able to mention his first thought regarding certain words and stigmas attached to mental health and substance use. Patient reports this activity challenged him to think differently about their current circumstance and to reconsider options for coping with said stigmas. Patient interacted positively with his peers and staff. No issues to report.  Plan: referral / recommendations  Patients Problems:  Patient Active Problem List   Diagnosis Date Noted   Alcohol use disorder 01/31/2023

## 2023-02-01 NOTE — ED Notes (Signed)
Patient was provided with lunch 

## 2023-02-01 NOTE — ED Notes (Signed)
Patient is sleeping. Respirations equal and unlabored, skin warm and dry. No change in assessment or acuity. Routine safety checks conducted according to facility protocol. Will continue to monitor for safety.   

## 2023-02-01 NOTE — Discharge Planning (Signed)
LCSW attempted to call the [facility] [phone] and speak with Admissions Coordinator Bradly Chris extension [phone], however received no answer. LCSW will make an attempt to follow up with agency on tomorrow 07/05.   LCSW contacted the patient's wife Maxxon Kukura [phone] per patient's request. Patient reports he would like for his wife to bring him some clothes if able as he does not have anything here to wear. Patient reports he also did not want to be triggered by hearing his children in the background as he knows he would leave the facility to return home. LCSW spoke with the wife regarding the patient's need. Wife reports she will bring the patient some clothes on this evening and reports being appreciative of LCSW assistance. No other needs were reported at this time.   Fernande Boyden, LCSW Clinical Social Worker [facility] Ph: [phone]

## 2023-02-01 NOTE — ED Notes (Signed)
Patient was informed about breakfast time at 8am; however, patient stated he would there later, but did not get up for breakfast.

## 2023-02-01 NOTE — ED Notes (Signed)
Pt is in the dayroom watching TV with peers. Pt denies SI/HI/AVH. No acute distress noted. Will continue to monitor for safety. 

## 2023-02-01 NOTE — ED Provider Notes (Addendum)
Behavioral Health Progress Note  Date and Time: 02/01/2023 1:09 PM Name: [name] MRN:  [id number]  Subjective: NAEON.  Patient reports insomnia, nightmares, and continued nicotine cravings.  Nicotine patch increased from 14 mg to 21 mg overnight.  PRNs: Hydroxyzine 25 mg, trazodone 50 mg.  Awaiting CBC, CMP, A1c, ethanol, lipid panel, TSH.  UDS negative.  On interview, patient was laying in his bed with his face turned away from interviewer.  Patient said he did not sleep well last night, attributes this to "crazy dreams" induced by trazodone.  Discussed with patient possible etiology of nightmares including wearing a nicotine patch overnight.  Agreed to try melatonin in place of trazodone.  Otherwise, patient has no new concerns.  Actively attempting to hydrate better.  Patient again would prefer to be discharged to [facility].  Informed patient that we have reached out but have not yet heard back from them.  Denies SI, HI, and AVH.  Diagnosis:  Final diagnoses:  Alcohol use disorder  Cocaine abuse (HCC)  Polysubstance abuse (HCC)    Total Time spent with patient: 15 minutes  Past Psychiatric History: No previous psychiatric diagnosis.  Never seen a psychiatrist or therapist.  Not currently taking any psychiatric medications.  No past trials. Past Medical History: Reported MI "a few years ago" around the time of the initial COVID outbreak. Family History: None reported. Family Psychiatric  History: Cousin with schizophrenia. Social History: As above.  Sleep: Poor, sleeps 4 to 5 hours a night interrupted  Appetite: Eats 1 or 2 meals a day  Current Medications:  Current Facility-Administered Medications  Medication Dose Route Frequency Provider Last Rate Last Admin   acetaminophen (TYLENOL) tablet 650 mg  650 mg Oral Q6H PRN Bobbitt, Shalon E, NP       alum & mag hydroxide-simeth (MAALOX/MYLANTA) 200-200-20 MG/5ML suspension 30 mL  30 mL Oral Q4H PRN Bobbitt, Shalon E, NP        dicyclomine (BENTYL) tablet 20 mg  20 mg Oral Q6H PRN Bobbitt, Shalon E, NP       hydrOXYzine (ATARAX) tablet 25 mg  25 mg Oral Q6H PRN Bobbitt, Shalon E, NP   25 mg at 01/31/23 2148   loperamide (IMODIUM) capsule 2-4 mg  2-4 mg Oral PRN Bobbitt, Shalon E, NP       LORazepam (ATIVAN) tablet 1 mg  1 mg Oral Q6H PRN Bobbitt, Shalon E, NP       magnesium hydroxide (MILK OF MAGNESIA) suspension 30 mL  30 mL Oral Daily PRN Bobbitt, Shalon E, NP       melatonin tablet 3 mg  3 mg Oral QHS Tomie China, MD       methocarbamol (ROBAXIN) tablet 500 mg  500 mg Oral Q8H PRN Bobbitt, Shalon E, NP       multivitamin with minerals tablet 1 tablet  1 tablet Oral Daily Bobbitt, Shalon E, NP   1 tablet at 02/01/23 0948   naproxen (NAPROSYN) tablet 500 mg  500 mg Oral BID PRN Bobbitt, Shalon E, NP       nicotine (NICODERM CQ - dosed in mg/24 hours) patch 21 mg  21 mg Transdermal Daily Onuoha, Chinwendu V, NP   21 mg at 02/01/23 0948   ondansetron (ZOFRAN-ODT) disintegrating tablet 4 mg  4 mg Oral Q6H PRN Bobbitt, Shalon E, NP       thiamine (VITAMIN B1) tablet 100 mg  100 mg Oral Daily Bobbitt, Shalon E, NP   100 mg  at 02/01/23 0948   traZODone (DESYREL) tablet 50 mg  50 mg Oral QHS PRN Bobbitt, Shalon E, NP   50 mg at 01/31/23 2148   No current outpatient medications on file.    Labs  Lab Results:  Admission on 01/31/2023, Discharged on 01/31/2023  Component Date Value Ref Range Status   WBC 01/31/2023 7.4  4.0 - 10.5 K/uL Final   RBC 01/31/2023 6.12 (H)  4.22 - 5.81 MIL/uL Final   Hemoglobin 01/31/2023 15.7  13.0 - 17.0 g/dL Final   HCT 16/05/9603 49.7  39.0 - 52.0 % Final   MCV 01/31/2023 81.2  80.0 - 100.0 fL Final   MCH 01/31/2023 25.7 (L)  26.0 - 34.0 pg Final   MCHC 01/31/2023 31.6  30.0 - 36.0 g/dL Final   RDW 54/03/8118 14.8  11.5 - 15.5 % Final   Platelets 01/31/2023 343  150 - 400 K/uL Final   nRBC 01/31/2023 0.0  0.0 - 0.2 % Final   Neutrophils Relative % 01/31/2023 52  % Final    Neutro Abs 01/31/2023 3.8  1.7 - 7.7 K/uL Final   Lymphocytes Relative 01/31/2023 37  % Final   Lymphs Abs 01/31/2023 2.8  0.7 - 4.0 K/uL Final   Monocytes Relative 01/31/2023 7  % Final   Monocytes Absolute 01/31/2023 0.5  0.1 - 1.0 K/uL Final   Eosinophils Relative 01/31/2023 3  % Final   Eosinophils Absolute 01/31/2023 0.2  0.0 - 0.5 K/uL Final   Basophils Relative 01/31/2023 1  % Final   Basophils Absolute 01/31/2023 0.1  0.0 - 0.1 K/uL Final   Immature Granulocytes 01/31/2023 0  % Final   Abs Immature Granulocytes 01/31/2023 0.02  0.00 - 0.07 K/uL Final   Performed at [facility], [address] [place], [place] [address]   Sodium 01/31/2023 136  135 - 145 mmol/L Final   Potassium 01/31/2023 3.7  3.5 - 5.1 mmol/L Final   Chloride 01/31/2023 102  98 - 111 mmol/L Final   CO2 01/31/2023 20 (L)  22 - 32 mmol/L Final   Glucose, Bld 01/31/2023 149 (H)  70 - 99 mg/dL Final   Glucose reference range applies only to samples taken after fasting for at least 8 hours.   BUN 01/31/2023 <5 (L)  6 - 20 mg/dL Final   Creatinine, Ser 01/31/2023 0.76  0.61 - 1.24 mg/dL Final   Calcium 95/62/1308 9.5  8.9 - 10.3 mg/dL Final   Total Protein 65/78/4696 7.2  6.5 - 8.1 g/dL Final   Albumin 29/52/8413 3.7  3.5 - 5.0 g/dL Final   AST 24/40/1027 38  15 - 41 U/L Final   ALT 01/31/2023 48 (H)  0 - 44 U/L Final   Alkaline Phosphatase 01/31/2023 47  38 - 126 U/L Final   Total Bilirubin 01/31/2023 0.5  0.3 - 1.2 mg/dL Final   GFR, Estimated 01/31/2023 >60  >60 mL/min Final   Comment: (NOTE) Calculated using the CKD-EPI Creatinine Equation (2021)    Anion gap 01/31/2023 14  5 - 15 Final   Performed at [facility], [address] [place], [place] [address]   Alcohol, Ethyl (B) 01/31/2023 188 (H)  <10 mg/dL Final   Comment: (NOTE) Lowest detectable limit for serum alcohol is 10 mg/dL.  For medical purposes only. Performed at [facility], [address] [place], [place] [address]     Cholesterol 01/31/2023 218 (H)  0 - 200 mg/dL Final   Triglycerides  01/31/2023 192 (H)  <150 mg/dL Final   HDL 16/05/9603 33 (L)  >40 mg/dL Final   Total CHOL/HDL Ratio 01/31/2023 6.6  RATIO Final   VLDL 01/31/2023 38  0 - 40 mg/dL Final   LDL Cholesterol 01/31/2023 147 (H)  0 - 99 mg/dL Final   Comment:        Total Cholesterol/HDL:CHD Risk Coronary Heart Disease Risk Table                     Men   Women  1/2 Average Risk   3.4   3.3  Average Risk       5.0   4.4  2 X Average Risk   9.6   7.1  3 X Average Risk  23.4   11.0        Use the calculated Patient Ratio above and the CHD Risk Table to determine the patient's CHD Risk.        ATP III CLASSIFICATION (LDL):  <100     mg/dL   Optimal  540-981  mg/dL   Near or Above                    Optimal  130-159  mg/dL   Borderline  191-478  mg/dL   High  >295     mg/dL   Very High Performed at [facility], [address], [place], [place] [address]    TSH 01/31/2023 0.993  0.350 - 4.500 uIU/mL Final   Comment: Performed by a 3rd Generation assay with a functional sensitivity of <=0.01 uIU/mL. Performed at [facility], [address], [place], [place] [address]    POC Amphetamine UR 01/31/2023 None Detected  NONE DETECTED (Cut Off Level 1000 ng/mL) Final   POC Secobarbital (BAR) 01/31/2023 None Detected  NONE DETECTED (Cut Off Level 300 ng/mL) Final   POC Buprenorphine (BUP) 01/31/2023 None Detected  NONE DETECTED (Cut Off Level 10 ng/mL) Final   POC Oxazepam (BZO) 01/31/2023 None Detected  NONE DETECTED (Cut Off Level 300 ng/mL) Final   POC Cocaine UR 01/31/2023 None Detected  NONE DETECTED (Cut Off Level 300 ng/mL) Final   POC Methamphetamine UR 01/31/2023 None Detected  NONE DETECTED (Cut Off Level 1000 ng/mL) Final   POC Morphine 01/31/2023 None Detected  NONE DETECTED (Cut Off Level 300 ng/mL) Final   POC Methadone UR 01/31/2023 None Detected  NONE DETECTED (Cut Off Level 300 ng/mL) Final   POC Oxycodone UR  01/31/2023 None Detected  NONE DETECTED (Cut Off Level 100 ng/mL) Final   POC Marijuana UR 01/31/2023 None Detected  NONE DETECTED (Cut Off Level 50 ng/mL) Final  Admission on 01/24/2023, Discharged on 01/24/2023  Component Date Value Ref Range Status   Sodium 01/24/2023 135  135 - 145 mmol/L Final   Potassium 01/24/2023 3.8  3.5 - 5.1 mmol/L Final   Chloride 01/24/2023 104  98 - 111 mmol/L Final   CO2 01/24/2023 20 (L)  22 - 32 mmol/L Final   Glucose, Bld 01/24/2023 134 (H)  70 - 99 mg/dL Final   Glucose reference range applies only to samples taken after fasting for at least 8 hours.   BUN 01/24/2023 <5 (L)  6 - 20 mg/dL Final   Creatinine, Ser 01/24/2023 0.73  0.61 - 1.24 mg/dL Final   Calcium 46/96/2952 8.9  8.9 - 10.3 mg/dL Final   Total Protein 84/13/2440 7.5  6.5 - 8.1 g/dL Final   Albumin  01/24/2023 4.0  3.5 - 5.0 g/dL Final   AST 16/05/9603 47 (H)  15 - 41 U/L Final   ALT 01/24/2023 57 (H)  0 - 44 U/L Final   Alkaline Phosphatase 01/24/2023 52  38 - 126 U/L Final   Total Bilirubin 01/24/2023 0.8  0.3 - 1.2 mg/dL Final   GFR, Estimated 01/24/2023 >60  >60 mL/min Final   Comment: (NOTE) Calculated using the CKD-EPI Creatinine Equation (2021)    Anion gap 01/24/2023 11  5 - 15 Final   Performed at [facility], [address], [place], [place] [address]   WBC 01/24/2023 6.8  4.0 - 10.5 K/uL Final   RBC 01/24/2023 5.97 (H)  4.22 - 5.81 MIL/uL Final   Hemoglobin 01/24/2023 15.0  13.0 - 17.0 g/dL Final   HCT 11/91/4782 47.8  39.0 - 52.0 % Final   MCV 01/24/2023 80.1  80.0 - 100.0 fL Final   MCH 01/24/2023 25.1 (L)  26.0 - 34.0 pg Final   MCHC 01/24/2023 31.4  30.0 - 36.0 g/dL Final   RDW 95/62/1308 15.4  11.5 - 15.5 % Final   Platelets 01/24/2023 331  150 - 400 K/uL Final   nRBC 01/24/2023 0.0  0.0 - 0.2 % Final   Neutrophils Relative % 01/24/2023 48  % Final   Neutro Abs 01/24/2023 3.3  1.7 - 7.7 K/uL Final   Lymphocytes Relative 01/24/2023 35  % Final   Lymphs Abs  01/24/2023 2.3  0.7 - 4.0 K/uL Final   Monocytes Relative 01/24/2023 10  % Final   Monocytes Absolute 01/24/2023 0.7  0.1 - 1.0 K/uL Final   Eosinophils Relative 01/24/2023 6  % Final   Eosinophils Absolute 01/24/2023 0.4  0.0 - 0.5 K/uL Final   Basophils Relative 01/24/2023 1  % Final   Basophils Absolute 01/24/2023 0.0  0.0 - 0.1 K/uL Final   Immature Granulocytes 01/24/2023 0  % Final   Abs Immature Granulocytes 01/24/2023 0.02  0.00 - 0.07 K/uL Final   Performed at [facility], [address] [place], [place] [address]   Troponin I (High Sensitivity) 01/24/2023 4  <18 ng/L Final   Comment: (NOTE) Elevated high sensitivity troponin I (hsTnI) values and significant  changes across serial measurements may suggest ACS but many other  chronic and acute conditions are known to elevate hsTnI results.  Refer to the "Links" section for chest pain algorithms and additional  guidance. Performed at [facility], [address] [place], [place] [address]    Total CK 01/24/2023 610 (H)  49 - 397 U/L Final   Performed at [facility], [address] [place], [place] [address]   Magnesium 01/24/2023 2.0  1.7 - 2.4 mg/dL Final   Performed at [facility], [address] [place], [place] [address]   Troponin I (High Sensitivity) 01/24/2023 4  <18 ng/L Final   Comment: (NOTE) Elevated high sensitivity troponin I (hsTnI) values and significant  changes across serial measurements may suggest ACS but many other  chronic and acute conditions are known to elevate hsTnI results.  Refer to the "Links" section for chest pain algorithms and additional  guidance. Performed at [facility], [address], [place], [place] [address]   Admission on 01/06/2023, Discharged on 01/06/2023  Component Date Value Ref Range Status   Sodium 01/06/2023 138  135 - 145 mmol/L Final   Potassium 01/06/2023 3.2 (L)  3.5 - 5.1 mmol/L  Final   Chloride 01/06/2023 105  98 - 111 mmol/L  Final   CO2 01/06/2023 22  22 - 32 mmol/L Final   Glucose, Bld 01/06/2023 143 (H)  70 - 99 mg/dL Final   Glucose reference range applies only to samples taken after fasting for at least 8 hours.   BUN 01/06/2023 <5 (L)  6 - 20 mg/dL Final   Creatinine, Ser 01/06/2023 0.68  0.61 - 1.24 mg/dL Final   Calcium 16/05/9603 8.6 (L)  8.9 - 10.3 mg/dL Final   GFR, Estimated 01/06/2023 >60  >60 mL/min Final   Comment: (NOTE) Calculated using the CKD-EPI Creatinine Equation (2021)    Anion gap 01/06/2023 11  5 - 15 Final   Performed at [facility], [address] [place], [place] [address]   Troponin I (High Sensitivity) 01/06/2023 2  <18 ng/L Final   Comment: (NOTE) Elevated high sensitivity troponin I (hsTnI) values and significant  changes across serial measurements may suggest ACS but many other  chronic and acute conditions are known to elevate hsTnI results.  Refer to the "Links" section for chest pain algorithms and additional  guidance. Performed at [facility], [address] [place], [place] [address]    WBC 01/06/2023 7.1  4.0 - 10.5 K/uL Final   RBC 01/06/2023 5.63  4.22 - 5.81 MIL/uL Final   Hemoglobin 01/06/2023 14.4  13.0 - 17.0 g/dL Final   HCT 78/29/5621 45.0  39.0 - 52.0 % Final   MCV 01/06/2023 79.9 (L)  80.0 - 100.0 fL Final   MCH 01/06/2023 25.6 (L)  26.0 - 34.0 pg Final   MCHC 01/06/2023 32.0  30.0 - 36.0 g/dL Final   RDW 30/86/5784 15.7 (H)  11.5 - 15.5 % Final   Platelets 01/06/2023 371  150 - 400 K/uL Final   nRBC 01/06/2023 0.0  0.0 - 0.2 % Final   Neutrophils Relative % 01/06/2023 67  % Final   Neutro Abs 01/06/2023 4.6  1.7 - 7.7 K/uL Final   Lymphocytes Relative 01/06/2023 25  % Final   Lymphs Abs 01/06/2023 1.8  0.7 - 4.0 K/uL Final   Monocytes Relative 01/06/2023 7  % Final   Monocytes Absolute 01/06/2023 0.5  0.1 - 1.0 K/uL Final   Eosinophils Relative 01/06/2023 1  % Final   Eosinophils Absolute 01/06/2023 0.1  0.0 - 0.5 K/uL  Final   Basophils Relative 01/06/2023 0  % Final   Basophils Absolute 01/06/2023 0.0  0.0 - 0.1 K/uL Final   Immature Granulocytes 01/06/2023 0  % Final   Abs Immature Granulocytes 01/06/2023 0.02  0.00 - 0.07 K/uL Final   Performed at [facility], [address] [place], [place] [address]   Troponin I (High Sensitivity) 01/06/2023 2  <18 ng/L Final   Comment: (NOTE) Elevated high sensitivity troponin I (hsTnI) values and significant  changes across serial measurements may suggest ACS but many other  chronic and acute conditions are known to elevate hsTnI results.  Refer to the "Links" section for chest pain algorithms and additional  guidance. Performed at [facility], [address] [place], [place] [address]     Blood Alcohol level:  Lab Results  Component Value Date   ETH 188 (H) 01/31/2023   ETH 125 (H) 03/12/2015    Metabolic Disorder Labs: No results found for: "HGBA1C", "MPG" No results found for: "PROLACTIN" Lab Results  Component Value Date   CHOL  218 (H) 01/31/2023   TRIG 192 (H) 01/31/2023   HDL 33 (L) 01/31/2023   CHOLHDL 6.6 01/31/2023   VLDL 38 01/31/2023   LDLCALC 147 (H) 01/31/2023    Therapeutic Lab Levels: No results found for: "LITHIUM" No results found for: "VALPROATE" No results found for: "CBMZ"  Physical Findings   AUDIT    Flowsheet Row ED from 01/31/2023 in [facility]  Alcohol Use Disorder Identification Test Final Score (AUDIT) 29      PHQ2-9    Flowsheet Row ED from 01/31/2023 in [facility] Most recent reading at 01/31/2023  3:56 AM ED from 01/31/2023 in [facility] Most recent reading at 01/31/2023  3:00 AM  PHQ-2 Total Score 2 2  PHQ-9 Total Score 9 9      Flowsheet Row ED from 01/31/2023 in [facility] Most recent reading at 01/31/2023  4:32 AM ED from 01/31/2023 in [facility] Most recent reading at 01/31/2023  3:00 AM ED from 01/24/2023 in [facility] Emergency Department at [facility] Most recent reading at 01/24/2023 10:06 AM  C-SSRS RISK CATEGORY No Risk No Risk No Risk        Musculoskeletal  Strength & Muscle Tone: Within normal limits Gait & Station: Within normal limits Patient leans: N/A  Psychiatric Specialty Exam  Presentation  General Appearance:  Appropriate for Environment  Eye Contact: None  Speech: Normal Rate  Speech Volume: Decreased  Handedness: Right   Mood and Affect  Mood: Depressed  Affect: Congruent; Constricted   Thought Process  Thought Processes: Goal Directed; Linear; Coherent  Descriptions of Associations:Intact  Orientation:Full (Time, Place and Person)  Thought Content:Logical  Diagnosis of Schizophrenia or Schizoaffective disorder in past: No    Hallucinations:None   Ideas of Reference:None  Suicidal Thoughts:No  Homicidal Thoughts:No   Sensorium  Memory: Immediate Fair  Judgment: Fair (improving)  Insight: Fair (improving)   Executive Functions  Concentration: Poor  Attention Span: Poor  Recall: [employer] of Knowledge: Fair  Language: Fair   Psychomotor Activity: Normal   Assets  Assets: Desire for Improvement; Housing; Resilience; Physical Health; Social Support   Sleep  Sleep: Sleep: Poor (nightmares) Number of Hours of Sleep: -1   Nutritional Assessment (For OBS and FBC admissions only) Has the patient had a weight loss or gain of 10 pounds or more in the last 3 months?: No Has the patient had a decrease in food intake/or appetite?: Yes Does the patient have dental problems?: No Does the patient have eating habits or behaviors that may be indicators of an eating disorder including binging or inducing vomiting?: No Has the patient recently lost weight without trying?: 0 Has the patient been eating poorly because of a decreased  appetite?: 1 Malnutrition Screening Tool Score: 1    Physical Exam  Physical Exam Constitutional:      General: He is not in acute distress.    Appearance: He is obese. He is not diaphoretic.     Comments: Tired  HENT:     Head: Normocephalic and atraumatic.  Eyes:     Extraocular Movements: Extraocular movements intact.  Pulmonary:     Effort: Pulmonary effort is normal.  Abdominal:     General: There is distension.  Musculoskeletal:        General: Normal range of motion.  Neurological:     Mental Status: He is alert and oriented to person,  place, and time. Mental status is at baseline.  Psychiatric:     Comments: nightmares    Review of Systems  Constitutional:  Positive for malaise/fatigue.  Neurological:  Positive for headaches.  Psychiatric/Behavioral:  Negative for hallucinations and suicidal ideas. The patient has insomnia.    Blood pressure 110/81, pulse 65, temperature 97.8 F (36.6 C), temperature source Tympanic, resp. rate 18, SpO2 99 %. There is no height or weight on file to calculate BMI.  Treatment Plan Summary: Daily contact with patient to assess and evaluate symptoms and progress in treatment  Mr. Hemsworth is a 32 year old male with a past medical history of reported MI and palpitations and no recorded past psychiatric history who presents to facility-based crisis care for alcohol detox.  At this time, patient meets criteria for stimulant use disorder, severe, cocaine-type and alcohol use disorder, severe.  Presentation is also highly concerning for unspecified, severe psychiatric trauma and untreated obstructive sleep apnea, which possibly contributed to daily cocaine use, cardiovascular issues, mood instability, and general malaise.   Patient had poor sleep last night, and appears very tired this morning.  Otherwise appears psychiatric baseline.  No identifiable signs of acute alcohol withdrawal.  Remains outwardly committed to long-term recovery.  Will  revisit discussion of substance use medication management, among other topics, when patient is more awake.  # Alcohol use disorder, severe: ongoing.  # Stimulant use disorder, severe, cocaine type: ongoing. -Social work to explore options for residential treatment facilities after discharge, 3-5 days.  Preferred discharge: [facility].  Referrals sent. -Encourage patient to participate in therapeutic milieu while on unit. -Will consider naltrexone to target alcohol cravings .  # Recent history of trauma: Ongoing. Collateral call with wife indicates of substance use is deeply tied to recent traumatic experiences involving the death of multiple family members. -Will discuss options for outpatient psychiatric referral, and close medical follow-up.  # Tobacco use disorder: Patient vapes nicotine at home. -Continue nicotine patch 21 mg daily. -Will advise patient to take it off at night, as nighttime use can induce nightmares.  # History of myocardial infarction: ongoing.  EKG 7/3 indicates left atrial enlargement, no acute concern for MI. # Concern for OSA: ongoing.  BMI 43.  Patient snores loudly on the unit. # Insomnia: ongoing.  Patient sleeps averages 4 to 5 hours a night.  # Hypertension: Resolving.  Blood pressure 110/81 this morning. -Begin melatonin 3 mg nightly for insomnia. -Continue trazodone 50 mg as needed nightly. -Will continue to monitor blood pressure and consider addition of amlodipine versus ARB in addition to medical follow-up.  Will discuss eligibility for Medicaid before discharge.  # Disposition: Pending, likely LOS 3-5 days.  Tomie China, MD 02/01/2023 1:09 PM

## 2023-02-01 NOTE — ED Notes (Signed)
Pt asleep at this hour. No apparent distress. RR even and unlabored. Monitored for safety.  

## 2023-02-02 DIAGNOSIS — F1410 Cocaine abuse, uncomplicated: Secondary | ICD-10-CM | POA: Diagnosis not present

## 2023-02-02 DIAGNOSIS — F1020 Alcohol dependence, uncomplicated: Secondary | ICD-10-CM | POA: Diagnosis not present

## 2023-02-02 MED ORDER — TRAZODONE HCL 50 MG PO TABS: 50.0000 mg | ORAL_TABLET | Freq: Every evening | ORAL | 0 refills | Status: DC | PRN

## 2023-02-02 MED ORDER — NICOTINE POLACRILEX 2 MG MT GUM: 2.0000 mg | CHEWING_GUM | OROMUCOSAL | 0 refills | Status: DC | PRN

## 2023-02-02 MED ORDER — VITAMIN B-1 100 MG PO TABS: 100.0000 mg | ORAL_TABLET | Freq: Every day | ORAL | 0 refills | Status: DC

## 2023-02-02 NOTE — ED Notes (Signed)
Pt was provided breakfast this morning 

## 2023-02-02 NOTE — ED Notes (Signed)
Patient is sleeping. Respirations equal and unlabored, skin warm and dry. No change in assessment or acuity. Routine safety checks conducted according to facility protocol. Will continue to monitor for safety.   

## 2023-02-02 NOTE — Group Note (Signed)
Group Topic: Relaxation  Group Date: 02/02/2023 Start Time: 1000 End Time: 1030 Facilitators: Londell Moh, NT  Department: [facility]  Number of Participants: 7  Group Focus: daily focus Treatment Modality:  Psychoeducation Interventions utilized were leisure development Purpose: increase insight  Name: [name] Date of Birth: July 18, 1991  MR: [id number]    Level of Participation: Pt did not attend group when asked  Patients Problems:  Patient Active Problem List   Diagnosis Date Noted   Alcohol use disorder 01/31/2023

## 2023-02-02 NOTE — ED Provider Notes (Signed)
Behavioral Health Progress Note  Date and Time: 02/02/2023 8:32 AM Name: [name] MRN:  [id number]  Subjective: NAEON.  Vitals within normal limits.  CIWA is 0.  Awaiting numerous labs, including: A1c, CBC, CMP, lipid panel, TSH.  PRNs: Nicotine gum and trazodone 50 mg x 1.  No medication refusals.  On interview, patient was laying in his bed.  Patient mood is "up and down".  Otherwise doing "all right".  Wife to bring new clothes today.  [facility] was closed yesterday for the holiday, but is still interested in residential treatment.  Denies SI, HI and AVH.  Slept somewhat better this morning.  Appetite intact.  No new complaints at this time.  Diagnosis:  Final diagnoses:  Alcohol use disorder  Cocaine abuse (HCC)  Polysubstance abuse (HCC)    Total Time spent with patient: 15 minutes  Past Psychiatric History: No previous psychiatric diagnosis.  Never seen a psychiatrist or therapist.  Not currently taking any psychiatric medications.  No past trials. Past Medical History: Reported MI "a few years ago" around the time of the initial COVID outbreak. Family History: None reported. Family Psychiatric  History: Cousin with schizophrenia. Social History: As above.  Sleep: Poor, sleeps 4 to 5 hours a night interrupted  Appetite: Eats 1 or 2 meals a day  Current Medications:  Current Facility-Administered Medications  Medication Dose Route Frequency Provider Last Rate Last Admin   acetaminophen (TYLENOL) tablet 650 mg  650 mg Oral Q6H PRN Bobbitt, Shalon E, NP       alum & mag hydroxide-simeth (MAALOX/MYLANTA) 200-200-20 MG/5ML suspension 30 mL  30 mL Oral Q4H PRN Bobbitt, Shalon E, NP       dicyclomine (BENTYL) tablet 20 mg  20 mg Oral Q6H PRN Bobbitt, Shalon E, NP       hydrOXYzine (ATARAX) tablet 25 mg  25 mg Oral Q6H PRN Bobbitt, Shalon E, NP   25 mg at 01/31/23 2148   loperamide (IMODIUM) capsule 2-4 mg  2-4 mg Oral PRN Bobbitt, Shalon E, NP       LORazepam (ATIVAN)  tablet 1 mg  1 mg Oral Q6H PRN Bobbitt, Shalon E, NP       magnesium hydroxide (MILK OF MAGNESIA) suspension 30 mL  30 mL Oral Daily PRN Bobbitt, Shalon E, NP       melatonin tablet 3 mg  3 mg Oral QHS Tomie China, MD   3 mg at 02/01/23 2118   methocarbamol (ROBAXIN) tablet 500 mg  500 mg Oral Q8H PRN Bobbitt, Shalon E, NP       multivitamin with minerals tablet 1 tablet  1 tablet Oral Daily Bobbitt, Shalon E, NP   1 tablet at 02/01/23 0948   naproxen (NAPROSYN) tablet 500 mg  500 mg Oral BID PRN Bobbitt, Shalon E, NP       nicotine polacrilex (NICORETTE) gum 2 mg  2 mg Oral PRN Rayburn Go, Veronique M, NP   2 mg at 02/01/23 2118   ondansetron (ZOFRAN-ODT) disintegrating tablet 4 mg  4 mg Oral Q6H PRN Bobbitt, Shalon E, NP       thiamine (VITAMIN B1) tablet 100 mg  100 mg Oral Daily Bobbitt, Shalon E, NP   100 mg at 02/01/23 0948   traZODone (DESYREL) tablet 50 mg  50 mg Oral QHS PRN Bobbitt, Shalon E, NP   50 mg at 02/01/23 2200   No current outpatient medications on file.    Labs  Lab Results:  Admission on  01/31/2023, Discharged on 01/31/2023  Component Date Value Ref Range Status   WBC 01/31/2023 7.4  4.0 - 10.5 K/uL Final   RBC 01/31/2023 6.12 (H)  4.22 - 5.81 MIL/uL Final   Hemoglobin 01/31/2023 15.7  13.0 - 17.0 g/dL Final   HCT 09/81/1914 49.7  39.0 - 52.0 % Final   MCV 01/31/2023 81.2  80.0 - 100.0 fL Final   MCH 01/31/2023 25.7 (L)  26.0 - 34.0 pg Final   MCHC 01/31/2023 31.6  30.0 - 36.0 g/dL Final   RDW 78/29/5621 14.8  11.5 - 15.5 % Final   Platelets 01/31/2023 343  150 - 400 K/uL Final   nRBC 01/31/2023 0.0  0.0 - 0.2 % Final   Neutrophils Relative % 01/31/2023 52  % Final   Neutro Abs 01/31/2023 3.8  1.7 - 7.7 K/uL Final   Lymphocytes Relative 01/31/2023 37  % Final   Lymphs Abs 01/31/2023 2.8  0.7 - 4.0 K/uL Final   Monocytes Relative 01/31/2023 7  % Final   Monocytes Absolute 01/31/2023 0.5  0.1 - 1.0 K/uL Final   Eosinophils Relative 01/31/2023 3  % Final    Eosinophils Absolute 01/31/2023 0.2  0.0 - 0.5 K/uL Final   Basophils Relative 01/31/2023 1  % Final   Basophils Absolute 01/31/2023 0.1  0.0 - 0.1 K/uL Final   Immature Granulocytes 01/31/2023 0  % Final   Abs Immature Granulocytes 01/31/2023 0.02  0.00 - 0.07 K/uL Final   Performed at [facility], [address] [place], [place] [address]   Sodium 01/31/2023 136  135 - 145 mmol/L Final   Potassium 01/31/2023 3.7  3.5 - 5.1 mmol/L Final   Chloride 01/31/2023 102  98 - 111 mmol/L Final   CO2 01/31/2023 20 (L)  22 - 32 mmol/L Final   Glucose, Bld 01/31/2023 149 (H)  70 - 99 mg/dL Final   Glucose reference range applies only to samples taken after fasting for at least 8 hours.   BUN 01/31/2023 <5 (L)  6 - 20 mg/dL Final   Creatinine, Ser 01/31/2023 0.76  0.61 - 1.24 mg/dL Final   Calcium 78/46/9629 9.5  8.9 - 10.3 mg/dL Final   Total Protein 52/84/1324 7.2  6.5 - 8.1 g/dL Final   Albumin 40/04/2724 3.7  3.5 - 5.0 g/dL Final   AST 36/64/4034 38  15 - 41 U/L Final   ALT 01/31/2023 48 (H)  0 - 44 U/L Final   Alkaline Phosphatase 01/31/2023 47  38 - 126 U/L Final   Total Bilirubin 01/31/2023 0.5  0.3 - 1.2 mg/dL Final   GFR, Estimated 01/31/2023 >60  >60 mL/min Final   Comment: (NOTE) Calculated using the CKD-EPI Creatinine Equation (2021)    Anion gap 01/31/2023 14  5 - 15 Final   Performed at [facility], [address], [place], [place] [address]   Alcohol, Ethyl (B) 01/31/2023 188 (H)  <10 mg/dL Final   Comment: (NOTE) Lowest detectable limit for serum alcohol is 10 mg/dL.  For medical purposes only. Performed at [facility], [address], [place], [place] [address]    Cholesterol 01/31/2023 218 (H)  0 - 200 mg/dL Final   Triglycerides 56/43/3295 192 (H)  <150 mg/dL Final   HDL 18/84/1660 33 (L)  >40 mg/dL Final   Total CHOL/HDL Ratio 01/31/2023 6.6  RATIO Final   VLDL 01/31/2023 38  0 - 40 mg/dL Final   LDL Cholesterol 01/31/2023 147 (H)  0 - 99 mg/dL  Final   Comment:        Total Cholesterol/HDL:CHD Risk Coronary Heart Disease Risk Table                     Men   Women  1/2 Average Risk   3.4   3.3  Average Risk       5.0   4.4  2 X Average Risk   9.6   7.1  3 X Average Risk  23.4   11.0        Use the calculated Patient Ratio above and the CHD Risk Table to determine the patient's CHD Risk.        ATP III CLASSIFICATION (LDL):  <100     mg/dL   Optimal  161-096  mg/dL   Near or Above                    Optimal  130-159  mg/dL   Borderline  045-409  mg/dL   High  >811     mg/dL   Very High Performed at [facility], [address] [place], [place] [address]    TSH 01/31/2023 0.993  0.350 - 4.500 uIU/mL Final   Comment: Performed by a 3rd Generation assay with a functional sensitivity of <=0.01 uIU/mL. Performed at [facility], [address], [place], [place] [address]    POC Amphetamine UR 01/31/2023 None Detected  NONE DETECTED (Cut Off Level 1000 ng/mL) Final   POC Secobarbital (BAR) 01/31/2023 None Detected  NONE DETECTED (Cut Off Level 300 ng/mL) Final   POC Buprenorphine (BUP) 01/31/2023 None Detected  NONE DETECTED (Cut Off Level 10 ng/mL) Final   POC Oxazepam (BZO) 01/31/2023 None Detected  NONE DETECTED (Cut Off Level 300 ng/mL) Final   POC Cocaine UR 01/31/2023 None Detected  NONE DETECTED (Cut Off Level 300 ng/mL) Final   POC Methamphetamine UR 01/31/2023 None Detected  NONE DETECTED (Cut Off Level 1000 ng/mL) Final   POC Morphine 01/31/2023 None Detected  NONE DETECTED (Cut Off Level 300 ng/mL) Final   POC Methadone UR 01/31/2023 None Detected  NONE DETECTED (Cut Off Level 300 ng/mL) Final   POC Oxycodone UR 01/31/2023 None Detected  NONE DETECTED (Cut Off Level 100 ng/mL) Final   POC Marijuana UR 01/31/2023 None Detected  NONE DETECTED (Cut Off Level 50 ng/mL) Final  Admission on 01/24/2023, Discharged on 01/24/2023  Component Date Value Ref Range Status   Sodium 01/24/2023 135  135 - 145 mmol/L  Final   Potassium 01/24/2023 3.8  3.5 - 5.1 mmol/L Final   Chloride 01/24/2023 104  98 - 111 mmol/L Final   CO2 01/24/2023 20 (L)  22 - 32 mmol/L Final   Glucose, Bld 01/24/2023 134 (H)  70 - 99 mg/dL Final   Glucose reference range applies only to samples taken after fasting for at least 8 hours.   BUN 01/24/2023 <5 (L)  6 - 20 mg/dL Final   Creatinine, Ser 01/24/2023 0.73  0.61 - 1.24 mg/dL Final   Calcium 13/02/6577 8.9  8.9 - 10.3 mg/dL Final   Total Protein 46/96/2952 7.5  6.5 - 8.1 g/dL Final   Albumin 84/13/2440 4.0  3.5 - 5.0 g/dL Final   AST 05/27/2535 47 (H)  15 - 41 U/L Final   ALT 01/24/2023 57 (H)  0 - 44 U/L Final   Alkaline Phosphatase 01/24/2023 52  38 - 126 U/L Final  Total Bilirubin 01/24/2023 0.8  0.3 - 1.2 mg/dL Final   GFR, Estimated 01/24/2023 >60  >60 mL/min Final   Comment: (NOTE) Calculated using the CKD-EPI Creatinine Equation (2021)    Anion gap 01/24/2023 11  5 - 15 Final   Performed at [facility], [address] [place], [place] [address]   WBC 01/24/2023 6.8  4.0 - 10.5 K/uL Final   RBC 01/24/2023 5.97 (H)  4.22 - 5.81 MIL/uL Final   Hemoglobin 01/24/2023 15.0  13.0 - 17.0 g/dL Final   HCT 60/45/4098 47.8  39.0 - 52.0 % Final   MCV 01/24/2023 80.1  80.0 - 100.0 fL Final   MCH 01/24/2023 25.1 (L)  26.0 - 34.0 pg Final   MCHC 01/24/2023 31.4  30.0 - 36.0 g/dL Final   RDW 11/91/4782 15.4  11.5 - 15.5 % Final   Platelets 01/24/2023 331  150 - 400 K/uL Final   nRBC 01/24/2023 0.0  0.0 - 0.2 % Final   Neutrophils Relative % 01/24/2023 48  % Final   Neutro Abs 01/24/2023 3.3  1.7 - 7.7 K/uL Final   Lymphocytes Relative 01/24/2023 35  % Final   Lymphs Abs 01/24/2023 2.3  0.7 - 4.0 K/uL Final   Monocytes Relative 01/24/2023 10  % Final   Monocytes Absolute 01/24/2023 0.7  0.1 - 1.0 K/uL Final   Eosinophils Relative 01/24/2023 6  % Final   Eosinophils Absolute 01/24/2023 0.4  0.0 - 0.5 K/uL Final   Basophils Relative 01/24/2023 1  % Final    Basophils Absolute 01/24/2023 0.0  0.0 - 0.1 K/uL Final   Immature Granulocytes 01/24/2023 0  % Final   Abs Immature Granulocytes 01/24/2023 0.02  0.00 - 0.07 K/uL Final   Performed at [facility], [address], [place], [place] [address]   Troponin I (High Sensitivity) 01/24/2023 4  <18 ng/L Final   Comment: (NOTE) Elevated high sensitivity troponin I (hsTnI) values and significant  changes across serial measurements may suggest ACS but many other  chronic and acute conditions are known to elevate hsTnI results.  Refer to the "Links" section for chest pain algorithms and additional  guidance. Performed at [facility], [address], [place], [place] [address]    Total CK 01/24/2023 610 (H)  49 - 397 U/L Final   Performed at [facility], [address], [place], [place] [address]   Magnesium 01/24/2023 2.0  1.7 - 2.4 mg/dL Final   Performed at [facility], [address], [place], [place] [address]   Troponin I (High Sensitivity) 01/24/2023 4  <18 ng/L Final   Comment: (NOTE) Elevated high sensitivity troponin I (hsTnI) values and significant  changes across serial measurements may suggest ACS but many other  chronic and acute conditions are known to elevate hsTnI results.  Refer to the "Links" section for chest pain algorithms and additional  guidance. Performed at [facility], [address] [place], [place] [address]   Admission on 01/06/2023, Discharged on 01/06/2023  Component Date Value Ref Range Status   Sodium 01/06/2023 138  135 - 145 mmol/L Final   Potassium 01/06/2023 3.2 (L)  3.5 - 5.1 mmol/L Final   Chloride 01/06/2023 105  98 - 111 mmol/L Final   CO2 01/06/2023 22  22 - 32 mmol/L Final   Glucose, Bld 01/06/2023 143 (H)  70 - 99 mg/dL Final   Glucose reference range applies only to samples taken  after fasting for at least 8 hours.   BUN 01/06/2023 <5 (L)  6 - 20 mg/dL Final   Creatinine, Ser 01/06/2023 0.68  0.61 - 1.24 mg/dL  Final   Calcium 16/05/9603 8.6 (L)  8.9 - 10.3 mg/dL Final   GFR, Estimated 01/06/2023 >60  >60 mL/min Final   Comment: (NOTE) Calculated using the CKD-EPI Creatinine Equation (2021)    Anion gap 01/06/2023 11  5 - 15 Final   Performed at [facility], [address] [place], [place] [address]   Troponin I (High Sensitivity) 01/06/2023 2  <18 ng/L Final   Comment: (NOTE) Elevated high sensitivity troponin I (hsTnI) values and significant  changes across serial measurements may suggest ACS but many other  chronic and acute conditions are known to elevate hsTnI results.  Refer to the "Links" section for chest pain algorithms and additional  guidance. Performed at [facility], [address] [place], [place] [address]    WBC 01/06/2023 7.1  4.0 - 10.5 K/uL Final   RBC 01/06/2023 5.63  4.22 - 5.81 MIL/uL Final   Hemoglobin 01/06/2023 14.4  13.0 - 17.0 g/dL Final   HCT 78/29/5621 45.0  39.0 - 52.0 % Final   MCV 01/06/2023 79.9 (L)  80.0 - 100.0 fL Final   MCH 01/06/2023 25.6 (L)  26.0 - 34.0 pg Final   MCHC 01/06/2023 32.0  30.0 - 36.0 g/dL Final   RDW 30/86/5784 15.7 (H)  11.5 - 15.5 % Final   Platelets 01/06/2023 371  150 - 400 K/uL Final   nRBC 01/06/2023 0.0  0.0 - 0.2 % Final   Neutrophils Relative % 01/06/2023 67  % Final   Neutro Abs 01/06/2023 4.6  1.7 - 7.7 K/uL Final   Lymphocytes Relative 01/06/2023 25  % Final   Lymphs Abs 01/06/2023 1.8  0.7 - 4.0 K/uL Final   Monocytes Relative 01/06/2023 7  % Final   Monocytes Absolute 01/06/2023 0.5  0.1 - 1.0 K/uL Final   Eosinophils Relative 01/06/2023 1  % Final   Eosinophils Absolute 01/06/2023 0.1  0.0 - 0.5 K/uL Final   Basophils Relative 01/06/2023 0  % Final   Basophils Absolute 01/06/2023 0.0  0.0 - 0.1 K/uL Final   Immature Granulocytes 01/06/2023 0  % Final   Abs Immature Granulocytes 01/06/2023 0.02  0.00 - 0.07 K/uL Final   Performed at [facility], [address] [place],  [place] [address]   Troponin I (High Sensitivity) 01/06/2023 2  <18 ng/L Final   Comment: (NOTE) Elevated high sensitivity troponin I (hsTnI) values and significant  changes across serial measurements may suggest ACS but many other  chronic and acute conditions are known to elevate hsTnI results.  Refer to the "Links" section for chest pain algorithms and additional  guidance. Performed at [facility], [address] [place], [place] [address]     Blood Alcohol level:  Lab Results  Component Value Date   ETH 188 (H) 01/31/2023   ETH 125 (H) 03/12/2015    Metabolic Disorder Labs: No results found for: "HGBA1C", "MPG" No results found for: "PROLACTIN" Lab Results  Component Value Date   CHOL 218 (H) 01/31/2023   TRIG 192 (H) 01/31/2023   HDL 33 (L) 01/31/2023   CHOLHDL 6.6 01/31/2023   VLDL 38 01/31/2023   LDLCALC 147 (H) 01/31/2023    Therapeutic Lab Levels: No results found for: "LITHIUM" No results found for: "  VALPROATE" No results found for: "CBMZ"  Physical Findings   AUDIT    Flowsheet Row ED from 01/31/2023 in [facility]  Alcohol Use Disorder Identification Test Final Score (AUDIT) 29      PHQ2-9    Flowsheet Row ED from 01/31/2023 in [facility] Most recent reading at 01/31/2023  3:56 AM ED from 01/31/2023 in [facility] Most recent reading at 01/31/2023  3:00 AM  PHQ-2 Total Score 2 2  PHQ-9 Total Score 9 9      Flowsheet Row ED from 01/31/2023 in [facility] Most recent reading at 01/31/2023  4:32 AM ED from 01/31/2023 in [facility] Most recent reading at 01/31/2023  3:00 AM ED from 01/24/2023 in [facility] Emergency Department at [facility] Most recent reading at 01/24/2023 10:06 AM  C-SSRS RISK CATEGORY No Risk No Risk No Risk        Musculoskeletal  Strength & Muscle Tone: Within normal limits Gait &  Station: Within normal limits Patient leans: N/A  Psychiatric Specialty Exam  Presentation  General Appearance:  Appropriate for Environment  Eye Contact: Minimal  Speech: Normal Rate  Speech Volume: Normal  Handedness: Right   Mood and Affect  Mood: Depressed  Affect: Congruent; Constricted   Thought Process  Thought Processes: Coherent; Goal Directed; Linear  Descriptions of Associations:Intact  Orientation:Full (Time, Place and Person)  Thought Content:Logical  Diagnosis of Schizophrenia or Schizoaffective disorder in past: No    Hallucinations:None   Ideas of Reference:None  Suicidal Thoughts:No  Homicidal Thoughts:No   Sensorium  Memory: Immediate Good  Judgment: Good  Insight: Good   Executive Functions  Concentration: Fair  Attention Span: Fair  Recall: Good  Fund of Knowledge: Fair  Language: Fair   Psychomotor Activity: Decreased   Assets  Assets: Desire for Improvement; Housing; Resilience; Physical Health; Social Support   Sleep  Sleep: Sleep: Fair (Slept somewhat better this past evening)   No data recorded   Physical Exam  Physical Exam Constitutional:      General: He is not in acute distress.    Appearance: He is obese. He is not diaphoretic.     Comments: Tired  HENT:     Head: Normocephalic and atraumatic.  Eyes:     Extraocular Movements: Extraocular movements intact.  Pulmonary:     Effort: Pulmonary effort is normal.  Abdominal:     General: There is distension.  Musculoskeletal:        General: Normal range of motion.  Neurological:     Mental Status: He is alert and oriented to person, place, and time. Mental status is at baseline.  Psychiatric:     Comments: nightmares    Review of Systems  Constitutional:  Positive for malaise/fatigue.  Neurological:  Positive for headaches.  Psychiatric/Behavioral:  Negative for hallucinations and suicidal ideas. The patient has insomnia.     Blood pressure 107/79, pulse 72, temperature 98.4 F (36.9 C), temperature source Oral, resp. rate 18, SpO2 100 %. There is no height or weight on file to calculate BMI.  Treatment Plan Summary: Daily contact with patient to assess and evaluate symptoms and progress in treatment  Mr. Deja is a 32 year old male with a past medical history of reported MI and palpitations and no recorded past psychiatric history who presents to facility-based crisis care for alcohol detox.  At this time, patient meets criteria for stimulant use disorder, severe,  cocaine-type and alcohol use disorder, severe.  Presentation is also highly concerning for unspecified, severe psychiatric trauma and untreated obstructive sleep apnea, which possibly contributed to daily cocaine use, cardiovascular issues, mood instability, and general malaise.   Patient appears a psychiatric baseline this morning.  Goal is still to find appropriate residential substance use treatment after discharge.  CIWA remains at 0, blood pressure and pulse had returned to normal. Alcohol withdrawal symptoms appear to have completely resolved.  Eating well and sleeping better than yesterday.  No new changes this morning.  # Alcohol use disorder, severe: ongoing.  # Stimulant use disorder, severe, cocaine type: ongoing. -Social work to explore options for residential treatment facilities after discharge, 3-5 days.  Preferred discharge: [facility].  Referrals sent. -Encourage patient to participate in therapeutic milieu while on unit. -Will consider naltrexone to target alcohol cravings .  # Recent history of trauma: Ongoing. Collateral call with wife indicates of substance use is deeply tied to recent traumatic experiences involving the death of multiple family members. -Will discuss options for outpatient psychiatric referral, and close medical follow-up.  # Tobacco use disorder: Patient vapes nicotine at home. - Received as needed nicotine  gum evening of 7/4.  # History of myocardial infarction: ongoing.  EKG 7/3 indicates left atrial enlargement, no acute concern for MI. # Concern for OSA: ongoing.  BMI 43.  Patient snores loudly on the unit. # Insomnia: Resolving. # Hypertension: Resolving. -Continue melatonin 3 mg nightly for insomnia. -Continue trazodone 50 mg as needed nightly. -Will continue to monitor blood pressure and consider addition of amlodipine versus ARB in addition to medical follow-up.  Will discuss eligibility for Medicaid before discharge.  # Disposition: Pending, likely LOS 3-5 days.  Tomie China, MD 02/02/2023 8:32 AM

## 2023-02-02 NOTE — ED Notes (Signed)
Patient denies SI/HI and AVS. Patient refused his morning medications. Patient has been resting the majority of the morning. Patient is being monitored for safety.

## 2023-02-02 NOTE — Discharge Planning (Signed)
Patient has been referred to the following facilities for long-term treatment:  [facility]: LCSW attempted to contact the [facility] [phone] and speak with Admissions Coordinator Bradly Chris extension [phone], however received no answer. Left voice message requesting phone call back.  [facility]: LCSW spoke with Admissions Coordinator Eunice Blase [phone] extension [phone]. Per Eunice Blase, patient would need to be stabilized on medications for at least 30 days before being considered for admission as he was reporting auditory and visual hallucinations about spirits on admission. Patient can follow up with agency once 30 days have been complete.   [facility]: LCSW spoke with Admissions Coordinator Lahoma Rocker [phone] regarding referral. Per Lahoma Rocker, please resend referral for review and then she will follow up with LCSW with updates.   [facility] [place]: LCSW attempted to contact the Admissions Coordinator Henrine Screws [phone] extension [phone], however received no answer. Left voice message requesting phone call back.  [facility]: Patient has been accepted to their program for Monday 07/08 by 9:00am, however patient is in need of long term placement. LCSW continuing to work diligently on securing long term placement for the patient. Follow up plan would be for patient to move forward with [facility] if long-term placement is not secured for the patient on today.    LCSW will continue to follow and provide support to patient while on [facility].   Fernande Boyden, LCSW Clinical Social Worker [facility] Ph: [phone]

## 2023-02-02 NOTE — ED Provider Notes (Signed)
FBC/OBS ASAP Discharge Summary  Date and Time: 02/02/2023 3:36 PM  Name: [name]  MRN:  [id number]   Discharge Diagnoses:  Final diagnoses:  Alcohol use disorder  Cocaine abuse (HCC)  Polysubstance abuse (HCC)    Subjective: [name] is a 32 year old male with a 13-year history of alcohol use disorder, severe who presented to [facility] with his wife on 7/3 for alcohol detox and substance use treatment follow-up.    Stay Summary: On presentation, patient's ethanol level was 188 (H).  CIWAs were consistently 0 throughout stay.  Patient treatment course was uneventful.  Patient consistently appeared drowsy, and slept poorly likely due to undiagnosed obstructive sleep apnea.  During stay patient was pleasant and participated in interviews.  He denied SI, HI and AVH throughout stay.  While he was initially amenable to long-term residential treatment at [facility], on the afternoon of 7/5 patient became uncomfortable in the present surroundings saying they "reminded him of jail" and that he wanted to leave.  Team repeatedly counseled patient against leaving, citing the likelihood of relapse in an outpatient setting, but patient was adamant that he would be fine with intensive outpatient treatment and that he had his business to attend to.  Patient was discharged and counseled on the potential sequelae of obstructive sleep apnea, highlighting the need for close PCP follow-up.  Total Time spent with patient: 1.5 hours  Past Psychiatric History: No previous psychiatric diagnosis. Never seen a psychiatrist or therapist. Not currently taking any psychiatric medications. No past trials.  Past Medical History: Reported MI "a few years ago" around the time of the initial COVID outbreak.  Family History: None reported.  Family Psychiatric History: Cousin with schizophrenia.  Social History: See previous notes. Tobacco Cessation:  A prescription for an FDA-approved tobacco cessation  medication provided at discharge  Current Medications:  Current Facility-Administered Medications  Medication Dose Route Frequency Provider Last Rate Last Admin   acetaminophen (TYLENOL) tablet 650 mg  650 mg Oral Q6H PRN Bobbitt, Shalon E, NP       alum & mag hydroxide-simeth (MAALOX/MYLANTA) 200-200-20 MG/5ML suspension 30 mL  30 mL Oral Q4H PRN Bobbitt, Shalon E, NP       dicyclomine (BENTYL) tablet 20 mg  20 mg Oral Q6H PRN Bobbitt, Shalon E, NP       hydrOXYzine (ATARAX) tablet 25 mg  25 mg Oral Q6H PRN Bobbitt, Shalon E, NP   25 mg at 01/31/23 2148   loperamide (IMODIUM) capsule 2-4 mg  2-4 mg Oral PRN Bobbitt, Shalon E, NP       LORazepam (ATIVAN) tablet 1 mg  1 mg Oral Q6H PRN Bobbitt, Shalon E, NP       magnesium hydroxide (MILK OF MAGNESIA) suspension 30 mL  30 mL Oral Daily PRN Bobbitt, Shalon E, NP       melatonin tablet 3 mg  3 mg Oral QHS Tomie China, MD   3 mg at 02/01/23 2118   methocarbamol (ROBAXIN) tablet 500 mg  500 mg Oral Q8H PRN Bobbitt, Shalon E, NP       multivitamin with minerals tablet 1 tablet  1 tablet Oral Daily Bobbitt, Shalon E, NP   1 tablet at 02/01/23 0948   naproxen (NAPROSYN) tablet 500 mg  500 mg Oral BID PRN Bobbitt, Shalon E, NP       nicotine polacrilex (NICORETTE) gum 2 mg  2 mg Oral PRN Marlou Sa, NP   2 mg at 02/01/23 2118  ondansetron (ZOFRAN-ODT) disintegrating tablet 4 mg  4 mg Oral Q6H PRN Bobbitt, Shalon E, NP       thiamine (VITAMIN B1) tablet 100 mg  100 mg Oral Daily Bobbitt, Shalon E, NP   100 mg at 02/01/23 0948   traZODone (DESYREL) tablet 50 mg  50 mg Oral QHS PRN Bobbitt, Shalon E, NP   50 mg at 02/01/23 2200   Current Outpatient Medications  Medication Sig Dispense Refill   nicotine polacrilex (NICORETTE) 2 MG gum Take 1 each (2 mg total) by mouth as needed for smoking cessation. 100 tablet 0   [START ON 02/03/2023] thiamine (VITAMIN B-1) 100 MG tablet Take 1 tablet (100 mg total) by mouth daily. 30 tablet 0    traZODone (DESYREL) 50 MG tablet Take 1 tablet (50 mg total) by mouth at bedtime as needed for sleep. 30 tablet 0    PTA Medications:  PTA Medications  Medication Sig   nicotine polacrilex (NICORETTE) 2 MG gum Take 1 each (2 mg total) by mouth as needed for smoking cessation.   traZODone (DESYREL) 50 MG tablet Take 1 tablet (50 mg total) by mouth at bedtime as needed for sleep.   [START ON 02/03/2023] thiamine (VITAMIN B-1) 100 MG tablet Take 1 tablet (100 mg total) by mouth daily.   Facility Ordered Medications  Medication   acetaminophen (TYLENOL) tablet 650 mg   alum & mag hydroxide-simeth (MAALOX/MYLANTA) 200-200-20 MG/5ML suspension 30 mL   magnesium hydroxide (MILK OF MAGNESIA) suspension 30 mL   dicyclomine (BENTYL) tablet 20 mg   hydrOXYzine (ATARAX) tablet 25 mg   methocarbamol (ROBAXIN) tablet 500 mg   naproxen (NAPROSYN) tablet 500 mg   [COMPLETED] thiamine (VITAMIN B1) injection 100 mg   thiamine (VITAMIN B1) tablet 100 mg   multivitamin with minerals tablet 1 tablet   LORazepam (ATIVAN) tablet 1 mg   loperamide (IMODIUM) capsule 2-4 mg   ondansetron (ZOFRAN-ODT) disintegrating tablet 4 mg   traZODone (DESYREL) tablet 50 mg   melatonin tablet 3 mg   nicotine polacrilex (NICORETTE) gum 2 mg       01/31/2023    3:56 AM 01/31/2023    3:00 AM  Depression screen PHQ 2/9  Decreased Interest 1 1  Down, Depressed, Hopeless 1 1  PHQ - 2 Score 2 2  Altered sleeping 1 1  Tired, decreased energy 1 1  Change in appetite 1 1  Feeling bad or failure about yourself  1 1  Trouble concentrating 1 1  Moving slowly or fidgety/restless 1 1  Suicidal thoughts 1 1  PHQ-9 Score 9 9  Difficult doing work/chores Somewhat difficult Somewhat difficult    Flowsheet Row ED from 01/31/2023 in [facility] Most recent reading at 01/31/2023  4:32 AM ED from 01/31/2023 in [facility] Most recent reading at 01/31/2023  3:00 AM ED from 01/24/2023  in [facility] Most recent reading at 01/24/2023 10:06 AM  C-SSRS RISK CATEGORY No Risk No Risk No Risk       Musculoskeletal  Strength & Muscle Tone: within normal limits Gait & Station: normal Patient leans: N/A  Psychiatric Specialty Exam  Presentation  General Appearance:  Appropriate for Environment  Eye Contact: Minimal  Speech: Normal Rate  Speech Volume: Normal  Handedness: Right   Mood and Affect  Mood: Depressed  Affect: Congruent; Constricted   Thought Process  Thought Processes: Coherent; Goal Directed; Linear  Descriptions of Associations:Intact  Orientation:Full (Time, Place and Person)  Thought Content:Logical  Diagnosis of Schizophrenia or Schizoaffective disorder in past: No    Hallucinations:Hallucinations: None  Ideas of Reference:None  Suicidal Thoughts:Suicidal Thoughts: No  Homicidal Thoughts:Homicidal Thoughts: No   Sensorium  Memory: Immediate Good  Judgment: Good  Insight: Good   Executive Functions  Concentration: Fair  Attention Span: Fair  Recall: Good  Fund of Knowledge: Fair  Language: Fair   Psychomotor Activity  Psychomotor Activity: Psychomotor Activity: Decreased   Assets  Assets: Desire for Improvement; Housing; Resilience; Physical Health; Social Support   Sleep  Sleep: Sleep: Fair (Slept somewhat better this past evening)   Physical Exam  Physical Exam Vitals reviewed.  Constitutional:      General: He is not in acute distress.    Appearance: He is obese.  HENT:     Head: Normocephalic and atraumatic.  Pulmonary:     Effort: Pulmonary effort is normal. No respiratory distress.  Abdominal:     General: Abdomen is flat.     Palpations: Abdomen is soft.  Musculoskeletal:        General: Normal range of motion.  Neurological:     Mental Status: He is alert and oriented to person, place, and time. Mental status is at baseline.   Psychiatric:        Mood and Affect: Mood normal.        Thought Content: Thought content normal.     Comments: Poor judgment and insight    Review of Systems  Cardiovascular:  Positive for orthopnea.  Psychiatric/Behavioral:  Positive for substance abuse. Negative for depression, hallucinations and suicidal ideas. The patient has insomnia.   All other systems reviewed and are negative.  Blood pressure 107/79, pulse 72, temperature 98.4 F (36.9 C), temperature source Oral, resp. rate 18, SpO2 100 %. There is no height or weight on file to calculate BMI.  Demographic Factors:  Male and Low socioeconomic status  Loss Factors: Loss of significant relationship and Decline in physical health  Historical Factors: Anniversary of important loss and Impulsivity  Risk Reduction Factors:   Sense of responsibility to family, Living with another person, especially a relative, Positive social support, and Positive therapeutic relationship  Continued Clinical Symptoms:  Alcohol/Substance Abuse/Dependencies  Cognitive Features That Contribute To Risk:  None    Suicide Risk:  Mild:  Suicidal ideation of limited frequency, intensity, duration, and specificity.  There are no identifiable plans, no associated intent, mild dysphoria and related symptoms, good self-control (both objective and subjective assessment), few other risk factors, and identifiable protective factors, including available and accessible social support.  Plan Of Care/Follow-up recommendations:  Strongly recommended that patient receive close follow-up with PCP as his obesity, history of cardiovascular problems, and likely undiagnosed severe obstructive sleep apnea pose a considerable health risk in the near future.  Activity:  Normal, as tolerated Diet:  Per PCP recommendation  Patient is instructed prior to discharge to: Take all medications as prescribed by her mental healthcare provider. Report any adverse effects  and/or reactions from the medicines to her outpatient provider promptly. Patient has been instructed & cautioned: To not engage in alcohol and or illegal drug use while on prescription medicines.  In the event of worsening symptoms, patient is instructed to call the crisis hotline at 988, 911 and or go to the nearest ED for appropriate evaluation and treatment of symptoms. To follow-up with her primary care provider for your other medical issues, concerns and or health care needs.  Disposition: Home  Tomie China, MD 02/02/2023, 3:36 PM

## 2023-02-02 NOTE — ED Notes (Addendum)
Patient was discharged home by provider. Patient denied SI/HI and AVH. Patient was not scheduled to discharge on today. Patient insisted on leaving. Patient was given an AVS and a bus pass.

## 2023-02-02 NOTE — ED Notes (Signed)
Pt was provided lunch

## 2023-02-17 ENCOUNTER — Ambulatory Visit (HOSPITAL_COMMUNITY)
Admission: EM | Admit: 2023-02-17 | Discharge: 2023-02-17 | Disposition: A | Discharge status: Home / Self Care | Payer: No Payment, Other

## 2023-02-17 DIAGNOSIS — F1024 Alcohol dependence with alcohol-induced mood disorder: Secondary | ICD-10-CM | POA: Insufficient documentation

## 2023-02-17 DIAGNOSIS — Z560 Unemployment, unspecified: Secondary | ICD-10-CM | POA: Insufficient documentation

## 2023-02-17 DIAGNOSIS — F1424 Cocaine dependence with cocaine-induced mood disorder: Secondary | ICD-10-CM | POA: Insufficient documentation

## 2023-02-17 DIAGNOSIS — F1020 Alcohol dependence, uncomplicated: Secondary | ICD-10-CM | POA: Insufficient documentation

## 2023-02-17 DIAGNOSIS — Z5986 Financial insecurity: Secondary | ICD-10-CM | POA: Insufficient documentation

## 2023-02-17 DIAGNOSIS — F1410 Cocaine abuse, uncomplicated: Secondary | ICD-10-CM | POA: Insufficient documentation

## 2023-02-17 DIAGNOSIS — I252 Old myocardial infarction: Secondary | ICD-10-CM | POA: Insufficient documentation

## 2023-02-17 DIAGNOSIS — Z79899 Other long term (current) drug therapy: Secondary | ICD-10-CM | POA: Insufficient documentation

## 2023-02-17 DIAGNOSIS — F17290 Nicotine dependence, other tobacco product, uncomplicated: Secondary | ICD-10-CM | POA: Insufficient documentation

## 2023-02-17 NOTE — Progress Notes (Signed)
   02/17/23 1742  [facility] Triage Screening (Walk-ins at [facility] only)  How Did You Hear About [place]? Family/Friend  What Is the Reason for Your Visit/Call Today? Patient was brought in by family to get patient help with ongoing alcohol issues. Patient is noted to be impaired and is declining to participate in the traige process. Patient did deny any S/I, H/I or AVH. Patient is requesting to leave and left prior to being seen by NP.  How Long Has This Been Causing You Problems?  (UTA)  Have You Recently Had Any Thoughts About Hurting Yourself? No  Are You Planning to Commit Suicide/Harm Yourself At This time? No  Have you Recently Had Thoughts About Hurting Someone Karolee Ohs? No  Are You Planning To Harm Someone At This Time? No  Explanation: NA  Are you currently experiencing any auditory, visual or other hallucinations? No  Have You Used Any Alcohol or Drugs in the Past 24 Hours? Yes  How long ago did you use Drugs or Alcohol? UTA  What Did You Use and How Much? UTA  Do you have any current medical co-morbidities that require immediate attention?  (UTA)  Clinician description of patient physical appearance/behavior: Patient is observed to be impaired  What Do You Feel Would Help You the Most Today?  (UTA)  If access to [facility]gent [facility] was not available, would you have sought care in the Emergency Department?  (UTA)  Determination of Need  (UTA)  Options For Referral  (UTA)

## 2023-02-18 ENCOUNTER — Ambulatory Visit (HOSPITAL_COMMUNITY)
Admission: EM | Admit: 2023-02-18 | Discharge: 2023-02-24 | Disposition: A | Discharge status: Home / Self Care | Payer: No Payment, Other | Source: Home / Self Care

## 2023-02-18 ENCOUNTER — Ambulatory Visit (HOSPITAL_COMMUNITY)
Admission: EM | Admit: 2023-02-18 | Discharge: 2023-02-18 | Disposition: A | Discharge status: Home / Self Care | Payer: No Payment, Other | Attending: Nurse Practitioner | Admitting: Nurse Practitioner

## 2023-02-18 DIAGNOSIS — F17290 Nicotine dependence, other tobacco product, uncomplicated: Secondary | ICD-10-CM | POA: Insufficient documentation

## 2023-02-18 DIAGNOSIS — F1424 Cocaine dependence with cocaine-induced mood disorder: Secondary | ICD-10-CM | POA: Insufficient documentation

## 2023-02-18 DIAGNOSIS — Z560 Unemployment, unspecified: Secondary | ICD-10-CM | POA: Insufficient documentation

## 2023-02-18 DIAGNOSIS — F1090 Alcohol use, unspecified, uncomplicated: Secondary | ICD-10-CM

## 2023-02-18 DIAGNOSIS — F17200 Nicotine dependence, unspecified, uncomplicated: Secondary | ICD-10-CM

## 2023-02-18 DIAGNOSIS — F1410 Cocaine abuse, uncomplicated: Secondary | ICD-10-CM

## 2023-02-18 DIAGNOSIS — F1994 Other psychoactive substance use, unspecified with psychoactive substance-induced mood disorder: Secondary | ICD-10-CM

## 2023-02-18 DIAGNOSIS — I252 Old myocardial infarction: Secondary | ICD-10-CM | POA: Insufficient documentation

## 2023-02-18 DIAGNOSIS — F1024 Alcohol dependence with alcohol-induced mood disorder: Secondary | ICD-10-CM | POA: Insufficient documentation

## 2023-02-18 DIAGNOSIS — F1590 Other stimulant use, unspecified, uncomplicated: Secondary | ICD-10-CM

## 2023-02-18 DIAGNOSIS — Z5986 Financial insecurity: Secondary | ICD-10-CM | POA: Diagnosis not present

## 2023-02-18 LAB — ETHANOL: Alcohol, Ethyl (B): 180 mg/dL — ABNORMAL HIGH (ref <10)

## 2023-02-18 LAB — COMPREHENSIVE METABOLIC PANEL
ALT: 77 U/L — ABNORMAL HIGH (ref 0–44)
AST: 86 U/L — ABNORMAL HIGH (ref 15–41)
Albumin: 3.8 g/dL (ref 3.5–5.0)
Alkaline Phosphatase: 54 U/L (ref 38–126)
Anion gap: 12 (ref 5–15)
BUN: <5 mg/dL — ABNORMAL LOW (ref 6–20)
CO2: 20 mmol/L — ABNORMAL LOW (ref 22–32)
Calcium: 9.1 mg/dL (ref 8.9–10.3)
Chloride: 105 mmol/L (ref 98–111)
Creatinine, Ser: 0.8 mg/dL (ref 0.61–1.24)
GFR, Estimated: >60 mL/min (ref >60)
Glucose, Bld: 168 mg/dL — ABNORMAL HIGH (ref 70–99)
Potassium: 4.3 mmol/L (ref 3.5–5.1)
Sodium: 137 mmol/L (ref 135–145)
Total Bilirubin: 1.1 mg/dL (ref 0.3–1.2)
Total Protein: 7 g/dL (ref 6.5–8.1)

## 2023-02-18 LAB — POCT URINE DRUG SCREEN - MANUAL ENTRY (I-SCREEN)
POC Amphetamine UR: NOT DETECTED
POC Buprenorphine (BUP): NOT DETECTED
POC Cocaine UR: POSITIVE — AB
POC Marijuana UR: NOT DETECTED
POC Methadone UR: NOT DETECTED
POC Methamphetamine UR: NOT DETECTED
POC Morphine: NOT DETECTED
POC Oxazepam (BZO): POSITIVE — AB
POC Oxycodone UR: NOT DETECTED
POC Secobarbital (BAR): NOT DETECTED

## 2023-02-18 LAB — CBC WITH DIFFERENTIAL/PLATELET
Abs Immature Granulocytes: 0.03 10*3/uL (ref 0.00–0.07)
Basophils Absolute: 0.1 10*3/uL (ref 0.0–0.1)
Basophils Relative: 1 %
Eosinophils Absolute: 0.6 10*3/uL — ABNORMAL HIGH (ref 0.0–0.5)
Eosinophils Relative: 6 %
HCT: 47.3 % (ref 39.0–52.0)
Hemoglobin: 14.9 g/dL (ref 13.0–17.0)
Immature Granulocytes: 0 %
Lymphocytes Relative: 48 %
Lymphs Abs: 4.5 10*3/uL — ABNORMAL HIGH (ref 0.7–4.0)
MCH: 25.7 pg — ABNORMAL LOW (ref 26.0–34.0)
MCHC: 31.5 g/dL (ref 30.0–36.0)
MCV: 81.6 fL (ref 80.0–100.0)
Monocytes Absolute: 0.6 10*3/uL (ref 0.1–1.0)
Monocytes Relative: 6 %
Neutro Abs: 3.6 10*3/uL (ref 1.7–7.7)
Neutrophils Relative %: 39 %
Platelets: 342 10*3/uL (ref 150–400)
RBC: 5.8 MIL/uL (ref 4.22–5.81)
RDW: 17.6 % — ABNORMAL HIGH (ref 11.5–15.5)
WBC: 9.3 10*3/uL (ref 4.0–10.5)
nRBC: 0 % (ref 0.0–0.2)

## 2023-02-18 LAB — LIPID PANEL
Cholesterol: 224 mg/dL — ABNORMAL HIGH (ref 0–200)
HDL: 37 mg/dL — ABNORMAL LOW (ref >40)
LDL Cholesterol: 124 mg/dL — ABNORMAL HIGH (ref 0–99)
Total CHOL/HDL Ratio: 6.1 RATIO
Triglycerides: 315 mg/dL — ABNORMAL HIGH (ref <150)
VLDL: 63 mg/dL — ABNORMAL HIGH (ref 0–40)

## 2023-02-18 MED ORDER — LORAZEPAM 1 MG PO TABS: 1.0000 mg | ORAL_TABLET | Freq: Four times a day (QID) | ORAL | Status: DC | PRN

## 2023-02-18 MED ORDER — THIAMINE MONONITRATE 100 MG PO TABS
100.0000 mg | ORAL_TABLET | Freq: Every day | ORAL | Status: DC
  Administered 2023-02-19 – 2023-02-23 (×5): 100 mg via ORAL
  Filled 2023-02-18 (×6): qty 1

## 2023-02-18 MED ORDER — LOPERAMIDE HCL 2 MG PO CAPS: 2.0000 mg | ORAL_CAPSULE | ORAL | Status: DC | PRN

## 2023-02-18 MED ORDER — LORAZEPAM 1 MG PO TABS
1.0000 mg | ORAL_TABLET | Freq: Every day | ORAL | Status: AC
  Filled 2023-02-18: qty 1

## 2023-02-18 MED ORDER — HYDROXYZINE HCL 25 MG PO TABS: 25.0000 mg | ORAL_TABLET | Freq: Four times a day (QID) | ORAL | Status: DC | PRN

## 2023-02-18 MED ORDER — ALUM & MAG HYDROXIDE-SIMETH 200-200-20 MG/5ML PO SUSP: 30.0000 mL | ORAL | Status: DC | PRN

## 2023-02-18 MED ORDER — LORAZEPAM 1 MG PO TABS: 1.0000 mg | ORAL_TABLET | Freq: Two times a day (BID) | ORAL | Status: DC

## 2023-02-18 MED ORDER — LORAZEPAM 1 MG PO TABS
1.0000 mg | ORAL_TABLET | Freq: Two times a day (BID) | ORAL | Status: AC
  Administered 2023-02-20 – 2023-02-21 (×2): 1 mg via ORAL
  Filled 2023-02-18 (×2): qty 1

## 2023-02-18 MED ORDER — LORAZEPAM 1 MG PO TABS: 1.0000 mg | ORAL_TABLET | Freq: Every day | ORAL | Status: DC

## 2023-02-18 MED ORDER — THIAMINE HCL 100 MG/ML IJ SOLN
100.0000 mg | Freq: Once | INTRAMUSCULAR | Status: AC
  Administered 2023-02-18: 100 mg via INTRAMUSCULAR
  Filled 2023-02-18: qty 2

## 2023-02-18 MED ORDER — THIAMINE MONONITRATE 100 MG PO TABS: 100.0000 mg | ORAL_TABLET | Freq: Every day | ORAL | Status: DC

## 2023-02-18 MED ORDER — MAGNESIUM HYDROXIDE 400 MG/5ML PO SUSP: 30.0000 mL | Freq: Every day | ORAL | Status: DC | PRN

## 2023-02-18 MED ORDER — NICOTINE 14 MG/24HR TD PT24: 14.0000 mg | MEDICATED_PATCH | Freq: Every day | TRANSDERMAL | Status: DC

## 2023-02-18 MED ORDER — ONDANSETRON 4 MG PO TBDP: 4.0000 mg | ORAL_TABLET | Freq: Four times a day (QID) | ORAL | Status: DC | PRN

## 2023-02-18 MED ORDER — LORAZEPAM 1 MG PO TABS: 1.0000 mg | ORAL_TABLET | Freq: Three times a day (TID) | ORAL | Status: DC

## 2023-02-18 MED ORDER — LORAZEPAM 1 MG PO TABS
1.0000 mg | ORAL_TABLET | Freq: Three times a day (TID) | ORAL | Status: AC
  Administered 2023-02-19 – 2023-02-20 (×3): 1 mg via ORAL
  Filled 2023-02-18 (×3): qty 1

## 2023-02-18 MED ORDER — TRAZODONE HCL 50 MG PO TABS
50.0000 mg | ORAL_TABLET | Freq: Every evening | ORAL | Status: DC | PRN
  Administered 2023-02-18: 50 mg via ORAL
  Filled 2023-02-18: qty 1

## 2023-02-18 MED ORDER — ADULT MULTIVITAMIN W/MINERALS CH
1.0000 | ORAL_TABLET | Freq: Every day | ORAL | Status: DC
  Administered 2023-02-18 – 2023-02-23 (×6): 1 via ORAL
  Filled 2023-02-18 (×7): qty 1

## 2023-02-18 MED ORDER — LORAZEPAM 1 MG PO TABS
1.0000 mg | ORAL_TABLET | Freq: Four times a day (QID) | ORAL | Status: DC
  Administered 2023-02-18: 1 mg via ORAL
  Filled 2023-02-18: qty 1

## 2023-02-18 MED ORDER — ACETAMINOPHEN 325 MG PO TABS: 650.0000 mg | ORAL_TABLET | Freq: Four times a day (QID) | ORAL | Status: DC | PRN

## 2023-02-18 MED ORDER — ADULT MULTIVITAMIN W/MINERALS CH
1.0000 | ORAL_TABLET | Freq: Every day | ORAL | Status: DC
  Administered 2023-02-18: 1 via ORAL
  Filled 2023-02-18: qty 1

## 2023-02-18 MED ORDER — LORAZEPAM 1 MG PO TABS
1.0000 mg | ORAL_TABLET | Freq: Four times a day (QID) | ORAL | Status: AC
  Administered 2023-02-18 – 2023-02-19 (×4): 1 mg via ORAL
  Filled 2023-02-18 (×4): qty 1

## 2023-02-18 NOTE — Progress Notes (Signed)
   02/17/23 2340  Patient Reported Information  How Did You Hear About [place]? Self  What Is the Reason for Your Visit/Call Today? Pt presented to [facility] earlier but left AMA today (02/17/2023) because he had a lot going on. Pt reports, he came back because he wanted help with drugs. Pt denies, SI, HI, AVH, self-injurious behaviors and access to weapons.  How Long Has This Been Causing You Problems? > than 6 months  What Do You Feel Would Help You the Most Today? Alcohol or Drug Use Treatment  Have You Recently Had Any Thoughts About Hurting Yourself? No  Are You Planning to Commit Suicide/Harm Yourself At This time? No  Have you Recently Had Thoughts About Hurting Someone Karolee Ohs? No  Are You Planning To Harm Someone At This Time? No  Explanation: Pt denies, HI.  Have You Used Any Alcohol or Drugs in the Past 24 Hours? Yes  What Did You Use and How Much? Pt reports, he had a beer.  Do You Currently Have a Therapist/Psychiatrist? No  Name of Therapist/Psychiatrist Pt denies, being linked to outpatient resources.  Have You Been Recently Discharged From Any Office Practice or Programs? Yes  Explanation of Discharge From Practice/Program Pt left AMA from [facility] at [facility] on 02/02/2023 after two days from admission.  CCA Screening Triage Referral Assessment  Type of Contact Face-to-Face  Location of Assessment [facility] Assessment Services  Provider location [facility] Assessment Services  Collateral Involvement Pt declined for clinician to contact anyone to obtain additional information.  Does Patient Have a Automotive engineer Guardian? No  Legal Guardian Contact Information Pt is his own guardian.  Copy of Legal Guardianship Form in Chart  (Pt is his own guardian.)  Legal Guardian Notified of Arrival   (Pt is his own guardian.)  Legal Guardian Notified of Pending Discharge   (Pt is his own guardian.)  If Minor and Not Living with Parent(s), Who has Custody? Pt is an adult and  his own guardian.  Is CPS involved or ever been involved? Never  Is APS involved or ever been involved? Never  Patient Determined To Be At Risk for Harm To Self or Others Based on Review of Patient Reported Information or Presenting Complaint? No  Method No Plan (Pt denies, SI/HI.)  Availability of Means No access or NA (Pt denies, SI/HI.)  Intent Vague intent or NA (Pt denies, SI/HI.)  Notification Required No need or identified person (Pt denies, SI/HI.)  Additional Information for Danger to Others Potential  (Pt denies, SI/HI.)  Additional Comments for Danger to Others Potential Pt denies, SI/HI.  Are There Guns or Other Weapons in Your Home? No  Types of Guns/Weapons Pt denies, access to weapons.  Are These Weapons Safely Secured?  (Pt denies, access to weapons.)  Who Could Verify You Are Able To Have These Secured: Pt denies, access to weapons.  Do You Have any Outstanding Charges, Pending Court Dates, Parole/Probation? Pt denies, legal involvement.  Contacted To Inform of Risk of Harm To Self or Others: Other: Comment (Pt denies, SI/HI.)  Does Patient Present under Involuntary Commitment? No  [place] of Residence [place]  Patient Currently Receiving the Following Services: Not Receiving Services  Determination of Need Urgent (48 hours)  Options For Referral Facility-Based Crisis;BH Urgent Care;Intensive Outpatient Therapy    Determination of need: Urgent.   Redmond Pulling, MS, [facility], [facility] Triage Specialist [phone]

## 2023-02-18 NOTE — ED Notes (Signed)
Patient had shower and in his room lying on his bed trying to get some sleep. Respirations equal and unlabored, skin warm and dry. No change in assessment or acuity. Routine safety checks conducted according to facility protocol. Will continue to monitor for safety.

## 2023-02-18 NOTE — Group Note (Signed)
Group Topic: Positive Affirmations  Group Date: 02/18/2023 Start Time: 1700 End Time: 1717 Facilitators: Vonzell Schlatter B  Department: [facility]  Number of Participants: 3  Group Focus: daily focus Treatment Modality:  Psychoeducation Interventions utilized were reality testing Purpose: increase insight   Name: [name] Date of Birth: [birth year]  MR: [id number]    Level of Participation: Did not attend  Quality of Participation: n/a Interactions with others: n/a Mood/Affect: n/a Triggers (if applicable): n/a Cognition: n/a Progress: None Response: n/a Plan: follow-up needed  Patients Problems:  Patient Active Problem List   Diagnosis Date Noted   Stimulant use disorder 02/18/2023   Substance induced mood disorder (HCC) 02/18/2023   Nicotine use disorder 02/18/2023   Alcohol use disorder 01/31/2023

## 2023-02-18 NOTE — Group Note (Signed)
Group Topic: Communication  Group Date: 02/18/2023 Start Time: 2100 End Time: 2130 Facilitators: Rae Lips B  Department: [facility]  Number of Participants: 3  Group Focus: activities of daily living skills Treatment Modality:  Individual Therapy Interventions utilized were leisure development Purpose: express feelings  Name: [name] Date of Birth: 01/14/1991  MR: [id number]    Level of Participation: active Quality of Participation: attentive Interactions with others: gave feedback Mood/Affect: appropriate Triggers (if applicable): NA Cognition: coherent/clear Progress: Gaining insight Response: NA Plan: patient will be encouraged to keep going to groups.   Patients Problems:  Patient Active Problem List   Diagnosis Date Noted   Stimulant use disorder 02/18/2023   Substance induced mood disorder (HCC) 02/18/2023   Nicotine use disorder 02/18/2023   Alcohol use disorder 01/31/2023

## 2023-02-18 NOTE — ED Notes (Signed)
Patient was brought to facility by family who were concerned about his cocaine and etoh use.  Patients family left after they dropped him off.  Patient then stated he did not want to be seen by provider and asked to leave which he was granted.  Patient then went out to call family to pick him up and was told he could not return until he gets treatment.  Patient then came back into the building and pleaded to be readmitted which he was.  Patient brought onto [facility].  He is malodorous, with dirty clothing and hands.  Patient encouraged to attend to ADL's and given supplies to do so.  As of yet he still has not showered.  Patient does not exhibit etoh withdrawal at this time.  CIWA 0.  He was a patient here approximately 2 weeks ago and left shortly after admission as he felt he was "in prison."  Insight is poor and motivation appears to be driven by family pressure.  Will monitor.

## 2023-02-18 NOTE — ED Notes (Signed)
Patient resting with no sxs of distress noted - will continue to monitor for safety 

## 2023-02-18 NOTE — ED Notes (Signed)
Remains asleep in bed.  No distress.  No withdrawal.  Will monitor.

## 2023-02-18 NOTE — ED Provider Notes (Signed)
[facility]/OBS ASAP Discharge Summary  Date and Time: 02/18/2023 9:30 AM  Name: [name]  MRN:  [id number]   Discharge Diagnoses:  Final diagnoses:  Alcohol use disorder  Cocaine abuse (HCC)    Subjective: Patient was seen and evaluated face-to-face by this provider.  Denying suicidal or homicidal ideations.  Denies auditoryor visual hallucinations.  Reports his family encouraged him to follow-up for substance abuse/detox treatment.  Deddrick stated " I was not ready to stop using."  He reported automobile issues and financial concerns. he reports he has discharge outpatient resources available from previous admissions.  Denying any withdrawal symptoms at this time to include headache nausea, vomiting, dizziness or diarrhea.  Patient was discharged to the lobby.  Patient requested to be reevaluated by this provider.  upon discharge and after speaking to his family patient stated that " I now feel ready to change."  Patient has requested to be admitted back to [facility].   Stay Summary: During evaluation CEASER EBELING is sitting; he is alert/oriented x 4; calm/cooperative; and mood congruent with affect.  Patient is speaking in a clear tone at moderate volume, and normal pace; with good eye contact. His thought process is coherent and relevant; There is no indication that he is currently responding to internal/external stimuli or experiencing delusional thought content.  Patient denies suicidal/self-harm/homicidal ideation, psychosis, and paranoia.  Patient has remained calm throughout assessment and has answered questions appropriately.   Total Time spent with patient: 15 minutes  Past Psychiatric History:  Past Medical History:  Family History:  Family Psychiatric History:  Social History:  Tobacco Cessation:  N/A, patient does not currently use tobacco products  Current Medications:  Current Facility-Administered Medications  Medication Dose Route Frequency Provider  Last Rate Last Admin   acetaminophen (TYLENOL) tablet 650 mg  650 mg Oral Q6H PRN Bobbitt, Shalon E, NP       alum & mag hydroxide-simeth (MAALOX/MYLANTA) 200-200-20 MG/5ML suspension 30 mL  30 mL Oral Q4H PRN Bobbitt, Shalon E, NP       hydrOXYzine (ATARAX) tablet 25 mg  25 mg Oral Q6H PRN Bobbitt, Shalon E, NP       loperamide (IMODIUM) capsule 2-4 mg  2-4 mg Oral PRN Bobbitt, Shalon E, NP       LORazepam (ATIVAN) tablet 1 mg  1 mg Oral Q6H PRN Bobbitt, Shalon E, NP       LORazepam (ATIVAN) tablet 1 mg  1 mg Oral QID Bobbitt, Shalon E, NP       Followed by   Melene Muller ON 02/19/2023] LORazepam (ATIVAN) tablet 1 mg  1 mg Oral TID Bobbitt, Shalon E, NP       Followed by   Melene Muller ON 02/20/2023] LORazepam (ATIVAN) tablet 1 mg  1 mg Oral BID Bobbitt, Shalon E, NP       Followed by   Melene Muller ON 02/22/2023] LORazepam (ATIVAN) tablet 1 mg  1 mg Oral Daily Bobbitt, Shalon E, NP       magnesium hydroxide (MILK OF MAGNESIA) suspension 30 mL  30 mL Oral Daily PRN Bobbitt, Shalon E, NP       multivitamin with minerals tablet 1 tablet  1 tablet Oral Daily Bobbitt, Shalon E, NP       ondansetron (ZOFRAN-ODT) disintegrating tablet 4 mg  4 mg Oral Q6H PRN Bobbitt, Shalon E, NP       [START ON 02/19/2023] thiamine (VITAMIN B1) tablet 100 mg  100 mg Oral Daily Bobbitt, Shalon  E, NP       traZODone (DESYREL) tablet 50 mg  50 mg Oral QHS PRN Bobbitt, Shalon E, NP   50 mg at 02/18/23 0204   Current Outpatient Medications  Medication Sig Dispense Refill   nicotine polacrilex (NICORETTE) 2 MG gum Take 1 each (2 mg total) by mouth as needed for smoking cessation. 100 tablet 0   thiamine (VITAMIN B-1) 100 MG tablet Take 1 tablet (100 mg total) by mouth daily. 30 tablet 0   traZODone (DESYREL) 50 MG tablet Take 1 tablet (50 mg total) by mouth at bedtime as needed for sleep. 30 tablet 0    PTA Medications:  Facility Ordered Medications  Medication   acetaminophen (TYLENOL) tablet 650 mg   alum & mag hydroxide-simeth  (MAALOX/MYLANTA) 200-200-20 MG/5ML suspension 30 mL   magnesium hydroxide (MILK OF MAGNESIA) suspension 30 mL   traZODone (DESYREL) tablet 50 mg   [COMPLETED] thiamine (VITAMIN B1) injection 100 mg   [START ON 02/19/2023] thiamine (VITAMIN B1) tablet 100 mg   multivitamin with minerals tablet 1 tablet   LORazepam (ATIVAN) tablet 1 mg   hydrOXYzine (ATARAX) tablet 25 mg   loperamide (IMODIUM) capsule 2-4 mg   ondansetron (ZOFRAN-ODT) disintegrating tablet 4 mg   LORazepam (ATIVAN) tablet 1 mg   Followed by   Melene Muller ON 02/19/2023] LORazepam (ATIVAN) tablet 1 mg   Followed by   Melene Muller ON 02/20/2023] LORazepam (ATIVAN) tablet 1 mg   Followed by   Melene Muller ON 02/22/2023] LORazepam (ATIVAN) tablet 1 mg   PTA Medications  Medication Sig   nicotine polacrilex (NICORETTE) 2 MG gum Take 1 each (2 mg total) by mouth as needed for smoking cessation.   traZODone (DESYREL) 50 MG tablet Take 1 tablet (50 mg total) by mouth at bedtime as needed for sleep.   thiamine (VITAMIN B-1) 100 MG tablet Take 1 tablet (100 mg total) by mouth daily.       01/31/2023    3:56 AM 01/31/2023    3:00 AM  Depression screen PHQ 2/9  Decreased Interest 1 1  Down, Depressed, Hopeless 1 1  PHQ - 2 Score 2 2  Altered sleeping 1 1  Tired, decreased energy 1 1  Change in appetite 1 1  Feeling bad or failure about yourself  1 1  Trouble concentrating 1 1  Moving slowly or fidgety/restless 1 1  Suicidal thoughts 1 1  PHQ-9 Score 9 9  Difficult doing work/chores Somewhat difficult Somewhat difficult    Flowsheet Row ED from 02/18/2023 in [facility] ED from 02/17/2023 in [facility] ED from [facility]/31/2023 in [facility]  C-SSRS RISK CATEGORY No Risk No Risk No Risk       Musculoskeletal  Strength & Muscle Tone: within normal limits Gait & Station: normal Patient leans: N/A  Psychiatric Specialty Exam  Presentation  General Appearance:   Disheveled  Eye Contact: Minimal  Speech: Slurred  Speech Volume: Decreased  Handedness: Right   Mood and Affect  Mood: Depressed  Affect: Congruent   Thought Process  Thought Processes: Coherent  Descriptions of Associations:Intact  Orientation:Full (Time, Place and Person)  Thought Content:WDL  Diagnosis of Schizophrenia or Schizoaffective disorder in past: No    Hallucinations:Hallucinations: None Description of Auditory Hallucinations: denies Description of Visual Hallucinations: denies  Ideas of Reference:None  Suicidal Thoughts:Suicidal Thoughts: No  Homicidal Thoughts:Homicidal Thoughts: No   Sensorium  Memory: Immediate Good; Recent Good; Remote Good  Judgment:  Poor  Insight: Poor   Art therapist  Concentration: Fair  Attention Span: Fair  Recall: [employer] of Knowledge: Fair  Language: Good   Psychomotor Activity  Psychomotor Activity: Psychomotor Activity: Normal   Assets  Assets: Desire for Improvement; Resilience; Physical Health   Sleep  Sleep: Sleep: Poor Number of Hours of Sleep: 3   Nutritional Assessment (For OBS and FBC admissions only) Has the patient had a weight loss or gain of 10 pounds or more in the last 3 months?: No Has the patient had a decrease in food intake/or appetite?: No Does the patient have dental problems?: No Does the patient have eating habits or behaviors that may be indicators of an eating disorder including binging or inducing vomiting?: No Has the patient recently lost weight without trying?: 0 Has the patient been eating poorly because of a decreased appetite?: 0 Malnutrition Screening Tool Score: 0    Physical Exam  Physical Exam Vitals and nursing note reviewed.  Neurological:     Mental Status: He is alert and oriented to person, place, and time.  Psychiatric:        Mood and Affect: Mood normal.        Behavior: Behavior normal.    Review of Systems   Psychiatric/Behavioral:  Positive for substance abuse. Negative for depression and suicidal ideas. The patient is nervous/anxious.   All other systems reviewed and are negative.  Blood pressure 108/60, pulse 74, temperature 97.8 F (36.6 C), temperature source Oral, resp. rate 20, SpO2 99%. There is no height or weight on file to calculate BMI.  Demographic Factors:  Male  Loss Factors: Financial problems/change in socioeconomic status  Historical Factors: Family history of mental illness or substance abuse  Risk Reduction Factors:   Living with another person, especially a relative  Continued Clinical Symptoms:  Alcohol/Substance Abuse/Dependencies  Cognitive Features That Contribute To Risk:  Closed-mindedness    Suicide Risk:  Minimal: No identifiable suicidal ideation.  Patients presenting with no risk factors but with morbid ruminations; may be classified as minimal risk based on the severity of the depressive symptoms  Plan Of Care/Follow-up recommendations:  Activity:  As tolerated Diet:  Heart healthy  Disposition: Patient to be transferred to Facility Based Crisis [facility])   Oneta Rack, NP 02/18/2023, 9:30 AM

## 2023-02-18 NOTE — ED Notes (Signed)
Patient lying in bed quietly, no distress noted will continue to monitor patient for safety.

## 2023-02-18 NOTE — ED Notes (Signed)
Pt is in his room resting in bed. Pt denies SI/HI/AVH. No acute distress noted. Will continue to monitor for safety.

## 2023-02-18 NOTE — Discharge Instructions (Signed)

## 2023-02-18 NOTE — ED Provider Notes (Signed)
[facility] Continuous Assessment Admission H&P  Date: 02/18/23 Patient Name: [name] MRN: [id number] Chief Complaint: alcohol detox  Diagnoses:  Final diagnoses:  Alcohol use disorder  Cocaine abuse [facility])    HPI: [name] is a 32 year old male presenting voluntarily to [facility] to detox from alcohol.  Patient reports that he drinks daily to 2- 3  40 ounce beers of alcohol and uses cocaine.  Nurse practitioner assessed patient face-to-face and reviewed his chart.  Patient is alert oriented x 4, speech is slurred, thought process is logical and goal-directed, mood is depressed and anxious with congruent affect.  Patient denies any SI/HI/AVH and does not appear to responding to any internal or external stimuli  Patient reports that he wants to get help to stop drinking and using cocaine.  Patient reports difficulty with sleep, poor appetite, depression an anxiety.  Patient was brought in earlier in the day to [facility] by his family to get help with his alcohol abuse.  According to triage note patient appeared to be impaired and refused treatment.  Patient states now that he wants to get help and that earlier in the day they were too many people around asking him too many questions and that why he left.  Patient reports that he has experienced withdrawal symptoms of tremors, sweating nausea and vomiting, diarrhea, headaches and difficulty eating in the past but is not currently experiencing any withdrawal symptoms.. Patient is willing to be admitted for inpatient treatment.  Patient will be admitted to [facility] for crisis management, stabilization and to safety.    Total Time spent with patient: 20 minutes  Musculoskeletal  Strength & Muscle Tone: within normal limits Gait & Station: normal Patient leans: Right  Psychiatric Specialty Exam  Presentation General Appearance:  Disheveled  Eye Contact: Minimal  Speech: Slurred  Speech  Volume: Decreased  Handedness: Right   Mood and Affect  Mood: Depressed  Affect: Congruent   Thought Process  Thought Processes: Coherent  Descriptions of Associations:Intact  Orientation:Full (Time, Place and Person)  Thought Content:WDL  Diagnosis of Schizophrenia or Schizoaffective disorder in past: No   Hallucinations:Hallucinations: None Description of Auditory Hallucinations: denies Description of Visual Hallucinations: denies  Ideas of Reference:None  Suicidal Thoughts:Suicidal Thoughts: No  Homicidal Thoughts:Homicidal Thoughts: No   Sensorium  Memory: Immediate Good; Recent Good; Remote Good  Judgment: Poor  Insight: Poor   Executive Functions  Concentration: Fair  Attention Span: Fair  Recall: Fair  Fund of Knowledge: Fair  Language: Good   Psychomotor Activity  Psychomotor Activity: Psychomotor Activity: Normal   Assets  Assets: Desire for Improvement; Resilience; Physical Health   Sleep  Sleep: Sleep: Poor Number of Hours of Sleep: 3   Nutritional Assessment (For OBS and FBC admissions only) Has the patient had a weight loss or gain of 10 pounds or more in the last 3 months?: No Has the patient had a decrease in food intake/or appetite?: No Does the patient have dental problems?: No Does the patient have eating habits or behaviors that may be indicators of an eating disorder including binging or inducing vomiting?: No Has the patient recently lost weight without trying?: 0 Has the patient been eating poorly because of a decreased appetite?: 0 Malnutrition Screening Tool Score: 0    Physical Exam HENT:     Head: Normocephalic and atraumatic.     Nose: Nose normal.  Eyes:     Pupils: Pupils are equal, round, and reactive to light.  Cardiovascular:  Rate and Rhythm: Normal rate.  Pulmonary:     Effort: Pulmonary effort is normal.  Abdominal:     General: Abdomen is flat.  Musculoskeletal:         General: Normal range of motion.     Cervical back: Normal range of motion.  Skin:    General: Skin is warm.  Neurological:     Mental Status: He is alert and oriented to person, place, and time.  Psychiatric:        Attention and Perception: Attention normal.        Mood and Affect: Mood is anxious and depressed.        Behavior: Behavior is cooperative.        Thought Content: Thought content normal.        Cognition and Memory: Cognition normal.        Judgment: Judgment is impulsive.    Review of Systems  Constitutional: Negative.   HENT: Negative.    Eyes: Negative.   Respiratory: Negative.    Cardiovascular: Negative.   Gastrointestinal: Negative.   Genitourinary: Negative.   Musculoskeletal: Negative.   Skin: Negative.   Neurological: Negative.   Endo/Heme/Allergies: Negative.   Psychiatric/Behavioral:  Positive for depression. The patient is nervous/anxious.     Blood pressure 108/60, pulse 74, temperature 97.8 F (36.6 C), temperature source Oral, resp. rate 20, SpO2 99%. There is no height or weight on file to calculate BMI.  Past Psychiatric History: Alcohol abuse, cocaine use disorder, polysubstance abuse   Is the patient at risk to self? No  Has the patient been a risk to self in the past 6 months? No .    Has the patient been a risk to self within the distant past? No   Is the patient a risk to others? No   Has the patient been a risk to others in the past 6 months? No   Has the patient been a risk to others within the distant past? 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   Last Labs:  Admission on 02/18/2023  Component Date Value Ref Range Status   WBC 02/18/2023 9.3  4.0 - 10.5 K/uL Final   RBC 02/18/2023 5.80  4.22 - 5.81 MIL/uL Final   Hemoglobin 02/18/2023 14.9  13.0 - 17.0 g/dL Final   HCT 16/05/9603 47.3  39.0 - 52.0 % Final   MCV  02/18/2023 81.6  80.0 - 100.0 fL Final   MCH 02/18/2023 25.7 (L)  26.0 - 34.0 pg Final   MCHC 02/18/2023 31.5  30.0 - 36.0 g/dL Final   RDW 54/03/8118 17.6 (H)  11.5 - 15.5 % Final   Platelets 02/18/2023 342  150 - 400 K/uL Final   nRBC 02/18/2023 0.0  0.0 - 0.2 % Final   Neutrophils Relative % 02/18/2023 39  % Final   Neutro Abs 02/18/2023 3.6  1.7 - 7.7 K/uL Final   Lymphocytes Relative 02/18/2023 48  % Final   Lymphs Abs 02/18/2023 4.5 (H)  0.7 - 4.0 K/uL Final   Monocytes Relative 02/18/2023 6  % Final   Monocytes Absolute 02/18/2023 0.6  0.1 - 1.0 K/uL Final   Eosinophils Relative 02/18/2023 6  % Final   Eosinophils Absolute 02/18/2023 0.6 (H)  0.0 - 0.5 K/uL Final   Basophils Relative 02/18/2023 1  % Final   Basophils Absolute 02/18/2023 0.1  0.0 -  0.1 K/uL Final   Immature Granulocytes 02/18/2023 0  % Final   Abs Immature Granulocytes 02/18/2023 0.03  0.00 - 0.07 K/uL Final   Performed at [facility], [address] [place], [place] [address]   Sodium 02/18/2023 137  135 - 145 mmol/L Final   Potassium 02/18/2023 4.3  3.5 - 5.1 mmol/L Final   Chloride 02/18/2023 105  98 - 111 mmol/L Final   CO2 02/18/2023 20 (L)  22 - 32 mmol/L Final   Glucose, Bld 02/18/2023 168 (H)  70 - 99 mg/dL Final   Glucose reference range applies only to samples taken after fasting for at least 8 hours.   BUN 02/18/2023 <5 (L)  6 - 20 mg/dL Final   Creatinine, Ser 02/18/2023 0.80  0.61 - 1.24 mg/dL Final   Calcium 02/54/2706 9.1  8.9 - 10.3 mg/dL Final   Total Protein 23/76/2831 7.0  6.5 - 8.1 g/dL Final   Albumin 51/76/1607 3.8  3.5 - 5.0 g/dL Final   AST 37/04/6268 86 (H)  15 - 41 U/L Final   ALT 02/18/2023 77 (H)  0 - 44 U/L Final   Alkaline Phosphatase 02/18/2023 54  38 - 126 U/L Final   Total Bilirubin 02/18/2023 1.1  0.3 - 1.2 mg/dL Final   GFR, Estimated 02/18/2023 >60  >60 mL/min Final   Comment: (NOTE) Calculated using the CKD-EPI Creatinine Equation (2021)    Anion gap 02/18/2023  12  5 - 15 Final   Performed at [facility], [address], [place], [place] [address]   Cholesterol 02/18/2023 224 (H)  0 - 200 mg/dL Final   Triglycerides 27/09/5007 315 (H)  <150 mg/dL Final   HDL 38/18/2993 37 (L)  >40 mg/dL Final   Total CHOL/HDL Ratio 02/18/2023 6.1  RATIO Final   VLDL 02/18/2023 63 (H)  0 - 40 mg/dL Final   LDL Cholesterol 02/18/2023 124 (H)  0 - 99 mg/dL Final   Comment:        Total Cholesterol/HDL:CHD Risk Coronary Heart Disease Risk Table                     Men   Women  1/2 Average Risk   3.4   3.3  Average Risk       5.0   4.4  2 X Average Risk   9.6   7.1  3 X Average Risk  23.4   11.0        Use the calculated Patient Ratio above and the CHD Risk Table to determine the patient's CHD Risk.        ATP III CLASSIFICATION (LDL):  <100     mg/dL   Optimal  716-967  mg/dL   Near or Above                    Optimal  130-159  mg/dL   Borderline  893-810  mg/dL   High  >175     mg/dL   Very High Performed at [facility], [address], [place], [place] [address]    POC Amphetamine UR 02/18/2023 None Detected  NONE DETECTED (Cut Off Level 1000 ng/mL) Final   POC Secobarbital (BAR) 02/18/2023 None Detected  NONE DETECTED (Cut Off Level 300 ng/mL) Final   POC Buprenorphine (BUP) 02/18/2023 None Detected  NONE DETECTED (Cut Off Level 10 ng/mL) Final   POC Oxazepam (BZO) 02/18/2023 Positive (A)  NONE DETECTED (Cut Off  Level 300 ng/mL) Final   POC Cocaine UR 02/18/2023 Positive (A)  NONE DETECTED (Cut Off Level 300 ng/mL) Final   POC Methamphetamine UR 02/18/2023 None Detected  NONE DETECTED (Cut Off Level 1000 ng/mL) Final   POC Morphine 02/18/2023 None Detected  NONE DETECTED (Cut Off Level 300 ng/mL) Final   POC Methadone UR 02/18/2023 None Detected  NONE DETECTED (Cut Off Level 300 ng/mL) Final   POC Oxycodone UR 02/18/2023 None Detected  NONE DETECTED (Cut Off Level 100 ng/mL) Final   POC Marijuana UR 02/18/2023 None Detected  NONE DETECTED  (Cut Off Level 50 ng/mL) Final   Alcohol, Ethyl (B) 02/18/2023 180 (H)  <10 mg/dL Final   Comment: (NOTE) Lowest detectable limit for serum alcohol is 10 mg/dL.  For medical purposes only. Performed at [facility], [address], [place], [place] [address]   Admission on 01/31/2023, Discharged on 01/31/2023  Component Date Value Ref Range Status   WBC 01/31/2023 7.4  4.0 - 10.5 K/uL Final   RBC 01/31/2023 6.12 (H)  4.22 - 5.81 MIL/uL Final   Hemoglobin 01/31/2023 15.7  13.0 - 17.0 g/dL Final   HCT 60/45/4098 49.7  39.0 - 52.0 % Final   MCV 01/31/2023 81.2  80.0 - 100.0 fL Final   MCH 01/31/2023 25.7 (L)  26.0 - 34.0 pg Final   MCHC 01/31/2023 31.6  30.0 - 36.0 g/dL Final   RDW 11/91/4782 14.8  11.5 - 15.5 % Final   Platelets 01/31/2023 343  150 - 400 K/uL Final   nRBC 01/31/2023 0.0  0.0 - 0.2 % Final   Neutrophils Relative % 01/31/2023 52  % Final   Neutro Abs 01/31/2023 3.8  1.7 - 7.7 K/uL Final   Lymphocytes Relative 01/31/2023 37  % Final   Lymphs Abs 01/31/2023 2.8  0.7 - 4.0 K/uL Final   Monocytes Relative 01/31/2023 7  % Final   Monocytes Absolute 01/31/2023 0.5  0.1 - 1.0 K/uL Final   Eosinophils Relative 01/31/2023 3  % Final   Eosinophils Absolute 01/31/2023 0.2  0.0 - 0.5 K/uL Final   Basophils Relative 01/31/2023 1  % Final   Basophils Absolute 01/31/2023 0.1  0.0 - 0.1 K/uL Final   Immature Granulocytes 01/31/2023 0  % Final   Abs Immature Granulocytes 01/31/2023 0.02  0.00 - 0.07 K/uL Final   Performed at [facility], [address], [place], [place] [address]   Sodium 01/31/2023 136  135 - 145 mmol/L Final   Potassium 01/31/2023 3.7  3.5 - 5.1 mmol/L Final   Chloride 01/31/2023 102  98 - 111 mmol/L Final   CO2 01/31/2023 20 (L)  22 - 32 mmol/L Final   Glucose, Bld 01/31/2023 149 (H)  70 - 99 mg/dL Final   Glucose reference range applies only to samples taken after fasting for at least 8 hours.   BUN 01/31/2023 <5 (L)  6 - 20 mg/dL Final    Creatinine, Ser 01/31/2023 0.76  0.61 - 1.24 mg/dL Final   Calcium 30/86/5784 9.5  8.9 - 10.3 mg/dL Final   Total Protein 69/62/9528 7.2  6.5 - 8.1 g/dL Final   Albumin 41/32/4401 3.7  3.5 - 5.0 g/dL Final   AST 02/72/5366 38  15 - 41 U/L Final   ALT 01/31/2023 48 (H)  0 - 44 U/L Final   Alkaline Phosphatase 01/31/2023 47  38 - 126 U/L Final   Total Bilirubin 01/31/2023 0.5  0.3 - 1.2 mg/dL Final  GFR, Estimated 01/31/2023 >60  >60 mL/min Final   Comment: (NOTE) Calculated using the CKD-EPI Creatinine Equation (2021)    Anion gap 01/31/2023 14  5 - 15 Final   Performed at [facility], [address], [place], [place] [address]   Alcohol, Ethyl (B) 01/31/2023 188 (H)  <10 mg/dL Final   Comment: (NOTE) Lowest detectable limit for serum alcohol is 10 mg/dL.  For medical purposes only. Performed at [facility], [address], [place], [place] [address]    Cholesterol 01/31/2023 218 (H)  0 - 200 mg/dL Final   Triglycerides 78/29/5621 192 (H)  <150 mg/dL Final   HDL 30/86/5784 33 (L)  >40 mg/dL Final   Total CHOL/HDL Ratio 01/31/2023 6.6  RATIO Final   VLDL 01/31/2023 38  0 - 40 mg/dL Final   LDL Cholesterol 01/31/2023 147 (H)  0 - 99 mg/dL Final   Comment:        Total Cholesterol/HDL:CHD Risk Coronary Heart Disease Risk Table                     Men   Women  1/2 Average Risk   3.4   3.3  Average Risk       5.0   4.4  2 X Average Risk   9.6   7.1  3 X Average Risk  23.4   11.0        Use the calculated Patient Ratio above and the CHD Risk Table to determine the patient's CHD Risk.        ATP III CLASSIFICATION (LDL):  <100     mg/dL   Optimal  696-295  mg/dL   Near or Above                    Optimal  130-159  mg/dL   Borderline  284-132  mg/dL   High  >440     mg/dL   Very High Performed at [facility], [address] [place], [place] [address]    TSH 01/31/2023 0.993  0.350 - 4.500 uIU/mL Final   Comment: Performed by a 3rd Generation assay with  a functional sensitivity of <=0.01 uIU/mL. Performed at [facility], [address], [place], [place] [address]    POC Amphetamine UR 01/31/2023 None Detected  NONE DETECTED (Cut Off Level 1000 ng/mL) Final   POC Secobarbital (BAR) 01/31/2023 None Detected  NONE DETECTED (Cut Off Level 300 ng/mL) Final   POC Buprenorphine (BUP) 01/31/2023 None Detected  NONE DETECTED (Cut Off Level 10 ng/mL) Final   POC Oxazepam (BZO) 01/31/2023 None Detected  NONE DETECTED (Cut Off Level 300 ng/mL) Final   POC Cocaine UR 01/31/2023 None Detected  NONE DETECTED (Cut Off Level 300 ng/mL) Final   POC Methamphetamine UR 01/31/2023 None Detected  NONE DETECTED (Cut Off Level 1000 ng/mL) Final   POC Morphine 01/31/2023 None Detected  NONE DETECTED (Cut Off Level 300 ng/mL) Final   POC Methadone UR 01/31/2023 None Detected  NONE DETECTED (Cut Off Level 300 ng/mL) Final   POC Oxycodone UR 01/31/2023 None Detected  NONE DETECTED (Cut Off Level 100 ng/mL) Final   POC Marijuana UR 01/31/2023 None Detected  NONE DETECTED (Cut Off Level 50 ng/mL) Final  Admission on 01/24/2023, Discharged on 01/24/2023  Component Date Value Ref Range Status   Sodium 01/24/2023 135  135 - 145 mmol/L Final   Potassium 01/24/2023 3.8  3.5 - 5.1  mmol/L Final   Chloride 01/24/2023 104  98 - 111 mmol/L Final   CO2 01/24/2023 20 (L)  22 - 32 mmol/L Final   Glucose, Bld 01/24/2023 134 (H)  70 - 99 mg/dL Final   Glucose reference range applies only to samples taken after fasting for at least 8 hours.   BUN 01/24/2023 <5 (L)  6 - 20 mg/dL Final   Creatinine, Ser 01/24/2023 0.73  0.61 - 1.24 mg/dL Final   Calcium 16/05/9603 8.9  8.9 - 10.3 mg/dL Final   Total Protein 54/03/8118 7.5  6.5 - 8.1 g/dL Final   Albumin 14/78/2956 4.0  3.5 - 5.0 g/dL Final   AST 21/30/8657 47 (H)  15 - 41 U/L Final   ALT 01/24/2023 57 (H)  0 - 44 U/L Final   Alkaline Phosphatase 01/24/2023 52  38 - 126 U/L Final   Total Bilirubin 01/24/2023 0.8  0.3 - 1.2  mg/dL Final   GFR, Estimated 01/24/2023 >60  >60 mL/min Final   Comment: (NOTE) Calculated using the CKD-EPI Creatinine Equation (2021)    Anion gap 01/24/2023 11  5 - 15 Final   Performed at [facility], [address], [place], [place] [address]   WBC 01/24/2023 6.8  4.0 - 10.5 K/uL Final   RBC 01/24/2023 5.97 (H)  4.22 - 5.81 MIL/uL Final   Hemoglobin 01/24/2023 15.0  13.0 - 17.0 g/dL Final   HCT 29/52/8413 47.8  39.0 - 52.0 % Final   MCV 01/24/2023 80.1  80.0 - 100.0 fL Final   MCH 01/24/2023 25.1 (L)  26.0 - 34.0 pg Final   MCHC 01/24/2023 31.4  30.0 - 36.0 g/dL Final   RDW 24/40/1027 15.4  11.5 - 15.5 % Final   Platelets 01/24/2023 331  150 - 400 K/uL Final   nRBC 01/24/2023 0.0  0.0 - 0.2 % Final   Neutrophils Relative % 01/24/2023 48  % Final   Neutro Abs 01/24/2023 3.3  1.7 - 7.7 K/uL Final   Lymphocytes Relative 01/24/2023 35  % Final   Lymphs Abs 01/24/2023 2.3  0.7 - 4.0 K/uL Final   Monocytes Relative 01/24/2023 10  % Final   Monocytes Absolute 01/24/2023 0.7  0.1 - 1.0 K/uL Final   Eosinophils Relative 01/24/2023 6  % Final   Eosinophils Absolute 01/24/2023 0.4  0.0 - 0.5 K/uL Final   Basophils Relative 01/24/2023 1  % Final   Basophils Absolute 01/24/2023 0.0  0.0 - 0.1 K/uL Final   Immature Granulocytes 01/24/2023 0  % Final   Abs Immature Granulocytes 01/24/2023 0.02  0.00 - 0.07 K/uL Final   Performed at [facility], [address], [place], [place] [address]   Troponin I (High Sensitivity) 01/24/2023 4  <18 ng/L Final   Comment: (NOTE) Elevated high sensitivity troponin I (hsTnI) values and significant  changes across serial measurements may suggest ACS but many other  chronic and acute conditions are known to elevate hsTnI results.  Refer to the "Links" section for chest pain algorithms and additional  guidance. Performed at [facility], [address], [place], [place] [address]    Total CK 01/24/2023 610 (H)  49 - 397 U/L Final    Performed at [facility], [address], [place], [place] [address]   Magnesium 01/24/2023 2.0  1.7 - 2.4 mg/dL Final   Performed at [facility], [address], [place], [place] [address]   Troponin I (High  Sensitivity) 01/24/2023 4  <18 ng/L Final   Comment: (NOTE) Elevated high sensitivity troponin I (hsTnI) values and significant  changes across serial measurements may suggest ACS but many other  chronic and acute conditions are known to elevate hsTnI results.  Refer to the "Links" section for chest pain algorithms and additional  guidance. Performed at [facility], [address] [place], [place] [address]   Admission on 01/06/2023, Discharged on 01/06/2023  Component Date Value Ref Range Status   Sodium 01/06/2023 138  135 - 145 mmol/L Final   Potassium 01/06/2023 3.2 (L)  3.5 - 5.1 mmol/L Final   Chloride 01/06/2023 105  98 - 111 mmol/L Final   CO2 01/06/2023 22  22 - 32 mmol/L Final   Glucose, Bld 01/06/2023 143 (H)  70 - 99 mg/dL Final   Glucose reference range applies only to samples taken after fasting for at least 8 hours.   BUN 01/06/2023 <5 (L)  6 - 20 mg/dL Final   Creatinine, Ser 01/06/2023 0.68  0.61 - 1.24 mg/dL Final   Calcium 09/81/1914 8.6 (L)  8.9 - 10.3 mg/dL Final   GFR, Estimated 01/06/2023 >60  >60 mL/min Final   Comment: (NOTE) Calculated using the CKD-EPI Creatinine Equation (2021)    Anion gap 01/06/2023 11  5 - 15 Final   Performed at [facility], [address] [place], [place] [address]   Troponin I (High Sensitivity) 01/06/2023 2  <18 ng/L Final   Comment: (NOTE) Elevated high sensitivity troponin I (hsTnI) values and significant  changes across serial measurements may suggest ACS but many other  chronic and acute conditions are known to elevate hsTnI results.  Refer to the "Links" section for chest pain algorithms and additional  guidance. Performed at [facility], [address] [place],  [place] [address]    WBC 01/06/2023 7.1  4.0 - 10.5 K/uL Final   RBC 01/06/2023 5.63  4.22 - 5.81 MIL/uL Final   Hemoglobin 01/06/2023 14.4  13.0 - 17.0 g/dL Final   HCT 86/57/8469 45.0  39.0 - 52.0 % Final   MCV 01/06/2023 79.9 (L)  80.0 - 100.0 fL Final   MCH 01/06/2023 25.6 (L)  26.0 - 34.0 pg Final   MCHC 01/06/2023 32.0  30.0 - 36.0 g/dL Final   RDW 62/95/2841 15.7 (H)  11.5 - 15.5 % Final   Platelets 01/06/2023 371  150 - 400 K/uL Final   nRBC 01/06/2023 0.0  0.0 - 0.2 % Final   Neutrophils Relative % 01/06/2023 67  % Final   Neutro Abs 01/06/2023 4.6  1.7 - 7.7 K/uL Final   Lymphocytes Relative 01/06/2023 25  % Final   Lymphs Abs 01/06/2023 1.8  0.7 - 4.0 K/uL Final   Monocytes Relative 01/06/2023 7  % Final   Monocytes Absolute 01/06/2023 0.5  0.1 - 1.0 K/uL Final   Eosinophils Relative 01/06/2023 1  % Final   Eosinophils Absolute 01/06/2023 0.1  0.0 - 0.5 K/uL Final   Basophils Relative 01/06/2023 0  % Final   Basophils Absolute 01/06/2023 0.0  0.0 - 0.1 K/uL Final   Immature Granulocytes 01/06/2023 0  % Final   Abs Immature Granulocytes 01/06/2023 0.02  0.00 - 0.07 K/uL Final   Performed at [facility], [address] [place], [place] [address]   Troponin I (High Sensitivity) 01/06/2023 2  <18 ng/L Final   Comment: (NOTE) Elevated high sensitivity troponin I (hsTnI)  values and significant  changes across serial measurements may suggest ACS but many other  chronic and acute conditions are known to elevate hsTnI results.  Refer to the "Links" section for chest pain algorithms and additional  guidance. Performed at [facility], [address] [place], [place] [address]     Allergies: Patient has no known allergies.  Medications:  Facility Ordered Medications  Medication   acetaminophen (TYLENOL) tablet 650 mg   alum & mag hydroxide-simeth (MAALOX/MYLANTA) 200-200-20 MG/5ML suspension 30 mL   magnesium hydroxide (MILK OF MAGNESIA) suspension 30 mL    traZODone (DESYREL) tablet 50 mg   [COMPLETED] thiamine (VITAMIN B1) injection 100 mg   [START ON 02/19/2023] thiamine (VITAMIN B1) tablet 100 mg   multivitamin with minerals tablet 1 tablet   LORazepam (ATIVAN) tablet 1 mg   hydrOXYzine (ATARAX) tablet 25 mg   loperamide (IMODIUM) capsule 2-4 mg   ondansetron (ZOFRAN-ODT) disintegrating tablet 4 mg   LORazepam (ATIVAN) tablet 1 mg   Followed by   Melene Muller ON 02/19/2023] LORazepam (ATIVAN) tablet 1 mg   Followed by   Melene Muller ON 02/20/2023] LORazepam (ATIVAN) tablet 1 mg   Followed by   Melene Muller ON 02/22/2023] LORazepam (ATIVAN) tablet 1 mg   PTA Medications  Medication Sig   nicotine polacrilex (NICORETTE) 2 MG gum Take 1 each (2 mg total) by mouth as needed for smoking cessation.   traZODone (DESYREL) 50 MG tablet Take 1 tablet (50 mg total) by mouth at bedtime as needed for sleep.   thiamine (VITAMIN B-1) 100 MG tablet Take 1 tablet (100 mg total) by mouth daily.      Medical Decision Making  Erinn Huskins is a 32 year old male presenting voluntarily to [facility] to detox from alcohol.  Patient reports that he drinks daily to 2- 3  40 ounce beers of alcohol and uses cocaine.    Recommendations  Based on my evaluation the patient does not appear to have an emergency medical condition.  Patient will be admitted to [facility] continuous assessment for crisis management, safety and stabilization.  Jasper Riling, NP 02/18/23  6:26 AM

## 2023-02-18 NOTE — ED Notes (Addendum)
Pt is A & O x 4. Pt is oriented to the unit and provided with meal. Medication has been administered to him and pt is lying on his bed. Pt denies SI/HI/AVH. Pt asked if the medications that he will be receiving will be narcotics ? Writer answered no, and asked pt if he has any problem with that , and he sated "no."Will continue to monitor for safety and provide support.

## 2023-02-18 NOTE — BH Assessment (Addendum)
Comprehensive Clinical Assessment (CCA) Note  02/18/2023 [name] [id number]  Disposition: Roselyn Bering, NP recommends pt to be admitted to [facility] for Continuous Assessment.   The patient demonstrates the following risk factors for suicide: Chronic risk factors for suicide include: substance use disorder. Acute risk factors for suicide include: N/A. Protective factors for this patient include:  Pt denies, SI, HI . Considering these factors, the overall suicide risk at this point appears to be no risk. Patient is not appropriate for outpatient follow up.  [name] is a 32 year old male who presents voluntary and unaccompanied to [facility]. Clinician asked the pt, "what brought you to the hospital?" Pt presented to [facility] earlier today (02/17/2023) but left AMA because he had a lot going on. Pt reports, he returned to [facility] alone and wants to get help with his drug use. Pt denies, SI, HI, AVH, self-injurious behaviors and access to weapons.   Pt reports, using Cocaine (not sure when he last used or how much he used) and drinking a beer 30 minutes before presenting to [facility]. Pt reports, he drinks alcohol every other day. Pt [facility]-02/02/2023. Pt denies, being linked to outpatient resources. Per chart pt left AMA because it reminded him of jail. Clinician asked the pt if he followed up with [facility] as he was accepted to their program and was to present there on Monday 02/05/2023. Pt reports, he did not go because it's close to people he knows.   Pt presents quiet, awake, disheveled with soft speech. Pt was tearful during the rest of the assessment after asking about his children. Pt denies, feeling, depressed, anxious, etc. Pt became tearful/emotional for the remainder of the assessment after asking about his children. Pt did not discuss his relationship with his children. Pt's mood was sad. Pt's affect was flat.  Pt's insight and judgement poor.   *Pt declined for clinician to contact anyone to obtain additional information.*  Chief Complaint:  Chief Complaint  Patient presents with   Addiction Problem   Visit Diagnosis:  Cocaine use Disorder, severe.                               Alcohol use Disorder, severe.   CCA Screening, Triage and Referral (STR)  Patient Reported Information How did you hear about [place]? Self  What Is the Reason for Your Visit/Call Today? Pt presented to [facility] earlier but left AMA today (02/17/2023) because he had a lot going on. Pt reports, he came back because he wanted help with drugs. Pt denies, SI, HI, AVH, self-injurious behaviors and access to weapons.  How Long Has This Been Causing You Problems? > than 6 months  What Do You Feel Would Help You the Most Today? Alcohol or Drug Use Treatment   Have You Recently Had Any Thoughts About Hurting Yourself? No  Are You Planning to Commit Suicide/Harm Yourself At This time? No   Flowsheet Row ED from 02/18/2023 in [facility] ED from 02/17/2023 in [facility] ED from 01/31/2023 in [facility]  C-SSRS RISK CATEGORY No Risk No Risk No Risk       Have you Recently Had Thoughts About Hurting Someone [name]? No  Are You Planning to Harm Someone at This Time? No  Explanation: Pt denies, HI.   Have You Used Any Alcohol or Drugs  in the Past 24 Hours? Yes  What Did You Use and How Much? Pt reports, he had a beer.   Do You Currently Have a Therapist/Psychiatrist? No  Name of Therapist/Psychiatrist: Name of Therapist/Psychiatrist: Pt denies, being linked to outpatient resources.   Have You Been Recently Discharged From Any Office Practice or Programs? Yes  Explanation of Discharge From Practice/Program: Pt left AMA from [facility] at [facility] on 02/02/2023 after two days from admission.     CCA Screening  Triage Referral Assessment Type of Contact: Face-to-Face  Telemedicine Service Delivery:   Is this Initial or Reassessment?   Date Telepsych consult ordered in [facility]:    Time Telepsych consult ordered in [facility]:    Location of Assessment: [facility]  Provider Location: [facility] Assessment Services   Collateral Involvement: Pt declined for clinician to contact anyone to obtain additional information.   Does Patient Have a Automotive engineer Guardian? No  Legal Guardian Contact Information: Pt is his own guardian.  Copy of Legal Guardianship Form: -- (Pt is his own guardian.)  Legal Guardian Notified of Arrival: -- (Pt is his own guardian.)  Legal Guardian Notified of Pending Discharge: -- (Pt is his own guardian.)  If Minor and Not Living with Parent(s), Who has Custody? Pt is an adult and his own guardian.  Is CPS involved or ever been involved? Never  Is APS involved or ever been involved? Never   Patient Determined To Be At Risk for Harm To Self or Others Based on Review of Patient Reported Information or Presenting Complaint? No  Method: No Plan (Pt denies, SI/HI.)  Availability of Means: No access or NA (Pt denies, SI/HI.)  Intent: Vague intent or NA (Pt denies, SI/HI.)  Notification Required: No need or identified person (Pt denies, SI/HI.)  Additional Information for Danger to Others Potential: -- (Pt denies, SI/HI.)  Additional Comments for Danger to Others Potential: Pt denies, SI/HI.  Are There Guns or Other Weapons in Your Home? No  Types of Guns/Weapons: Pt denies, access to weapons.  Are These Weapons Safely Secured?                            -- (Pt denies, access to weapons.)  Who Could Verify You Are Able To Have These Secured: Pt denies, access to weapons.  Do You Have any Outstanding Charges, Pending Court Dates, Parole/Probation? Pt denies, legal involvement.  Contacted To Inform of Risk of Harm To Self or Others: Other: Comment  (Pt denies, SI/HI.)    Does Patient Present under Involuntary Commitment? No    [place] of Residence: [place]   Patient Currently Receiving the Following Services: Not Receiving Services   Determination of Need: Urgent (48 hours)   Options For Referral: Facility-Based Crisis; [facility] Urgent Care; Intensive Outpatient Therapy     CCA Biopsychosocial Patient Reported Schizophrenia/Schizoaffective Diagnosis in Past: No   Strengths: Pt wanting help with substance use.   Mental Health Symptoms Depression:   Tearfulness; Difficulty Concentrating   Duration of Depressive symptoms:  Duration of Depressive Symptoms: Less than two weeks   Mania:   None   Anxiety:    Difficulty concentrating   Psychosis:   None   Duration of Psychotic symptoms:    Trauma:   None   Obsessions:   None   Compulsions:   None   Inattention:   None   Hyperactivity/Impulsivity:   None   Oppositional/Defiant  Behaviors:   None   Emotional Irregularity:   None   Other Mood/Personality Symptoms:   NA    Mental Status Exam Appearance and self-care  Stature:   Tall   Weight:   Overweight   Clothing:   Disheveled   Grooming:   Normal   Cosmetic use:   None   Posture/gait:   Slumped   Motor activity:   Not Remarkable   Sensorium  Attention:   Distractible   Concentration:   Normal   Orientation:   Person; Place   Recall/memory:   Defective in Immediate; Defective in Recent   Affect and Mood  Affect:   Flat   Mood:   Other (Comment) (Sad.)   Relating  Eye contact:   Avoided   Facial expression:   Depressed   Attitude toward examiner:   Guarded   Thought and Language  Speech flow:  Slurred   Thought content:   Appropriate to Mood and Circumstances   Preoccupation:   None   Hallucinations:   None   Organization:   Circumstantial   Company secretary of Knowledge:   Poor   Intelligence:   Average   Abstraction:    Functional   Judgement:   Poor   Reality Testing:   Adequate   Insight:   Poor   Decision Making:   Impulsive   Social Functioning  Social Maturity:   Impulsive   Social Judgement:   "Street Smart"   Stress  Stressors:   Other (Comment) (Pt denies, having stressors.)   Coping Ability:   Overwhelmed; Exhausted; Deficient supports   Skill Deficits:   Decision making; Responsibility; Self-control   Supports:   Support needed     Religion: Religion/Spirituality Are You A Religious Person?: No What is Your Religious Affiliation?:  (Pt denies, being religious.) How Might This Affect Treatment?: Pt denies, being religious.  Leisure/Recreation: Leisure / Recreation Do You Have Hobbies?: No  Exercise/Diet: Exercise/Diet Do You Exercise?: No Have You Gained or Lost A Significant Amount of Weight in the Past Six Months?: No Do You Follow a Special Diet?: No Do You Have Any Trouble Sleeping?: No Explanation of Sleeping Difficulties: Pt denies, difficulties sleeping.   CCA Employment/Education Employment/Work Situation: Employment / Work Situation Employment Situation: Unemployed Patient's Job has Been Impacted by Current Illness: No Has Patient ever Been in Equities trader?: No  Education: Education Is Patient Currently Attending School?: No Last Grade Completed: 10 Did You Product manager?: No Did You Have An Individualized Education Program (IIEP): No Did You Have Any Difficulty At School?: No Patient's Education Has Been Impacted by Current Illness: No   CCA Family/Childhood History Family and Relationship History: Family history Marital status: Married Number of Years Married:  (Pt reports, he doesn't know.) What types of issues is patient dealing with in the relationship?: Pt did not discuss his relationship. Additional relationship information: Pt did not discuss his relationship. Does patient have children?: Yes How many children?: 4 How is  patient's relationship with their children?: Pt became tearful for the remainder of the assessment after asking about his children. Pt did not discuss his relationship.  Childhood History:  Childhood History By whom was/is the patient raised?: Both parents Did patient suffer any verbal/emotional/physical/sexual abuse as a child?: No Did patient suffer from severe childhood neglect?: No Has patient ever been sexually abused/assaulted/raped as an adolescent or adult?: No Was the patient ever a victim of a crime or a disaster?: No Witnessed domestic violence?:  No Has patient been affected by domestic violence as an adult?: No   CCA Substance Use Alcohol/Drug Use: Alcohol / Drug Use Pain Medications: See MAR Prescriptions: See MAR Over the Counter: See MAR History of alcohol / drug use?: Yes Longest period of sobriety (when/how long): Unsure. Negative Consequences of Use: Personal relationships Withdrawal Symptoms: None Substance #1 Name of Substance 1: Cocaine. 1 - Age of First Use: 17. 1 - Amount (size/oz): Pt reports, he doesn't know. 1 - Frequency: Pt reports, he's unsure. 1 - Duration: Ongoing. 1 - Last Use / Amount: Last night. 1 - Method of Aquiring: Purchase. 1- Route of Use: Unsure. Substance #2 Name of Substance 2: Alcohol. 2 - Age of First Use: Unsure. 2 - Amount (size/oz): Pt reports, drinking 30 minutes before presenting to [facility]. 2 - Frequency: Pt reports, he's unsure. 2 - Duration: Pt reports, every other day. 2 - Last Use / Amount: 02/17/2023. 2 - Method of Aquiring: Purchase. 2 - Route of Substance Use: Oral. Substance #3 Name of Substance 3: NA 3 - Age of First Use: NA 3 - Amount (size/oz): NA 3 - Frequency: NA 3 - Duration: NA 3 - Last Use / Amount: NA 3 - Method of Aquiring: NA 3 - Route of Substance Use: NA    ASAM's:  Six Dimensions of Multidimensional Assessment  Dimension 1:  Acute Intoxication and/or Withdrawal Potential:   Dimension 1:   Description of individual's past and current experiences of substance use and withdrawal: During the assessment pt did not disclose experiencing withdrawal symptoms.  Dimension 2:  Biomedical Conditions and Complications:   Dimension 2:  Description of patient's biomedical conditions and  complications: Per chart, pt previously having a heart attack.  Dimension 3:  Emotional, Behavioral, or Cognitive Conditions and Complications:  Dimension 3:  Description of emotional, behavioral, or cognitive conditions and complications: Pt became very emotional when asked about his children, pt presents sad with flat affect.  Dimension 4:  Readiness to Change:  Dimension 4:  Description of Readiness to Change criteria: Pt reports, he came back alone because he wants help with his substance use.  Dimension 5:  Relapse, Continued use, or Continued Problem Potential:  Dimension 5:  Relapse, continued use, or continued problem potential critiera description: Pt has ongoing substance use.  Dimension 6:  Recovery/Living Environment:  Dimension 6:  Recovery/Iiving environment criteria description: Pt denies, having supports.  ASAM Severity Score: ASAM's Severity Rating Score: 10  ASAM Recommended Level of Treatment: ASAM Recommended Level of Treatment: Level II Intensive Outpatient Treatment   Substance use Disorder (SUD) Substance Use Disorder (SUD)  Checklist Symptoms of Substance Use: Continued use despite having a persistent/recurrent physical/psychological problem caused/exacerbated by use, Continued use despite persistent or recurrent social, interpersonal problems, caused or exacerbated by use, Evidence of tolerance  Recommendations for Services/Supports/Treatments: Recommendations for Services/Supports/Treatments Recommendations For Services/Supports/Treatments: Other (Comment) (Pt to be admitted to [facility] for  Continuous Assessment.)  Discharge Disposition: Discharge Disposition Medical Exam completed:  Yes  DSM5 Diagnoses: Patient Active Problem List   Diagnosis Date Noted   Alcohol use disorder 01/31/2023     Referrals to Alternative Service(s): Referred to Alternative Service(s):   Place:   Date:   Time:    Referred to Alternative Service(s):   Place:   Date:   Time:    Referred to Alternative Service(s):   Place:   Date:   Time:    Referred to Alternative Service(s):   Place:   Date:  Time:     Redmond Pulling, [facility] Comprehensive Clinical Assessment (CCA) Screening, Triage and Referral Note  02/18/2023 [name] [id number]  Chief Complaint:  Chief Complaint  Patient presents with   Addiction Problem   Visit Diagnosis:   Patient Reported Information How did you hear about [place]? Self  What Is the Reason for Your Visit/Call Today? Pt presented to [facility] earlier but left AMA today (02/17/2023) because he had a lot going on. Pt reports, he came back because he wanted help with drugs. Pt denies, SI, HI, AVH, self-injurious behaviors and access to weapons.  How Long Has This Been Causing You Problems? > than 6 months  What Do You Feel Would Help You the Most Today? Alcohol or Drug Use Treatment   Have You Recently Had Any Thoughts About Hurting Yourself? No  Are You Planning to Commit Suicide/Harm Yourself At This time? No   Have you Recently Had Thoughts About Hurting Someone [name]? No  Are You Planning to Harm Someone at This Time? No  Explanation: Pt denies, HI.   Have You Used Any Alcohol or Drugs in the Past 24 Hours? Yes  How Long Ago Did You Use Drugs or Alcohol? No data recorded What Did You Use and How Much? Pt reports, he had a beer.   Do You Currently Have a Therapist/Psychiatrist? No  Name of Therapist/Psychiatrist: Pt denies, being linked to outpatient resources.   Have You Been Recently Discharged From Any Office Practice or Programs? Yes  Explanation of Discharge From Practice/Program: Pt left AMA from Facility Based Crisis Center  at [facility] on 02/02/2023 after two days from admission.    CCA Screening Triage Referral Assessment Type of Contact: Face-to-Face  Telemedicine Service Delivery:   Is this Initial or Reassessment?   Date Telepsych consult ordered in [facility]:    Time Telepsych consult ordered in [facility]:    Location of Assessment: [facility] Assessment Services  Provider Location: [facility] Assessment Services    Collateral Involvement: Pt declined for clinician to contact anyone to obtain additional information.   Does Patient Have a Automotive engineer Guardian? No data recorded Name and Contact of Legal Guardian: No data recorded If Minor and Not Living with Parent(s), Who has Custody? Pt is an adult and his own guardian.  Is CPS involved or ever been involved? Never  Is APS involved or ever been involved? Never   Patient Determined To Be At Risk for Harm To Self or Others Based on Review of Patient Reported Information or Presenting Complaint? No  Method: No Plan (Pt denies, SI/HI.)  Availability of Means: No access or NA (Pt denies, SI/HI.)  Intent: Vague intent or NA (Pt denies, SI/HI.)  Notification Required: No need or identified person (Pt denies, SI/HI.)  Additional Information for Danger to Others Potential: -- (Pt denies, SI/HI.)  Additional Comments for Danger to Others Potential: Pt denies, SI/HI.  Are There Guns or Other Weapons in Your Home? No  Types of Guns/Weapons: Pt denies, access to weapons.  Are These Weapons Safely Secured?                            -- (Pt denies, access to weapons.)  Who Could Verify You Are Able To Have These Secured: Pt denies, access to weapons.  Do You Have any Outstanding Charges, Pending Court Dates, Parole/Probation? Pt denies, legal involvement.  Contacted To Inform of Risk of Harm To Self or  Others: Other: Comment (Pt denies, SI/HI.)   Does Patient Present under Involuntary Commitment? No    [place] of Residence: [place]   Patient  Currently Receiving the Following Services: Not Receiving Services   Determination of Need: Urgent (48 hours)   Options For Referral: Facility-Based Crisis; [facility] Urgent Care; Intensive Outpatient Therapy   Discharge Disposition:  Discharge Disposition Medical Exam completed: Yes  Redmond Pulling, [facility]   Redmond Pulling, MS, [facility], [facility] Triage Specialist [phone]

## 2023-02-18 NOTE — ED Provider Notes (Signed)
Facility Based Crisis Admission H&P  Date: 02/18/23 Patient Name: [name] MRN: [id number] Chief Complaint: "help with alcohol detox."  Diagnoses:  Final diagnoses:  Alcohol use disorder  Stimulant use disorder  Substance induced mood disorder (HCC)  Nicotine use disorder    HPI: [name] is a 32 year old male with a psychiatric history of alcohol use disorder and stimulant use disorder (cocaine) c/b prev MI who was admitted to the [facility] for detox after initial discharge from [facility].  Upon initial [facility] observation unit admission, patient reported that he had to tend to items of business and wanted to discharge home. After discharge to the lobby, he instead reported that he would like help. When inquired about this, he states that he "isn't sure what happened over there, but I am ready for help." He further explains that he was not previously ready to receive help during his previous [facility] admission because he had to handle his responsibilities, but those affairs are now managed.   He has good social support in his wife and other family members who have been pushing him to get help. He is currently unemployed and living with his grandmother.  Patient denies low mood and low energy and denies changes to his sleep or appetite. He denies sx of anxiety, PTSD, or mania/hypomania. He denies SI, HI, and AVH.   He denies somatic sx of w/d today. He is observed snoring loudly, but without apneic events during a nap. He denies feeling short of breath or awaking attempting to catch his breath. He says that he is amenable to residential substance use treatment at this time.  PHQ 2-9:  Flowsheet Row ED from 01/31/2023 in [facility] Most recent reading at 01/31/2023  3:56 AM ED from 01/31/2023 in [facility] Most recent reading at 01/31/2023  3:00 AM  Thoughts that you would be better off dead, or of hurting yourself in some way Several  days Several days  PHQ-9 Total Score 9 9       Flowsheet Row ED from 02/18/2023 in [facility] Most recent reading at 02/18/2023 12:04 PM ED from 02/18/2023 in [facility] Most recent reading at 02/18/2023  2:17 AM ED from 02/17/2023 in [facility] Most recent reading at 02/17/2023  5:46 PM  C-SSRS RISK CATEGORY No Risk No Risk No Risk       Screenings    Flowsheet Row Most Recent Value  CIWA-Ar Total 0       Total Time spent with patient: 45 minutes  Musculoskeletal  Strength & Muscle Tone: within normal limits Gait & Station: normal Patient leans: N/A  Psychiatric Specialty Exam  Presentation General Appearance:  Disheveled  Eye Contact: Minimal  Speech: Slurred  Speech Volume: Decreased  Handedness: Right   Mood and Affect  Mood: Depressed  Affect: Congruent   Thought Process  Thought Processes: Coherent  Descriptions of Associations:Intact  Orientation:Full (Time, Place and Person)  Thought Content:WDL  Diagnosis of Schizophrenia or Schizoaffective disorder in past: No   Hallucinations:Hallucinations: None Description of Auditory Hallucinations: denies Description of Visual Hallucinations: denies  Ideas of Reference:None  Suicidal Thoughts:Suicidal Thoughts: No  Homicidal Thoughts:Homicidal Thoughts: No   Sensorium  Memory: Immediate Good; Recent Good; Remote Good  Judgment: Poor  Insight: Poor   Executive Functions  Concentration: Fair  Attention Span: Fair  Recall: Fair  Fund of Knowledge: Fair  Language: Good   Psychomotor Activity  Psychomotor Activity: Psychomotor Activity: Normal   Assets  Assets: Desire for Improvement; Resilience; Physical Health   Sleep  Sleep: Sleep: Poor Number of Hours of Sleep: 3   Nutritional Assessment (For OBS and FBC admissions only) Has the patient had a weight loss or gain of 10  pounds or more in the last 3 months?: No Has the patient had a decrease in food intake/or appetite?: No Does the patient have dental problems?: No Does the patient have eating habits or behaviors that may be indicators of an eating disorder including binging or inducing vomiting?: No Has the patient recently lost weight without trying?: 0 Has the patient been eating poorly because of a decreased appetite?: 0 Malnutrition Screening Tool Score: 0    Physical Exam Vitals reviewed.  Constitutional:      General: He is not in acute distress.    Appearance: He is obese. He is not toxic-appearing.  HENT:     Head: Normocephalic and atraumatic.     Mouth/Throat:     Mouth: Mucous membranes are moist.     Pharynx: Oropharynx is clear.  Pulmonary:     Effort: Pulmonary effort is normal.  Skin:    General: Skin is warm and dry.  Neurological:     General: No focal deficit present.     Mental Status: He is oriented to person, place, and time.    Review of Systems  Gastrointestinal: Negative.   Musculoskeletal:  Negative for myalgias.  Neurological:  Negative for dizziness, tremors, seizures and headaches.    Blood pressure 131/82, pulse 81, temperature 98.3 F (36.8 C), temperature source Oral, resp. rate 17, SpO2 98%. There is no height or weight on file to calculate BMI.  Past Psychiatric History: Alcohol abuse, cocaine use disorder  Is the patient at risk to self? No  Has the patient been a risk to self in the past 6 months? No .    Has the patient been a risk to self within the distant past? No   Is the patient a risk to others? No   Has the patient been a risk to others in the past 6 months? No   Has the patient been a risk to others within the distant past? No   Past Medical History:  Past Medical History:  Diagnosis Date   MI (myocardial infarction) (HCC)    Reported by patient    Family History: multiple family members with schizophrenia  Social History: Lives with  grandmother. 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 Married. Drinks 2-3 40 oz beers daily and sometimes hard alcohol. Uses cocaine approx 2 days per week.  Last Labs:  Admission on 02/18/2023, Discharged on 02/18/2023  Component Date Value Ref Range Status   WBC 02/18/2023 9.3  4.0 - 10.5 K/uL Final   RBC 02/18/2023 5.80  4.22 - 5.81 MIL/uL Final   Hemoglobin 02/18/2023 14.9  13.0 - 17.0 g/dL Final   HCT 78/29/5621 47.3  39.0 - 52.0 % Final   MCV 02/18/2023 81.6  80.0 - 100.0 fL Final   MCH 02/18/2023 25.7 (L)  26.0 - 34.0 pg Final   MCHC 02/18/2023 31.5  30.0 - 36.0 g/dL Final   RDW 30/86/5784 17.6 (H)  11.5 - 15.5 % Final   Platelets 02/18/2023 342  150 - 400 K/uL Final   nRBC 02/18/2023 0.0  0.0 - 0.2 % Final   Neutrophils Relative % 02/18/2023 39  % Final  Neutro Abs 02/18/2023 3.6  1.7 - 7.7 K/uL Final   Lymphocytes Relative 02/18/2023 48  % Final   Lymphs Abs 02/18/2023 4.5 (H)  0.7 - 4.0 K/uL Final   Monocytes Relative 02/18/2023 6  % Final   Monocytes Absolute 02/18/2023 0.6  0.1 - 1.0 K/uL Final   Eosinophils Relative 02/18/2023 6  % Final   Eosinophils Absolute 02/18/2023 0.6 (H)  0.0 - 0.5 K/uL Final   Basophils Relative 02/18/2023 1  % Final   Basophils Absolute 02/18/2023 0.1  0.0 - 0.1 K/uL Final   Immature Granulocytes 02/18/2023 0  % Final   Abs Immature Granulocytes 02/18/2023 0.03  0.00 - 0.07 K/uL Final   Performed at [facility], [address], [place], [place] [address]   Sodium 02/18/2023 137  135 - 145 mmol/L Final   Potassium 02/18/2023 4.3  3.5 - 5.1 mmol/L Final   Chloride 02/18/2023 105  98 - 111 mmol/L Final   CO2 02/18/2023 20 (L)  22 - 32 mmol/L Final   Glucose, Bld 02/18/2023 168 (H)  70 - 99 mg/dL Final   Glucose reference range applies only to samples taken after fasting for at least 8 hours.   BUN 02/18/2023 <5 (L)  6 - 20 mg/dL Final   Creatinine, Ser 02/18/2023 0.80  0.61 -  1.24 mg/dL Final   Calcium 66/12/3014 9.1  8.9 - 10.3 mg/dL Final   Total Protein 08/08/3233 7.0  6.5 - 8.1 g/dL Final   Albumin 57/32/2025 3.8  3.5 - 5.0 g/dL Final   AST 42/70/6237 86 (H)  15 - 41 U/L Final   ALT 02/18/2023 77 (H)  0 - 44 U/L Final   Alkaline Phosphatase 02/18/2023 54  38 - 126 U/L Final   Total Bilirubin 02/18/2023 1.1  0.3 - 1.2 mg/dL Final   GFR, Estimated 02/18/2023 >60  >60 mL/min Final   Comment: (NOTE) Calculated using the CKD-EPI Creatinine Equation (2021)    Anion gap 02/18/2023 12  5 - 15 Final   Performed at [facility], [address], [place], [place] [address]   Cholesterol 02/18/2023 224 (H)  0 - 200 mg/dL Final   Triglycerides 51/76/1607 315 (H)  <150 mg/dL Final   HDL 37/04/6268 37 (L)  >40 mg/dL Final   Total CHOL/HDL Ratio 02/18/2023 6.1  RATIO Final   VLDL 02/18/2023 63 (H)  0 - 40 mg/dL Final   LDL Cholesterol 02/18/2023 124 (H)  0 - 99 mg/dL Final   Comment:        Total Cholesterol/HDL:CHD Risk Coronary Heart Disease Risk Table                     Men   Women  1/2 Average Risk   3.4   3.3  Average Risk       5.0   4.4  2 X Average Risk   9.6   7.1  3 X Average Risk  23.4   11.0        Use the calculated Patient Ratio above and the CHD Risk Table to determine the patient's CHD Risk.        ATP III CLASSIFICATION (LDL):  <100     mg/dL   Optimal  485-462  mg/dL   Near or Above                    Optimal  130-159  mg/dL   Borderline  703-500  mg/dL   High  >409     mg/dL   Very High Performed at [facility], [address], [place], [place] [address]    POC Amphetamine UR 02/18/2023 None Detected  NONE DETECTED (Cut Off Level 1000 ng/mL) Final   POC Secobarbital (BAR) 02/18/2023 None Detected  NONE DETECTED (Cut Off Level 300 ng/mL) Final   POC Buprenorphine (BUP) 02/18/2023 None Detected  NONE DETECTED (Cut Off Level 10 ng/mL) Final   POC Oxazepam (BZO) 02/18/2023 Positive (A)  NONE DETECTED (Cut Off Level 300 ng/mL)  Final   POC Cocaine UR 02/18/2023 Positive (A)  NONE DETECTED (Cut Off Level 300 ng/mL) Final   POC Methamphetamine UR 02/18/2023 None Detected  NONE DETECTED (Cut Off Level 1000 ng/mL) Final   POC Morphine 02/18/2023 None Detected  NONE DETECTED (Cut Off Level 300 ng/mL) Final   POC Methadone UR 02/18/2023 None Detected  NONE DETECTED (Cut Off Level 300 ng/mL) Final   POC Oxycodone UR 02/18/2023 None Detected  NONE DETECTED (Cut Off Level 100 ng/mL) Final   POC Marijuana UR 02/18/2023 None Detected  NONE DETECTED (Cut Off Level 50 ng/mL) Final   Alcohol, Ethyl (B) 02/18/2023 180 (H)  <10 mg/dL Final   Comment: (NOTE) Lowest detectable limit for serum alcohol is 10 mg/dL.  For medical purposes only. Performed at [facility], [address], [place], [place] [address]   Admission on 01/31/2023, Discharged on 01/31/2023  Component Date Value Ref Range Status   WBC 01/31/2023 7.4  4.0 - 10.5 K/uL Final   RBC 01/31/2023 6.12 (H)  4.22 - 5.81 MIL/uL Final   Hemoglobin 01/31/2023 15.7  13.0 - 17.0 g/dL Final   HCT 56/21/3086 49.7  39.0 - 52.0 % Final   MCV 01/31/2023 81.2  80.0 - 100.0 fL Final   MCH 01/31/2023 25.7 (L)  26.0 - 34.0 pg Final   MCHC 01/31/2023 31.6  30.0 - 36.0 g/dL Final   RDW 57/84/6962 14.8  11.5 - 15.5 % Final   Platelets 01/31/2023 343  150 - 400 K/uL Final   nRBC 01/31/2023 0.0  0.0 - 0.2 % Final   Neutrophils Relative % 01/31/2023 52  % Final   Neutro Abs 01/31/2023 3.8  1.7 - 7.7 K/uL Final   Lymphocytes Relative 01/31/2023 37  % Final   Lymphs Abs 01/31/2023 2.8  0.7 - 4.0 K/uL Final   Monocytes Relative 01/31/2023 7  % Final   Monocytes Absolute 01/31/2023 0.5  0.1 - 1.0 K/uL Final   Eosinophils Relative 01/31/2023 3  % Final   Eosinophils Absolute 01/31/2023 0.2  0.0 - 0.5 K/uL Final   Basophils Relative 01/31/2023 1  % Final   Basophils Absolute 01/31/2023 0.1  0.0 - 0.1 K/uL Final   Immature Granulocytes 01/31/2023 0  % Final   Abs Immature  Granulocytes 01/31/2023 0.02  0.00 - 0.07 K/uL Final   Performed at [facility], [address] [place], [place] [address]   Sodium 01/31/2023 136  135 - 145 mmol/L Final   Potassium 01/31/2023 3.7  3.5 - 5.1 mmol/L Final   Chloride 01/31/2023 102  98 - 111 mmol/L Final   CO2 01/31/2023 20 (L)  22 - 32 mmol/L Final   Glucose, Bld 01/31/2023 149 (H)  70 - 99 mg/dL Final   Glucose reference range applies only to samples taken after fasting for at least 8 hours.   BUN 01/31/2023 <5 (L)  6 - 20 mg/dL Final  Creatinine, Ser 01/31/2023 0.76  0.61 - 1.24 mg/dL Final   Calcium 40/98/1191 9.5  8.9 - 10.3 mg/dL Final   Total Protein 47/82/9562 7.2  6.5 - 8.1 g/dL Final   Albumin 13/02/6577 3.7  3.5 - 5.0 g/dL Final   AST 46/96/2952 38  15 - 41 U/L Final   ALT 01/31/2023 48 (H)  0 - 44 U/L Final   Alkaline Phosphatase 01/31/2023 47  38 - 126 U/L Final   Total Bilirubin 01/31/2023 0.5  0.3 - 1.2 mg/dL Final   GFR, Estimated 01/31/2023 >60  >60 mL/min Final   Comment: (NOTE) Calculated using the CKD-EPI Creatinine Equation (2021)    Anion gap 01/31/2023 14  5 - 15 Final   Performed at [facility], [address], [place], [place] [address]   Alcohol, Ethyl (B) 01/31/2023 188 (H)  <10 mg/dL Final   Comment: (NOTE) Lowest detectable limit for serum alcohol is 10 mg/dL.  For medical purposes only. Performed at [facility], [address] [place], [place] [address]    Cholesterol 01/31/2023 218 (H)  0 - 200 mg/dL Final   Triglycerides 27/25/3664 192 (H)  <150 mg/dL Final   HDL 40/34/7425 33 (L)  >40 mg/dL Final   Total CHOL/HDL Ratio 01/31/2023 6.6  RATIO Final   VLDL 01/31/2023 38  0 - 40 mg/dL Final   LDL Cholesterol 01/31/2023 147 (H)  0 - 99 mg/dL Final   Comment:        Total Cholesterol/HDL:CHD Risk Coronary Heart Disease Risk Table                     Men   Women  1/2 Average Risk   3.4   3.3  Average Risk       5.0   4.4  2 X Average Risk   9.6   7.1  3 X  Average Risk  23.4   11.0        Use the calculated Patient Ratio above and the CHD Risk Table to determine the patient's CHD Risk.        ATP III CLASSIFICATION (LDL):  <100     mg/dL   Optimal  956-387  mg/dL   Near or Above                    Optimal  130-159  mg/dL   Borderline  564-332  mg/dL   High  >951     mg/dL   Very High Performed at [facility], [address] [place], [place] [address]    TSH 01/31/2023 0.993  0.350 - 4.500 uIU/mL Final   Comment: Performed by a 3rd Generation assay with a functional sensitivity of <=0.01 uIU/mL. Performed at [facility], [address] [place], [place] [address]    POC Amphetamine UR 01/31/2023 None Detected  NONE DETECTED (Cut Off Level 1000 ng/mL) Final   POC Secobarbital (BAR) 01/31/2023 None Detected  NONE DETECTED (Cut Off Level 300 ng/mL) Final   POC Buprenorphine (BUP) 01/31/2023 None Detected  NONE DETECTED (Cut Off Level 10 ng/mL) Final   POC Oxazepam (BZO) 01/31/2023 None Detected  NONE DETECTED (Cut Off Level 300 ng/mL) Final   POC Cocaine UR 01/31/2023 None Detected  NONE DETECTED (Cut Off Level 300 ng/mL) Final   POC Methamphetamine UR 01/31/2023 None Detected  NONE DETECTED (Cut Off Level 1000 ng/mL) Final   POC Morphine 01/31/2023 None Detected  NONE DETECTED (Cut Off Level 300 ng/mL) Final   POC Methadone UR 01/31/2023 None Detected  NONE DETECTED (Cut Off Level 300 ng/mL) Final   POC Oxycodone UR 01/31/2023 None Detected  NONE DETECTED (Cut Off Level 100 ng/mL) Final   POC Marijuana UR 01/31/2023 None Detected  NONE DETECTED (Cut Off Level 50 ng/mL) Final  Admission on 01/24/2023, Discharged on 01/24/2023  Component Date Value Ref Range Status   Sodium 01/24/2023 135  135 - 145 mmol/L Final   Potassium 01/24/2023 3.8  3.5 - 5.1 mmol/L Final   Chloride 01/24/2023 104  98 - 111 mmol/L Final   CO2 01/24/2023 20 (L)  22 - 32 mmol/L Final   Glucose, Bld 01/24/2023 134 (H)  70 - 99 mg/dL Final   Glucose  reference range applies only to samples taken after fasting for at least 8 hours.   BUN 01/24/2023 <5 (L)  6 - 20 mg/dL Final   Creatinine, Ser 01/24/2023 0.73  0.61 - 1.24 mg/dL Final   Calcium 65/78/4696 8.9  8.9 - 10.3 mg/dL Final   Total Protein 29/52/8413 7.5  6.5 - 8.1 g/dL Final   Albumin 24/40/1027 4.0  3.5 - 5.0 g/dL Final   AST 25/36/6440 47 (H)  15 - 41 U/L Final   ALT 01/24/2023 57 (H)  0 - 44 U/L Final   Alkaline Phosphatase 01/24/2023 52  38 - 126 U/L Final   Total Bilirubin 01/24/2023 0.8  0.3 - 1.2 mg/dL Final   GFR, Estimated 01/24/2023 >60  >60 mL/min Final   Comment: (NOTE) Calculated using the CKD-EPI Creatinine Equation (2021)    Anion gap 01/24/2023 11  5 - 15 Final   Performed at [facility], [address], [place], [place] [address]   WBC 01/24/2023 6.8  4.0 - 10.5 K/uL Final   RBC 01/24/2023 5.97 (H)  4.22 - 5.81 MIL/uL Final   Hemoglobin 01/24/2023 15.0  13.0 - 17.0 g/dL Final   HCT 59/56/3875 47.8  39.0 - 52.0 % Final   MCV 01/24/2023 80.1  80.0 - 100.0 fL Final   MCH 01/24/2023 25.1 (L)  26.0 - 34.0 pg Final   MCHC 01/24/2023 31.4  30.0 - 36.0 g/dL Final   RDW 64/33/2951 15.4  11.5 - 15.5 % Final   Platelets 01/24/2023 331  150 - 400 K/uL Final   nRBC 01/24/2023 0.0  0.0 - 0.2 % Final   Neutrophils Relative % 01/24/2023 48  % Final   Neutro Abs 01/24/2023 3.3  1.7 - 7.7 K/uL Final   Lymphocytes Relative 01/24/2023 35  % Final   Lymphs Abs 01/24/2023 2.3  0.7 - 4.0 K/uL Final   Monocytes Relative 01/24/2023 10  % Final   Monocytes Absolute 01/24/2023 0.7  0.1 - 1.0 K/uL Final   Eosinophils Relative 01/24/2023 6  % Final   Eosinophils Absolute 01/24/2023 0.4  0.0 - 0.5 K/uL Final   Basophils Relative 01/24/2023 1  % Final   Basophils Absolute 01/24/2023 0.0  0.0 - 0.1 K/uL Final   Immature Granulocytes 01/24/2023 0  % Final   Abs Immature Granulocytes 01/24/2023 0.02  0.00 - 0.07 K/uL Final   Performed at [facility], [address], [place], [place] [address]   Troponin I (High Sensitivity) 01/24/2023 4  <18 ng/L Final   Comment: (NOTE) Elevated high sensitivity troponin I (hsTnI) values and significant  changes across serial measurements may suggest ACS but many other  chronic and acute conditions  are known to elevate hsTnI results.  Refer to the "Links" section for chest pain algorithms and additional  guidance. Performed at [facility], [address], [place], [place] [address]    Total CK 01/24/2023 610 (H)  49 - 397 U/L Final   Performed at [facility], [address], [place], [place] [address]   Magnesium 01/24/2023 2.0  1.7 - 2.4 mg/dL Final   Performed at [facility], [address] [place], [place] [address]   Troponin I (High Sensitivity) 01/24/2023 4  <18 ng/L Final   Comment: (NOTE) Elevated high sensitivity troponin I (hsTnI) values and significant  changes across serial measurements may suggest ACS but many other  chronic and acute conditions are known to elevate hsTnI results.  Refer to the "Links" section for chest pain algorithms and additional  guidance. Performed at [facility], [address] [place], [place] [address]   Admission on 01/06/2023, Discharged on 01/06/2023  Component Date Value Ref Range Status   Sodium 01/06/2023 138  135 - 145 mmol/L Final   Potassium 01/06/2023 3.2 (L)  3.5 - 5.1 mmol/L Final   Chloride 01/06/2023 105  98 - 111 mmol/L Final   CO2 01/06/2023 22  22 - 32 mmol/L Final   Glucose, Bld 01/06/2023 143 (H)  70 - 99 mg/dL Final   Glucose reference range applies only to samples taken after fasting for at least 8 hours.   BUN 01/06/2023 <5 (L)  6 - 20 mg/dL Final   Creatinine, Ser 01/06/2023 0.68  0.61 - 1.24 mg/dL Final   Calcium 84/69/6295 8.6 (L)  8.9 - 10.3 mg/dL Final   GFR, Estimated 01/06/2023 >60  >60 mL/min Final   Comment: (NOTE) Calculated using the CKD-EPI Creatinine Equation (2021)    Anion gap 01/06/2023 11  5 - 15  Final   Performed at [facility], [address] [place], [place] [address]   Troponin I (High Sensitivity) 01/06/2023 2  <18 ng/L Final   Comment: (NOTE) Elevated high sensitivity troponin I (hsTnI) values and significant  changes across serial measurements may suggest ACS but many other  chronic and acute conditions are known to elevate hsTnI results.  Refer to the "Links" section for chest pain algorithms and additional  guidance. Performed at [facility], [address] [place], [place] [address]    WBC 01/06/2023 7.1  4.0 - 10.5 K/uL Final   RBC 01/06/2023 5.63  4.22 - 5.81 MIL/uL Final   Hemoglobin 01/06/2023 14.4  13.0 - 17.0 g/dL Final   HCT 02/72/5366 45.0  39.0 - 52.0 % Final   MCV 01/06/2023 79.9 (L)  80.0 - 100.0 fL Final   MCH 01/06/2023 25.6 (L)  26.0 - 34.0 pg Final   MCHC 01/06/2023 32.0  30.0 - 36.0 g/dL Final   RDW 44/09/4740 15.7 (H)  11.5 - 15.5 % Final   Platelets 01/06/2023 371  150 - 400 K/uL Final   nRBC 01/06/2023 0.0  0.0 - 0.2 % Final   Neutrophils Relative % 01/06/2023 67  % Final   Neutro Abs 01/06/2023 4.6  1.7 - 7.7 K/uL Final   Lymphocytes Relative 01/06/2023 25  % Final   Lymphs Abs 01/06/2023 1.8  0.7 - 4.0 K/uL Final   Monocytes Relative 01/06/2023 7  % Final   Monocytes Absolute 01/06/2023 0.5  0.1 - 1.0 K/uL Final   Eosinophils Relative 01/06/2023 1  % Final  Eosinophils Absolute 01/06/2023 0.1  0.0 - 0.5 K/uL Final   Basophils Relative 01/06/2023 0  % Final   Basophils Absolute 01/06/2023 0.0  0.0 - 0.1 K/uL Final   Immature Granulocytes 01/06/2023 0  % Final   Abs Immature Granulocytes 01/06/2023 0.02  0.00 - 0.07 K/uL Final   Performed at [facility], [address] [place], [place] [address]   Troponin I (High Sensitivity) 01/06/2023 2  <18 ng/L Final   Comment: (NOTE) Elevated high sensitivity troponin I (hsTnI) values and significant  changes across serial measurements may suggest ACS but many  other  chronic and acute conditions are known to elevate hsTnI results.  Refer to the "Links" section for chest pain algorithms and additional  guidance. Performed at [facility], [address] [place], [place] [address]     Allergies: Patient has no known allergies.  Medications:  Facility Ordered Medications  Medication   [COMPLETED] thiamine (VITAMIN B1) injection 100 mg   [COMPLETED] thiamine (VITAMIN B1) injection 100 mg   [START ON 02/19/2023] thiamine (VITAMIN B1) tablet 100 mg   multivitamin with minerals tablet 1 tablet   LORazepam (ATIVAN) tablet 1 mg   hydrOXYzine (ATARAX) tablet 25 mg   loperamide (IMODIUM) capsule 2-4 mg   ondansetron (ZOFRAN-ODT) disintegrating tablet 4 mg   LORazepam (ATIVAN) tablet 1 mg   Followed by   Melene Muller ON 02/19/2023] LORazepam (ATIVAN) tablet 1 mg   Followed by   Melene Muller ON 02/20/2023] LORazepam (ATIVAN) tablet 1 mg   Followed by   Melene Muller ON 02/22/2023] LORazepam (ATIVAN) tablet 1 mg   [START ON 02/19/2023] nicotine (NICODERM CQ - dosed in mg/24 hours) patch 14 mg    Long Term Goals: Improvement in symptoms so as ready for discharge  Short Term Goals: Patient will verbalize feelings in meetings with treatment team members., Patient will attend at least of 50% of the groups daily., Pt will complete the PHQ9 on admission, day 3 and discharge., Patient will participate in completing the [place] Suicide Severity Rating Scale, Patient will score a low risk of violence for 24 hours prior to discharge, and Patient will take medications as prescribed daily.  Medical Decision Making  Jashaun is a 32 year old male presenting for his second [facility] admission this month. He reports motivation for treatment this admission after getting his affairs in order, which prompted his discharge prior to receiving help during his last admission. He is not a safety concern at this time and denies sx of any psychiatric diagnosis. Based on mood sx reported in  [facility], it appears he has resolving substance-induced mood disorder that is improving as the substances metabolize. LCSW to assist with disposition planning.  #Alcohol Use Disorder CIWA Ativan protocol initiated (Due to mildly elevated LFTs): - lorazepam (Ativan) 1 mg 4 times daily x4 doses, 1 mg 3 times daily x3 doses, 1 mg 2 times daily x2 doses, 1 mg daily x1 dose -lorazepam (Ativan) 1 mg every 6 hours as needed for CIWA greater than 10; Hydroxyzine 25 mg for CIWA less than 10 -Multivitamin with minerals 1 tablet daily -Ondansetron disintegrating tablet 4 mg every 6 as needed/nausea or vomiting -Loperamide 2 to 4 mg oral as needed/diarrhea or loose stools -Thiamine injection 100 mg IM once -Thiamine tablet 100 mg daily  #Nicotine Use Disorder - NRT with Nicotine patch 14 mg daily.      Recommendations  Based on my evaluation the patient does not appear to have an emergency  medical condition.  Lamar Sprinkles, MD 02/18/23  3:53 PM

## 2023-02-19 MED ORDER — NICOTINE 14 MG/24HR TD PT24
14.0000 mg | MEDICATED_PATCH | Freq: Every day | TRANSDERMAL | Status: DC
  Filled 2023-02-19: qty 1

## 2023-02-19 MED ORDER — MELATONIN 3 MG PO TABS
3.0000 mg | ORAL_TABLET | Freq: Every day | ORAL | Status: DC
  Administered 2023-02-19 – 2023-02-22 (×4): 3 mg via ORAL
  Filled 2023-02-19 (×5): qty 1

## 2023-02-19 MED ORDER — TRAZODONE HCL 50 MG PO TABS: 50.0000 mg | ORAL_TABLET | Freq: Every evening | ORAL | Status: DC | PRN

## 2023-02-19 NOTE — ED Provider Notes (Cosign Needed Addendum)
Behavioral Health Progress Note  Date and Time: 02/19/2023 11:12 AM Name: [name] MRN:  [id number]  Subjective: Vitals within normal limits.  NAEON.  Glucose 168 H.  AST 86 H.  ALT 77 H.  Elevated lipids.  UDS positive for: Benzos, cocaine, EtOH (180 H).  EKG: QTc 437.  Possible left atrial enlargement.  On day 2/4 Ativan taper.  CIWAs: 0, 0.  PRNs: None.  On treatment team interview, patient was tired-appearing, flat, and minimally participatory.  Patient slept through breakfast, and it was difficult to wake him up for interview.  When asked what happened between time of last discharge and current presentation, patient said he "became unemployed" a couple of weeks ago.  Last drink of alcohol was Saturday 7/20, concurrent with cocaine use.  Patient denies benzodiazepine use without prescription.  Patient describes mood as "all right."  No new home meds allergies or outpatient treatment.  Patient superficially amenable to residential treatment 28-30 days.  [place] versus [employer].  When asked what series of events led to re-presentation, patient said "I do not know."  Patient denies having outstanding issues that he needs to deal with before transfer to residential substance use treatment.  Says that his truck is his only concern, and that it is currently in a safe place.  Diagnosis:  Final diagnoses:  Alcohol use disorder  Stimulant use disorder  Substance induced mood disorder (HCC)  Nicotine use disorder    Total Time spent with patient: 30 minutes  Past Psychiatric History: Per chart review, no previous psychiatric diagnoses.  Never seen a psychiatrist or therapist.  Not taking any psychiatric medications.  No past trials. Past Medical History: Per patient, had MI a few years ago years ago.  Related to cocaine use.  Concern for OSA.  On last admission 2 weeks ago, advised patient to seek sleep study. Family History: Multiple family members with schizophrenia. Family  Psychiatric  History: None pertinent. Social History: Lives with grandmother. 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 Married. Drinks 2-3 40 oz beers daily and sometimes hard alcohol. Uses cocaine approx 2 days per week.   Additional Social History:                         Sleep: Poor  Appetite:  NA\  Current Medications:  Current Facility-Administered Medications  Medication Dose Route Frequency Provider Last Rate Last Admin   hydrOXYzine (ATARAX) tablet 25 mg  25 mg Oral Q6H PRN Lamar Sprinkles, MD       loperamide (IMODIUM) capsule 2-4 mg  2-4 mg Oral PRN Lamar Sprinkles, MD       LORazepam (ATIVAN) tablet 1 mg  1 mg Oral Q6H PRN Lamar Sprinkles, MD       LORazepam (ATIVAN) tablet 1 mg  1 mg Oral TID Lamar Sprinkles, MD       Followed by   Melene Muller ON 02/20/2023] LORazepam (ATIVAN) tablet 1 mg  1 mg Oral BID Lamar Sprinkles, MD       Followed by   Melene Muller ON 02/22/2023] LORazepam (ATIVAN) tablet 1 mg  1 mg Oral Daily Cosby, Toni Amend, MD       melatonin tablet 3 mg  3 mg Oral QHS Tomie China, MD       multivitamin with minerals tablet 1 tablet  1 tablet Oral Daily Lamar Sprinkles, MD   1 tablet at 02/19/23 9629   nicotine (NICODERM CQ -  dosed in mg/24 hours) patch 14 mg  14 mg Transdermal Daily Tomie China, MD       ondansetron (ZOFRAN-ODT) disintegrating tablet 4 mg  4 mg Oral Q6H PRN Lamar Sprinkles, MD       thiamine (VITAMIN B1) tablet 100 mg  100 mg Oral Daily Lamar Sprinkles, MD   100 mg at 02/19/23 6962   traZODone (DESYREL) tablet 50 mg  50 mg Oral QHS PRN Tomie China, MD       No current outpatient medications on file.    Labs  Lab Results:  Admission on 02/18/2023, Discharged on 02/18/2023  Component Date Value Ref Range Status   WBC 02/18/2023 9.3  4.0 - 10.5 K/uL Final   RBC 02/18/2023 5.80  4.22 - 5.81 MIL/uL Final   Hemoglobin 02/18/2023 14.9  13.0 - 17.0 g/dL Final   HCT  95/28/4132 47.3  39.0 - 52.0 % Final   MCV 02/18/2023 81.6  80.0 - 100.0 fL Final   MCH 02/18/2023 25.7 (L)  26.0 - 34.0 pg Final   MCHC 02/18/2023 31.5  30.0 - 36.0 g/dL Final   RDW 44/07/270 17.6 (H)  11.5 - 15.5 % Final   Platelets 02/18/2023 342  150 - 400 K/uL Final   nRBC 02/18/2023 0.0  0.0 - 0.2 % Final   Neutrophils Relative % 02/18/2023 39  % Final   Neutro Abs 02/18/2023 3.6  1.7 - 7.7 K/uL Final   Lymphocytes Relative 02/18/2023 48  % Final   Lymphs Abs 02/18/2023 4.5 (H)  0.7 - 4.0 K/uL Final   Monocytes Relative 02/18/2023 6  % Final   Monocytes Absolute 02/18/2023 0.6  0.1 - 1.0 K/uL Final   Eosinophils Relative 02/18/2023 6  % Final   Eosinophils Absolute 02/18/2023 0.6 (H)  0.0 - 0.5 K/uL Final   Basophils Relative 02/18/2023 1  % Final   Basophils Absolute 02/18/2023 0.1  0.0 - 0.1 K/uL Final   Immature Granulocytes 02/18/2023 0  % Final   Abs Immature Granulocytes 02/18/2023 0.03  0.00 - 0.07 K/uL Final   Performed at [facility], [address] [place], [place] [address]   Sodium 02/18/2023 137  135 - 145 mmol/L Final   Potassium 02/18/2023 4.3  3.5 - 5.1 mmol/L Final   Chloride 02/18/2023 105  98 - 111 mmol/L Final   CO2 02/18/2023 20 (L)  22 - 32 mmol/L Final   Glucose, Bld 02/18/2023 168 (H)  70 - 99 mg/dL Final   Glucose reference range applies only to samples taken after fasting for at least 8 hours.   BUN 02/18/2023 <5 (L)  6 - 20 mg/dL Final   Creatinine, Ser 02/18/2023 0.80  0.61 - 1.24 mg/dL Final   Calcium 40/34/7425 9.1  8.9 - 10.3 mg/dL Final   Total Protein 95/63/8756 7.0  6.5 - 8.1 g/dL Final   Albumin 43/32/9518 3.8  3.5 - 5.0 g/dL Final   AST 84/16/6063 86 (H)  15 - 41 U/L Final   ALT 02/18/2023 77 (H)  0 - 44 U/L Final   Alkaline Phosphatase 02/18/2023 54  38 - 126 U/L Final   Total Bilirubin 02/18/2023 1.1  0.3 - 1.2 mg/dL Final   GFR, Estimated 02/18/2023 >60  >60 mL/min Final   Comment: (NOTE) Calculated using the CKD-EPI  Creatinine Equation (2021)    Anion gap 02/18/2023 12  5 - 15 Final   Performed at [facility], [address], [place], [place] [address]  Cholesterol 02/18/2023 224 (H)  0 - 200 mg/dL Final   Triglycerides 40/98/1191 315 (H)  <150 mg/dL Final   HDL 47/82/9562 37 (L)  >40 mg/dL Final   Total CHOL/HDL Ratio 02/18/2023 6.1  RATIO Final   VLDL 02/18/2023 63 (H)  0 - 40 mg/dL Final   LDL Cholesterol 02/18/2023 124 (H)  0 - 99 mg/dL Final   Comment:        Total Cholesterol/HDL:CHD Risk Coronary Heart Disease Risk Table                     Men   Women  1/2 Average Risk   3.4   3.3  Average Risk       5.0   4.4  2 X Average Risk   9.6   7.1  3 X Average Risk  23.4   11.0        Use the calculated Patient Ratio above and the CHD Risk Table to determine the patient's CHD Risk.        ATP III CLASSIFICATION (LDL):  <100     mg/dL   Optimal  130-865  mg/dL   Near or Above                    Optimal  130-159  mg/dL   Borderline  784-696  mg/dL   High  >295     mg/dL   Very High Performed at [facility], [address], [place], [place] [address]    POC Amphetamine UR 02/18/2023 None Detected  NONE DETECTED (Cut Off Level 1000 ng/mL) Final   POC Secobarbital (BAR) 02/18/2023 None Detected  NONE DETECTED (Cut Off Level 300 ng/mL) Final   POC Buprenorphine (BUP) 02/18/2023 None Detected  NONE DETECTED (Cut Off Level 10 ng/mL) Final   POC Oxazepam (BZO) 02/18/2023 Positive (A)  NONE DETECTED (Cut Off Level 300 ng/mL) Final   POC Cocaine UR 02/18/2023 Positive (A)  NONE DETECTED (Cut Off Level 300 ng/mL) Final   POC Methamphetamine UR 02/18/2023 None Detected  NONE DETECTED (Cut Off Level 1000 ng/mL) Final   POC Morphine 02/18/2023 None Detected  NONE DETECTED (Cut Off Level 300 ng/mL) Final   POC Methadone UR 02/18/2023 None Detected  NONE DETECTED (Cut Off Level 300 ng/mL) Final   POC Oxycodone UR 02/18/2023 None Detected  NONE DETECTED (Cut Off Level 100 ng/mL) Final   POC  Marijuana UR 02/18/2023 None Detected  NONE DETECTED (Cut Off Level 50 ng/mL) Final   Alcohol, Ethyl (B) 02/18/2023 180 (H)  <10 mg/dL Final   Comment: (NOTE) Lowest detectable limit for serum alcohol is 10 mg/dL.  For medical purposes only. Performed at [facility], [address], [place], [place] [address]   Admission on 01/31/2023, Discharged on 01/31/2023  Component Date Value Ref Range Status   WBC 01/31/2023 7.4  4.0 - 10.5 K/uL Final   RBC 01/31/2023 6.12 (H)  4.22 - 5.81 MIL/uL Final   Hemoglobin 01/31/2023 15.7  13.0 - 17.0 g/dL Final   HCT 02/72/5366 49.7  39.0 - 52.0 % Final   MCV 01/31/2023 81.2  80.0 - 100.0 fL Final   MCH 01/31/2023 25.7 (L)  26.0 - 34.0 pg Final   MCHC 01/31/2023 31.6  30.0 - 36.0 g/dL Final   RDW 44/09/4740 14.8  11.5 - 15.5 % Final   Platelets 01/31/2023 343  150 - 400 K/uL Final   nRBC 01/31/2023 0.0  0.0 -  0.2 % Final   Neutrophils Relative % 01/31/2023 52  % Final   Neutro Abs 01/31/2023 3.8  1.7 - 7.7 K/uL Final   Lymphocytes Relative 01/31/2023 37  % Final   Lymphs Abs 01/31/2023 2.8  0.7 - 4.0 K/uL Final   Monocytes Relative 01/31/2023 7  % Final   Monocytes Absolute 01/31/2023 0.5  0.1 - 1.0 K/uL Final   Eosinophils Relative 01/31/2023 3  % Final   Eosinophils Absolute 01/31/2023 0.2  0.0 - 0.5 K/uL Final   Basophils Relative 01/31/2023 1  % Final   Basophils Absolute 01/31/2023 0.1  0.0 - 0.1 K/uL Final   Immature Granulocytes 01/31/2023 0  % Final   Abs Immature Granulocytes 01/31/2023 0.02  0.00 - 0.07 K/uL Final   Performed at [facility], [address], [place], [place] [address]   Sodium 01/31/2023 136  135 - 145 mmol/L Final   Potassium 01/31/2023 3.7  3.5 - 5.1 mmol/L Final   Chloride 01/31/2023 102  98 - 111 mmol/L Final   CO2 01/31/2023 20 (L)  22 - 32 mmol/L Final   Glucose, Bld 01/31/2023 149 (H)  70 - 99 mg/dL Final   Glucose reference range applies only to samples taken after fasting for at least 8 hours.    BUN 01/31/2023 <5 (L)  6 - 20 mg/dL Final   Creatinine, Ser 01/31/2023 0.76  0.61 - 1.24 mg/dL Final   Calcium 60/45/4098 9.5  8.9 - 10.3 mg/dL Final   Total Protein 11/91/4782 7.2  6.5 - 8.1 g/dL Final   Albumin 95/62/1308 3.7  3.5 - 5.0 g/dL Final   AST 65/78/4696 38  15 - 41 U/L Final   ALT 01/31/2023 48 (H)  0 - 44 U/L Final   Alkaline Phosphatase 01/31/2023 47  38 - 126 U/L Final   Total Bilirubin 01/31/2023 0.5  0.3 - 1.2 mg/dL Final   GFR, Estimated 01/31/2023 >60  >60 mL/min Final   Comment: (NOTE) Calculated using the CKD-EPI Creatinine Equation (2021)    Anion gap 01/31/2023 14  5 - 15 Final   Performed at [facility], [address], [place], [place] [address]   Alcohol, Ethyl (B) 01/31/2023 188 (H)  <10 mg/dL Final   Comment: (NOTE) Lowest detectable limit for serum alcohol is 10 mg/dL.  For medical purposes only. Performed at [facility], [address], [place], [place] [address]    Cholesterol 01/31/2023 218 (H)  0 - 200 mg/dL Final   Triglycerides 40/04/2724 192 (H)  <150 mg/dL Final   HDL 36/64/4034 33 (L)  >40 mg/dL Final   Total CHOL/HDL Ratio 01/31/2023 6.6  RATIO Final   VLDL 01/31/2023 38  0 - 40 mg/dL Final   LDL Cholesterol 01/31/2023 147 (H)  0 - 99 mg/dL Final   Comment:        Total Cholesterol/HDL:CHD Risk Coronary Heart Disease Risk Table                     Men   Women  1/2 Average Risk   3.4   3.3  Average Risk       5.0   4.4  2 X Average Risk   9.6   7.1  3 X Average Risk  23.4   11.0        Use the calculated Patient Ratio above and the CHD Risk Table to determine the patient's CHD Risk.  ATP III CLASSIFICATION (LDL):  <100     mg/dL   Optimal  161-096  mg/dL   Near or Above                    Optimal  130-159  mg/dL   Borderline  045-409  mg/dL   High  >811     mg/dL   Very High Performed at [facility], [address] [place], [place] [address]    TSH 01/31/2023 0.993  0.350 - 4.500 uIU/mL Final    Comment: Performed by a 3rd Generation assay with a functional sensitivity of <=0.01 uIU/mL. Performed at [facility], [address] [place], [place] [address]    POC Amphetamine UR 01/31/2023 None Detected  NONE DETECTED (Cut Off Level 1000 ng/mL) Final   POC Secobarbital (BAR) 01/31/2023 None Detected  NONE DETECTED (Cut Off Level 300 ng/mL) Final   POC Buprenorphine (BUP) 01/31/2023 None Detected  NONE DETECTED (Cut Off Level 10 ng/mL) Final   POC Oxazepam (BZO) 01/31/2023 None Detected  NONE DETECTED (Cut Off Level 300 ng/mL) Final   POC Cocaine UR 01/31/2023 None Detected  NONE DETECTED (Cut Off Level 300 ng/mL) Final   POC Methamphetamine UR 01/31/2023 None Detected  NONE DETECTED (Cut Off Level 1000 ng/mL) Final   POC Morphine 01/31/2023 None Detected  NONE DETECTED (Cut Off Level 300 ng/mL) Final   POC Methadone UR 01/31/2023 None Detected  NONE DETECTED (Cut Off Level 300 ng/mL) Final   POC Oxycodone UR 01/31/2023 None Detected  NONE DETECTED (Cut Off Level 100 ng/mL) Final   POC Marijuana UR 01/31/2023 None Detected  NONE DETECTED (Cut Off Level 50 ng/mL) Final  Admission on 01/24/2023, Discharged on 01/24/2023  Component Date Value Ref Range Status   Sodium 01/24/2023 135  135 - 145 mmol/L Final   Potassium 01/24/2023 3.8  3.5 - 5.1 mmol/L Final   Chloride 01/24/2023 104  98 - 111 mmol/L Final   CO2 01/24/2023 20 (L)  22 - 32 mmol/L Final   Glucose, Bld 01/24/2023 134 (H)  70 - 99 mg/dL Final   Glucose reference range applies only to samples taken after fasting for at least 8 hours.   BUN 01/24/2023 <5 (L)  6 - 20 mg/dL Final   Creatinine, Ser 01/24/2023 0.73  0.61 - 1.24 mg/dL Final   Calcium 13/02/6577 8.9  8.9 - 10.3 mg/dL Final   Total Protein 46/96/2952 7.5  6.5 - 8.1 g/dL Final   Albumin 84/13/2440 4.0  3.5 - 5.0 g/dL Final   AST 05/27/2535 47 (H)  15 - 41 U/L Final   ALT 01/24/2023 57 (H)  0 - 44 U/L Final   Alkaline Phosphatase 01/24/2023 52  38 - 126 U/L Final    Total Bilirubin 01/24/2023 0.8  0.3 - 1.2 mg/dL Final   GFR, Estimated 01/24/2023 >60  >60 mL/min Final   Comment: (NOTE) Calculated using the CKD-EPI Creatinine Equation (2021)    Anion gap 01/24/2023 11  5 - 15 Final   Performed at [facility], [address], [place], [place] [address]   WBC 01/24/2023 6.8  4.0 - 10.5 K/uL Final   RBC 01/24/2023 5.97 (H)  4.22 - 5.81 MIL/uL Final   Hemoglobin 01/24/2023 15.0  13.0 - 17.0 g/dL Final   HCT 47/42/5956 47.8  39.0 - 52.0 % Final   MCV 01/24/2023 80.1  80.0 - 100.0 fL Final   MCH 01/24/2023 25.1 (L)  26.0 - 34.0 pg Final   MCHC 01/24/2023 31.4  30.0 - 36.0 g/dL Final   RDW 96/10/5407 15.4  11.5 - 15.5 % Final   Platelets 01/24/2023 331  150 - 400 K/uL Final   nRBC 01/24/2023 0.0  0.0 - 0.2 % Final   Neutrophils Relative % 01/24/2023 48  % Final   Neutro Abs 01/24/2023 3.3  1.7 - 7.7 K/uL Final   Lymphocytes Relative 01/24/2023 35  % Final   Lymphs Abs 01/24/2023 2.3  0.7 - 4.0 K/uL Final   Monocytes Relative 01/24/2023 10  % Final   Monocytes Absolute 01/24/2023 0.7  0.1 - 1.0 K/uL Final   Eosinophils Relative 01/24/2023 6  % Final   Eosinophils Absolute 01/24/2023 0.4  0.0 - 0.5 K/uL Final   Basophils Relative 01/24/2023 1  % Final   Basophils Absolute 01/24/2023 0.0  0.0 - 0.1 K/uL Final   Immature Granulocytes 01/24/2023 0  % Final   Abs Immature Granulocytes 01/24/2023 0.02  0.00 - 0.07 K/uL Final   Performed at [facility], [address] [place], [place] [address]   Troponin I (High Sensitivity) 01/24/2023 4  <18 ng/L Final   Comment: (NOTE) Elevated high sensitivity troponin I (hsTnI) values and significant  changes across serial measurements may suggest ACS but many other  chronic and acute conditions are known to elevate hsTnI results.  Refer to the "Links" section for chest pain algorithms and additional  guidance. Performed at [facility], [address], [place], [place] [address]    Total CK  01/24/2023 610 (H)  49 - 397 U/L Final   Performed at [facility], [address] [place], [place] [address]   Magnesium 01/24/2023 2.0  1.7 - 2.4 mg/dL Final   Performed at [facility], [address], [place], [place] [address]   Troponin I (High Sensitivity) 01/24/2023 4  <18 ng/L Final   Comment: (NOTE) Elevated high sensitivity troponin I (hsTnI) values and significant  changes across serial measurements may suggest ACS but many other  chronic and acute conditions are known to elevate hsTnI results.  Refer to the "Links" section for chest pain algorithms and additional  guidance. Performed at [facility], [address], [place], [place] [address]   Admission on 01/06/2023, Discharged on 01/06/2023  Component Date Value Ref Range Status   Sodium 01/06/2023 138  135 - 145 mmol/L Final   Potassium 01/06/2023 3.2 (L)  3.5 - 5.1 mmol/L Final   Chloride 01/06/2023 105  98 - 111 mmol/L Final   CO2 01/06/2023 22  22 - 32 mmol/L Final   Glucose, Bld 01/06/2023 143 (H)  70 - 99 mg/dL Final   Glucose reference range applies only to samples taken after fasting for at least 8 hours.   BUN 01/06/2023 <5 (L)  6 - 20 mg/dL Final   Creatinine, Ser 01/06/2023 0.68  0.61 - 1.24 mg/dL Final   Calcium 29/52/8413 8.6 (L)  8.9 - 10.3 mg/dL Final   GFR, Estimated 01/06/2023 >60  >60 mL/min Final   Comment: (NOTE) Calculated using the CKD-EPI Creatinine Equation (2021)    Anion gap 01/06/2023 11  5 - 15 Final   Performed at [facility], [address] [place], [place] [address]   Troponin I (High Sensitivity) 01/06/2023 2  <18 ng/L Final   Comment: (NOTE) Elevated high sensitivity troponin I (hsTnI) values and significant  changes across serial  measurements may suggest ACS but many other  chronic and acute conditions are known to elevate hsTnI results.  Refer to the "Links" section for chest pain algorithms and additional  guidance. Performed at [facility], [address] [place], [place] [address]    WBC 01/06/2023 7.1  4.0 - 10.5 K/uL Final   RBC 01/06/2023 5.63  4.22 - 5.81 MIL/uL Final   Hemoglobin 01/06/2023 14.4  13.0 - 17.0 g/dL Final   HCT 60/45/4098 45.0  39.0 - 52.0 % Final   MCV 01/06/2023 79.9 (L)  80.0 - 100.0 fL Final   MCH 01/06/2023 25.6 (L)  26.0 - 34.0 pg Final   MCHC 01/06/2023 32.0  30.0 - 36.0 g/dL Final   RDW 11/91/4782 15.7 (H)  11.5 - 15.5 % Final   Platelets 01/06/2023 371  150 - 400 K/uL Final   nRBC 01/06/2023 0.0  0.0 - 0.2 % Final   Neutrophils Relative % 01/06/2023 67  % Final   Neutro Abs 01/06/2023 4.6  1.7 - 7.7 K/uL Final   Lymphocytes Relative 01/06/2023 25  % Final   Lymphs Abs 01/06/2023 1.8  0.7 - 4.0 K/uL Final   Monocytes Relative 01/06/2023 7  % Final   Monocytes Absolute 01/06/2023 0.5  0.1 - 1.0 K/uL Final   Eosinophils Relative 01/06/2023 1  % Final   Eosinophils Absolute 01/06/2023 0.1  0.0 - 0.5 K/uL Final   Basophils Relative 01/06/2023 0  % Final   Basophils Absolute 01/06/2023 0.0  0.0 - 0.1 K/uL Final   Immature Granulocytes 01/06/2023 0  % Final   Abs Immature Granulocytes 01/06/2023 0.02  0.00 - 0.07 K/uL Final   Performed at [facility], [address] [place], [place] [address]   Troponin I (High Sensitivity) 01/06/2023 2  <18 ng/L Final   Comment: (NOTE) Elevated high sensitivity troponin I (hsTnI) values and significant  changes across serial measurements may suggest ACS but many other  chronic and acute conditions are known to elevate hsTnI results.  Refer to the "Links" section for chest pain algorithms and additional  guidance. Performed at [facility], [address] [place], [place] [address]     Blood Alcohol level:  Lab Results  Component Value Date   ETH 180 (H) 02/18/2023   ETH 188 (H) 01/31/2023    Metabolic Disorder Labs: No results found for: "HGBA1C", "MPG" No results found for: "PROLACTIN" Lab Results  Component  Value Date   CHOL 224 (H) 02/18/2023   TRIG 315 (H) 02/18/2023   HDL 37 (L) 02/18/2023   CHOLHDL 6.1 02/18/2023   VLDL 63 (H) 02/18/2023   LDLCALC 124 (H) 02/18/2023   LDLCALC 147 (H) 01/31/2023    Therapeutic Lab Levels: No results found for: "LITHIUM" No results found for: "VALPROATE" No results found for: "CBMZ"  Physical Findings   AUDIT    Flowsheet Row ED from 01/31/2023 in [facility]  Alcohol Use Disorder Identification Test Final Score (AUDIT) 29      PHQ2-9    Flowsheet Row ED from 01/31/2023 in [facility] Most recent reading at 01/31/2023  3:56 AM ED from 01/31/2023 in [facility] Most recent reading at 01/31/2023  3:00 AM  PHQ-2 Total Score 2 2  PHQ-9 Total Score 9 9      Flowsheet Row ED from 02/18/2023 in [facility] Most recent reading at 02/18/2023  12:04 PM ED from 02/18/2023 in [facility] Most recent reading at 02/18/2023  2:17 AM ED from 02/17/2023 in [facility] Most recent reading at 02/17/2023  5:46 PM  C-SSRS RISK CATEGORY No Risk No Risk No Risk        Musculoskeletal  Strength & Muscle Tone: within normal limits Gait & Station: normal Patient leans: N/A  Psychiatric Specialty Exam  Presentation  General Appearance:  Disheveled  Eye Contact: Minimal  Speech: Slow  Speech Volume: Normal  Handedness: Right   Mood and Affect  Mood: Depressed  Affect: Congruent   Thought Process  Thought Processes: Coherent  Descriptions of Associations:Intact  Orientation:Full (Time, Place and Person)  Thought Content:Logical  Diagnosis of Schizophrenia or Schizoaffective disorder in past: No    Hallucinations:Hallucinations: None Description of Auditory Hallucinations: denies Description of Visual Hallucinations: denies  Ideas of Reference:None  Suicidal  Thoughts:Suicidal Thoughts: No  Homicidal Thoughts:Homicidal Thoughts: No   Sensorium  Memory: Immediate Good; Recent Good; Remote Good  Judgment: Poor  Insight: Poor   Executive Functions  Concentration: Fair  Attention Span: Fair  Recall: Fair  Fund of Knowledge: Fair  Language: Fair   Psychomotor Activity  Psychomotor Activity: Psychomotor Activity: Normal   Assets  Assets: Desire for Improvement; Resilience; Physical Health   Sleep  Sleep: Sleep: Poor Number of Hours of Sleep: 3   Nutritional Assessment (For OBS and FBC admissions only) Has the patient had a weight loss or gain of 10 pounds or more in the last 3 months?: No Has the patient had a decrease in food intake/or appetite?: No Does the patient have dental problems?: No Does the patient have eating habits or behaviors that may be indicators of an eating disorder including binging or inducing vomiting?: No Has the patient recently lost weight without trying?: 0 Has the patient been eating poorly because of a decreased appetite?: 0 Malnutrition Screening Tool Score: 0    Physical Exam  Physical Exam Vitals reviewed.  Constitutional:      General: He is not in acute distress.    Appearance: He is obese.     Comments: Appears tired  HENT:     Head: Normocephalic and atraumatic.  Eyes:     Pupils: Pupils are equal, round, and reactive to light.  Pulmonary:     Effort: Pulmonary effort is normal.  Abdominal:     General: There is distension.     Palpations: Abdomen is soft.  Musculoskeletal:        General: Normal range of motion.  Skin:    General: Skin is warm and dry.  Neurological:     Mental Status: He is oriented to person, place, and time. Mental status is at baseline.  Psychiatric:        Mood and Affect: Mood normal.        Behavior: Behavior normal.    Review of Systems  Psychiatric/Behavioral:  Positive for depression and substance abuse. Negative for  hallucinations and suicidal ideas. The patient has insomnia. The patient is not nervous/anxious.   All other systems reviewed and are negative.  Blood pressure 120/71, pulse 74, temperature 98.7 F (37.1 C), temperature source Tympanic, resp. rate 18, SpO2 99%. There is no height or weight on file to calculate BMI.  Treatment Plan Summary:  Assessment:  Patient is a 32 year old male with a past psychiatric history of alcohol use disorder, severe who re-presents seeking alcohol detox.  Patient was admitted to [facility]  2 weeks ago under similar circumstances.  At that time, patient initially was open to residential substance use treatment, but left with outpatient resources after he began to feel confined.  Patient insight and judgment remains poor.  Per chart review, it appears that patient sought discharge late last night, and presented to [facility] only because his wife would not allow him back in the house.  Believes that residential substance use treatment is best option for him, unclear whether or not insight and judgment are sufficient to follow through with that.  Social work will send out applications today.  On day 2 of 4 Ativan taper in setting of elevated LFTs, CIWAs of 0.  2 days since last drink.  Positive benzodiazepines and UDS likely secondary to start of Ativan taper prior to test.  Would prefer to go to [facility] versus [place] residential treatment over [place].  Will attempt to cultivate insight over course of current stay.  Glucose of 168 and elevated lipids concerning for metabolic disease.  A1c ordered.  Added trazodone 50 mg as needed and melatonin 3 mg scheduled, as this is what patient was on when he was last here.  Again, some suspicion for undiagnosed OSA.  In the setting of concurrent cocaine use and previous MI, ongoing concern for acute cardiovascular disease over short-medium term.  Counseled patient extensively to follow up with outpatient medical provider over the last stay.  Denied SI, HI, AVH.  Plan:  # Alcohol use disorder, severe: ongoing.    # Stimulant use disorder, severe, cocaine type: ongoing. # CIWA Ativan protocol: ongoing.  Day 2 of 4.  Last CIWAs 0, 0.  Last drink Saturday 7/20. - Continue lorazepam (Ativan) 1 mg 4 times daily x4 doses, 1 mg 3 times daily x3 doses, 1 mg 2 times daily x2 doses, 1 mg daily x1 dose - Continue lorazepam (Ativan) 1 mg every 6 hours as needed for CIWA greater than 10; Hydroxyzine 25 mg for CIWA less than 10 - Continue multivitamin with minerals 1 tablet daily. - Continue ondansetron disintegrating tablet 4 mg every 6 as needed/nausea or vomiting. - Continue loperamide 2 to 4 mg oral as needed/diarrhea or loose stools. - Given thiamine injection 100 mg IM once. - Continue thiamine tablet 500 mg Q8 hours. - Encourage patient to participate in therapeutic milieu while on unit. - Will consider acamprosate to target alcohol cravings, in setting of elevated LFTs.   # Recent history of trauma: Ongoing. Collateral call with wife indicates of substance use is deeply tied to recent traumatic experiences involving the death of multiple family members. - Will discuss options for outpatient psychiatric referral, and close medical follow-up.   # Tobacco use disorder: Ongoing. Patient vapes nicotine at home. - Continue NicoDerm patch 14 mg.  Remove at night.   # History of myocardial infarction: ongoing.  EKG 7/21 indicates left atrial enlargement, no acute concern for MI.  No change from 7/3. # Concern for OSA: ongoing.  BMI 43.  Patient snores loudly on the unit. # Insomnia: Resolving. # Hypertension: Resolved.  120/71 as of 7/22. # Concern for pre-diabetes: ongoing.  Glucose 168.  Elevated lipids. - Order A1c blood draw. - Begin melatonin 3 mg nightly for insomnia. - Begin trazodone 50 mg as needed nightly. - Will continue to monitor blood pressure and consider addition of amlodipine versus ARB.   # Disposition: Pending,  likely LOS 3-5 days. Social work to explore options for residential treatment facilities after discharge.  James Lafalce,  MD 02/19/2023 11:12 AM

## 2023-02-19 NOTE — Group Note (Signed)
Group Topic: Fears and Unhealthy Coping Skills  Group Date: 02/19/2023 Start Time: 1220 End Time: 1240 Facilitators: Jenean Lindau, RN  Department: [facility]  Number of Participants: 3  Group Focus: abuse issues Treatment Modality:  Behavior Modification Therapy Interventions utilized were exploration Purpose: enhance coping skills  Name: [name] Date of Birth: 1990-09-10  MR: [id number]    Level of Participation: minimal Quality of Participation: attentive Interactions with others: gave feedback Mood/Affect: appropriate Triggers (if applicable):   Cognition: concrete Progress: Gaining insight Response:   Plan: follow-up needed  Patients Problems:  Patient Active Problem List   Diagnosis Date Noted   Stimulant use disorder 02/18/2023   Substance induced mood disorder (HCC) 02/18/2023   Nicotine use disorder 02/18/2023   Alcohol use disorder 01/31/2023

## 2023-02-19 NOTE — ED Notes (Signed)
Patient is sleeping. Respirations equal and unlabored, skin warm and dry. No change in assessment or acuity. Routine safety checks conducted according to facility protocol. Will continue to monitor for safety.   

## 2023-02-19 NOTE — ED Notes (Signed)
Patient passively refused to attend group session after multiple attempts of encouragement by staff. Patient continued to sleep in room.

## 2023-02-19 NOTE — Group Note (Signed)
Group Topic: Recovery Basics with AA Group Group Date: 02/19/2023 Start Time: 1015 End Time: 1100 Facilitators: Vonzell Schlatter B  Department: [facility]  Number of Participants: 3  Group Focus: substance abuse education Treatment Modality:  Psychoeducation Interventions utilized were reality testing and support Purpose: relapse prevention strategies and trigger / craving management  Name: [name] Date of Birth: Dec 09, 1990  MR: [id number]    Level of Participation: moderate Quality of Participation: attentive and cooperative Interactions with others: gave feedback Mood/Affect: positive Triggers (if applicable): n/a Cognition: coherent/clear Progress: Moderate Response: n/a Plan: follow-up needed  Patients Problems:  Patient Active Problem List   Diagnosis Date Noted   Stimulant use disorder 02/18/2023   Substance induced mood disorder (HCC) 02/18/2023   Nicotine use disorder 02/18/2023   Alcohol use disorder 01/31/2023

## 2023-02-19 NOTE — ED Notes (Signed)
Patient remains asleep in bed snoring loudly.  He did not want lunch but wants it "later."

## 2023-02-19 NOTE — ED Notes (Signed)
Patient remains asleep in bed snoring loudly.  He remains disheveled and malodorous with poor hygiene.  Patients motivation is poor.  No withdrawal at this time and tolerating ativan taper.  CIWA = 0

## 2023-02-19 NOTE — Tx Team (Signed)
Patient is known to this provider. LCSW, MD, and Resident met with patient to assess current mood, affect, physical state, and inquire about needs/goals while here in [facility] and after discharge. Per admission note, "[name] is a 32 year old male presenting voluntarily to [facility] to detox from alcohol.  Patient reports that he drinks daily to 2- 3  40 ounce beers of alcohol and uses cocaine. Pt reports, using Cocaine (not sure when he last used or how much he used) and drinking a beer 30 minutes before presenting to [facility]. Pt reports, he drinks alcohol every other day. [facility]-02/02/2023. Pt denies, being linked to outpatient resources. Per chart pt left AMA because it reminded him of jail. Clinician asked the pt if he followed up with [facility] as he was accepted to their program and was to present there on Monday 02/05/2023. Pt reports, he did not go because it's close to people he knows". Patient reports his current goal is to seek residential placement for himself. Patient reports he would like to consider the [facility] again. Patient has provided permission for LCSW to send his clinicals out to the agencies within the surrounding area. LCSW will follow up to provide updates as received.   Referrals will be sent to [facility], [facility], [facility] (via phone when able), and [facility] for review. LCSW will provide updates once received.   Fernande Boyden, LCSW Clinical Social Worker [facility] Ph: [phone]

## 2023-02-19 NOTE — ED Notes (Signed)
Patient observed/assessed at bedside lying in bed asleep. Patient alert and oriented to self and location. Affect is flat. Patient denies pain and anxiety. He denies A/V/H. He denies having any thoughts/plan of self harm and harm towards others. Meal, Fluid and snack offered. Patient states that appetite has been good throughout the day. Verbalizes no further complaints at this time. Will continue to monitor for safety and provide support

## 2023-02-20 NOTE — ED Notes (Signed)
Patient observed/assessed in room in bed appearing in no immediate distress resting peacefully. Q15 minute checks continued by MHT and nursing staff. Will continue to monitor and support. 

## 2023-02-20 NOTE — Progress Notes (Signed)
Pt is awake, alert and oriented X4. Pt did not voice any complaints of pain or discomfort. No signs of acute distress noted. Administered scheduled meds with no issue. Pt denies current SI/HI/AVH, plan or intent. Staff will monitor for pt's safety. 

## 2023-02-20 NOTE — Progress Notes (Signed)
Pt's CIWA was 2. °

## 2023-02-20 NOTE — Progress Notes (Signed)
Pt's CIWA was 3

## 2023-02-20 NOTE — ED Provider Notes (Signed)
Behavioral Health Progress Note  Date and Time: 02/20/2023 8:43 AM Name: [name] MRN:  [id number]  Subjective: Vitals within normal limits.  NAEON.  Awaiting A1c.  Day 3 of 4 Ativan taper.  CIWAs: 0, 0.  No PRNs.  No refusals.  Slept all day.  On interview, patient was laying under blanket in room.  Patient spoke in short, clipped sentences and did not elaborate when asked open-ended questions.  Described mood as "down."  Said he was restless and could not sleep all night, tossing and turning.  Asked for more pillows and more blankets.  Denied SI, HI, and AVH.  Denied withdrawal symptoms.  No new medical concerns.  Reviewed options for residential substance use treatment -- [facility] [address], [address], [address], and [facility].  Patient gave permission to contact wife, asked [place] to reach out to her later in day as she works in the mornings.  On reinterview, patient flagged writer down to talk with him.  He said that he did not want to "mess it up" this time.  Expressed surprise at how quickly he return to alcohol and cocaine use after being discharged from [facility], although there were extenuating circumstances at that time (his truck was at a friend's house.)  Patient expressed renewed interest in residential substance use treatment.  On collateral interview with wife Khy Pitre [phone]), she reports no changes from previous behavior.  Started engaging in substance use after [facility] discharge.  Denies presence of firearms at home.  Does not think would be a good idea for patient to discharge home as, his behavior "escalates".  She is "hoping for effectiveness this time."  Mentioned to wife that husband appears somewhat more engaged in treatment as of this afternoon, and that his information has been faxed out to multiple substance use facilities.  Will call tomorrow with updates.   Diagnosis:  Final diagnoses:  Alcohol use disorder  Stimulant use disorder  Substance induced mood  disorder (HCC)  Nicotine use disorder    Total Time spent with patient: 15 minutes  Past Psychiatric History: Per chart review, no previous psychiatric diagnoses.  Never seen a psychiatrist or therapist.  Not taking any psychiatric medications.  No past trials. Past Medical History: Per patient, had MI a few years ago years ago.  Related to cocaine use.  Concern for OSA.  On last admission 2 weeks ago, advised patient to seek sleep study. Family History: Multiple family members with schizophrenia. Family Psychiatric  History: None pertinent. Social History: Lives with grandmother. 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 Married. Drinks 2-3 40 oz beers daily and sometimes hard alcohol. Uses cocaine approx 2 days per week.   Additional Social History:                         Sleep: Poor  Appetite:  NA\  Current Medications:  Current Facility-Administered Medications  Medication Dose Route Frequency Provider Last Rate Last Admin   hydrOXYzine (ATARAX) tablet 25 mg  25 mg Oral Q6H PRN Lamar Sprinkles, MD       loperamide (IMODIUM) capsule 2-4 mg  2-4 mg Oral PRN Lamar Sprinkles, MD       LORazepam (ATIVAN) tablet 1 mg  1 mg Oral Q6H PRN Lamar Sprinkles, MD       LORazepam (ATIVAN) tablet 1 mg  1 mg Oral TID Lamar Sprinkles, MD   1 mg at 02/19/23 2132  Followed by   LORazepam (ATIVAN) tablet 1 mg  1 mg Oral BID Lamar Sprinkles, MD       Followed by   Melene Muller ON 02/22/2023] LORazepam (ATIVAN) tablet 1 mg  1 mg Oral Daily Lamar Sprinkles, MD       melatonin tablet 3 mg  3 mg Oral QHS Tomie China, MD   3 mg at 02/19/23 2132   multivitamin with minerals tablet 1 tablet  1 tablet Oral Daily Lamar Sprinkles, MD   1 tablet at 02/19/23 0928   ondansetron (ZOFRAN-ODT) disintegrating tablet 4 mg  4 mg Oral Q6H PRN Lamar Sprinkles, MD       thiamine (VITAMIN B1) tablet 100 mg  100 mg Oral Daily Lamar Sprinkles, MD   100 mg  at 02/19/23 1610   traZODone (DESYREL) tablet 50 mg  50 mg Oral QHS PRN Tomie China, MD       No current outpatient medications on file.    Labs  Lab Results:  Admission on 02/18/2023, Discharged on 02/18/2023  Component Date Value Ref Range Status   WBC 02/18/2023 9.3  4.0 - 10.5 K/uL Final   RBC 02/18/2023 5.80  4.22 - 5.81 MIL/uL Final   Hemoglobin 02/18/2023 14.9  13.0 - 17.0 g/dL Final   HCT 96/10/5407 47.3  39.0 - 52.0 % Final   MCV 02/18/2023 81.6  80.0 - 100.0 fL Final   MCH 02/18/2023 25.7 (L)  26.0 - 34.0 pg Final   MCHC 02/18/2023 31.5  30.0 - 36.0 g/dL Final   RDW 81/19/1478 17.6 (H)  11.5 - 15.5 % Final   Platelets 02/18/2023 342  150 - 400 K/uL Final   nRBC 02/18/2023 0.0  0.0 - 0.2 % Final   Neutrophils Relative % 02/18/2023 39  % Final   Neutro Abs 02/18/2023 3.6  1.7 - 7.7 K/uL Final   Lymphocytes Relative 02/18/2023 48  % Final   Lymphs Abs 02/18/2023 4.5 (H)  0.7 - 4.0 K/uL Final   Monocytes Relative 02/18/2023 6  % Final   Monocytes Absolute 02/18/2023 0.6  0.1 - 1.0 K/uL Final   Eosinophils Relative 02/18/2023 6  % Final   Eosinophils Absolute 02/18/2023 0.6 (H)  0.0 - 0.5 K/uL Final   Basophils Relative 02/18/2023 1  % Final   Basophils Absolute 02/18/2023 0.1  0.0 - 0.1 K/uL Final   Immature Granulocytes 02/18/2023 0  % Final   Abs Immature Granulocytes 02/18/2023 0.03  0.00 - 0.07 K/uL Final   Performed at [facility], [address] [place], [place] [address]   Sodium 02/18/2023 137  135 - 145 mmol/L Final   Potassium 02/18/2023 4.3  3.5 - 5.1 mmol/L Final   Chloride 02/18/2023 105  98 - 111 mmol/L Final   CO2 02/18/2023 20 (L)  22 - 32 mmol/L Final   Glucose, Bld 02/18/2023 168 (H)  70 - 99 mg/dL Final   Glucose reference range applies only to samples taken after fasting for at least 8 hours.   BUN 02/18/2023 <5 (L)  6 - 20 mg/dL Final   Creatinine, Ser 02/18/2023 0.80  0.61 - 1.24 mg/dL Final   Calcium 13/02/6577 9.1  8.9 - 10.3 mg/dL  Final   Total Protein 02/18/2023 7.0  6.5 - 8.1 g/dL Final   Albumin 46/96/2952 3.8  3.5 - 5.0 g/dL Final   AST 84/13/2440 86 (H)  15 - 41 U/L Final   ALT 02/18/2023 77 (H)  0 - 44 U/L Final  Alkaline Phosphatase 02/18/2023 54  38 - 126 U/L Final   Total Bilirubin 02/18/2023 1.1  0.3 - 1.2 mg/dL Final   GFR, Estimated 02/18/2023 >60  >60 mL/min Final   Comment: (NOTE) Calculated using the CKD-EPI Creatinine Equation (2021)    Anion gap 02/18/2023 12  5 - 15 Final   Performed at [facility], [address] [place], [place] [address]   Cholesterol 02/18/2023 224 (H)  0 - 200 mg/dL Final   Triglycerides 16/01/3709 315 (H)  <150 mg/dL Final   HDL 62/69/4854 37 (L)  >40 mg/dL Final   Total CHOL/HDL Ratio 02/18/2023 6.1  RATIO Final   VLDL 02/18/2023 63 (H)  0 - 40 mg/dL Final   LDL Cholesterol 02/18/2023 124 (H)  0 - 99 mg/dL Final   Comment:        Total Cholesterol/HDL:CHD Risk Coronary Heart Disease Risk Table                     Men   Women  1/2 Average Risk   3.4   3.3  Average Risk       5.0   4.4  2 X Average Risk   9.6   7.1  3 X Average Risk  23.4   11.0        Use the calculated Patient Ratio above and the CHD Risk Table to determine the patient's CHD Risk.        ATP III CLASSIFICATION (LDL):  <100     mg/dL   Optimal  627-035  mg/dL   Near or Above                    Optimal  130-159  mg/dL   Borderline  009-381  mg/dL   High  >829     mg/dL   Very High Performed at [facility], [address], [place], [place] [address]    POC Amphetamine UR 02/18/2023 None Detected  NONE DETECTED (Cut Off Level 1000 ng/mL) Final   POC Secobarbital (BAR) 02/18/2023 None Detected  NONE DETECTED (Cut Off Level 300 ng/mL) Final   POC Buprenorphine (BUP) 02/18/2023 None Detected  NONE DETECTED (Cut Off Level 10 ng/mL) Final   POC Oxazepam (BZO) 02/18/2023 Positive (A)  NONE DETECTED (Cut Off Level 300 ng/mL) Final   POC Cocaine UR 02/18/2023 Positive (A)  NONE DETECTED  (Cut Off Level 300 ng/mL) Final   POC Methamphetamine UR 02/18/2023 None Detected  NONE DETECTED (Cut Off Level 1000 ng/mL) Final   POC Morphine 02/18/2023 None Detected  NONE DETECTED (Cut Off Level 300 ng/mL) Final   POC Methadone UR 02/18/2023 None Detected  NONE DETECTED (Cut Off Level 300 ng/mL) Final   POC Oxycodone UR 02/18/2023 None Detected  NONE DETECTED (Cut Off Level 100 ng/mL) Final   POC Marijuana UR 02/18/2023 None Detected  NONE DETECTED (Cut Off Level 50 ng/mL) Final   Alcohol, Ethyl (B) 02/18/2023 180 (H)  <10 mg/dL Final   Comment: (NOTE) Lowest detectable limit for serum alcohol is 10 mg/dL.  For medical purposes only. Performed at [facility], [address], [place], [place] [address]   Admission on 01/31/2023, Discharged on 01/31/2023  Component Date Value Ref Range Status   WBC 01/31/2023 7.4  4.0 - 10.5 K/uL Final   RBC 01/31/2023 6.12 (H)  4.22 - 5.81 MIL/uL Final   Hemoglobin 01/31/2023 15.7  13.0 - 17.0 g/dL Final  HCT 01/31/2023 49.7  39.0 - 52.0 % Final   MCV 01/31/2023 81.2  80.0 - 100.0 fL Final   MCH 01/31/2023 25.7 (L)  26.0 - 34.0 pg Final   MCHC 01/31/2023 31.6  30.0 - 36.0 g/dL Final   RDW 69/62/9528 14.8  11.5 - 15.5 % Final   Platelets 01/31/2023 343  150 - 400 K/uL Final   nRBC 01/31/2023 0.0  0.0 - 0.2 % Final   Neutrophils Relative % 01/31/2023 52  % Final   Neutro Abs 01/31/2023 3.8  1.7 - 7.7 K/uL Final   Lymphocytes Relative 01/31/2023 37  % Final   Lymphs Abs 01/31/2023 2.8  0.7 - 4.0 K/uL Final   Monocytes Relative 01/31/2023 7  % Final   Monocytes Absolute 01/31/2023 0.5  0.1 - 1.0 K/uL Final   Eosinophils Relative 01/31/2023 3  % Final   Eosinophils Absolute 01/31/2023 0.2  0.0 - 0.5 K/uL Final   Basophils Relative 01/31/2023 1  % Final   Basophils Absolute 01/31/2023 0.1  0.0 - 0.1 K/uL Final   Immature Granulocytes 01/31/2023 0  % Final   Abs Immature Granulocytes 01/31/2023 0.02  0.00 - 0.07 K/uL Final   Performed at  [facility], [address] [place], [place] [address]   Sodium 01/31/2023 136  135 - 145 mmol/L Final   Potassium 01/31/2023 3.7  3.5 - 5.1 mmol/L Final   Chloride 01/31/2023 102  98 - 111 mmol/L Final   CO2 01/31/2023 20 (L)  22 - 32 mmol/L Final   Glucose, Bld 01/31/2023 149 (H)  70 - 99 mg/dL Final   Glucose reference range applies only to samples taken after fasting for at least 8 hours.   BUN 01/31/2023 <5 (L)  6 - 20 mg/dL Final   Creatinine, Ser 01/31/2023 0.76  0.61 - 1.24 mg/dL Final   Calcium 40/04/2724 9.5  8.9 - 10.3 mg/dL Final   Total Protein 36/64/4034 7.2  6.5 - 8.1 g/dL Final   Albumin 74/25/9563 3.7  3.5 - 5.0 g/dL Final   AST 87/56/4332 38  15 - 41 U/L Final   ALT 01/31/2023 48 (H)  0 - 44 U/L Final   Alkaline Phosphatase 01/31/2023 47  38 - 126 U/L Final   Total Bilirubin 01/31/2023 0.5  0.3 - 1.2 mg/dL Final   GFR, Estimated 01/31/2023 >60  >60 mL/min Final   Comment: (NOTE) Calculated using the CKD-EPI Creatinine Equation (2021)    Anion gap 01/31/2023 14  5 - 15 Final   Performed at [facility], [address] [place], [place] [address]   Alcohol, Ethyl (B) 01/31/2023 188 (H)  <10 mg/dL Final   Comment: (NOTE) Lowest detectable limit for serum alcohol is 10 mg/dL.  For medical purposes only. Performed at [facility], [address], [place], [place] [address]    Cholesterol 01/31/2023 218 (H)  0 - 200 mg/dL Final   Triglycerides 63/07/6008 192 (H)  <150 mg/dL Final   HDL 93/23/5573 33 (L)  >40 mg/dL Final   Total CHOL/HDL Ratio 01/31/2023 6.6  RATIO Final   VLDL 01/31/2023 38  0 - 40 mg/dL Final   LDL Cholesterol 01/31/2023 147 (H)  0 - 99 mg/dL Final   Comment:        Total Cholesterol/HDL:CHD Risk Coronary Heart Disease Risk Table                     Men   Women  1/2 Average Risk   3.4   3.3  Average Risk       5.0   4.4  2 X Average Risk   9.6   7.1  3 X Average Risk  23.4   11.0        Use the calculated Patient  Ratio above and the CHD Risk Table to determine the patient's CHD Risk.        ATP III CLASSIFICATION (LDL):  <100     mg/dL   Optimal  782-956  mg/dL   Near or Above                    Optimal  130-159  mg/dL   Borderline  213-086  mg/dL   High  >578     mg/dL   Very High Performed at [facility], [address] [place], [place] [address]    TSH 01/31/2023 0.993  0.350 - 4.500 uIU/mL Final   Comment: Performed by a 3rd Generation assay with a functional sensitivity of <=0.01 uIU/mL. Performed at [facility], [address] [place], [place] [address]    POC Amphetamine UR 01/31/2023 None Detected  NONE DETECTED (Cut Off Level 1000 ng/mL) Final   POC Secobarbital (BAR) 01/31/2023 None Detected  NONE DETECTED (Cut Off Level 300 ng/mL) Final   POC Buprenorphine (BUP) 01/31/2023 None Detected  NONE DETECTED (Cut Off Level 10 ng/mL) Final   POC Oxazepam (BZO) 01/31/2023 None Detected  NONE DETECTED (Cut Off Level 300 ng/mL) Final   POC Cocaine UR 01/31/2023 None Detected  NONE DETECTED (Cut Off Level 300 ng/mL) Final   POC Methamphetamine UR 01/31/2023 None Detected  NONE DETECTED (Cut Off Level 1000 ng/mL) Final   POC Morphine 01/31/2023 None Detected  NONE DETECTED (Cut Off Level 300 ng/mL) Final   POC Methadone UR 01/31/2023 None Detected  NONE DETECTED (Cut Off Level 300 ng/mL) Final   POC Oxycodone UR 01/31/2023 None Detected  NONE DETECTED (Cut Off Level 100 ng/mL) Final   POC Marijuana UR 01/31/2023 None Detected  NONE DETECTED (Cut Off Level 50 ng/mL) Final  Admission on 01/24/2023, Discharged on 01/24/2023  Component Date Value Ref Range Status   Sodium 01/24/2023 135  135 - 145 mmol/L Final   Potassium 01/24/2023 3.8  3.5 - 5.1 mmol/L Final   Chloride 01/24/2023 104  98 - 111 mmol/L Final   CO2 01/24/2023 20 (L)  22 - 32 mmol/L Final   Glucose, Bld 01/24/2023 134 (H)  70 - 99 mg/dL Final   Glucose reference range applies only to samples taken after fasting for  at least 8 hours.   BUN 01/24/2023 <5 (L)  6 - 20 mg/dL Final   Creatinine, Ser 01/24/2023 0.73  0.61 - 1.24 mg/dL Final   Calcium 13/24/4010 8.9  8.9 - 10.3 mg/dL Final   Total Protein 27/25/3664 7.5  6.5 - 8.1 g/dL Final   Albumin 40/34/7425 4.0  3.5 - 5.0 g/dL Final   AST 95/63/8756 47 (H)  15 - 41 U/L Final   ALT 01/24/2023 57 (H)  0 - 44 U/L Final   Alkaline Phosphatase 01/24/2023 52  38 - 126 U/L Final   Total Bilirubin 01/24/2023 0.8  0.3 - 1.2 mg/dL Final   GFR, Estimated 01/24/2023 >60  >60 mL/min Final   Comment: (NOTE) Calculated using the CKD-EPI Creatinine Equation (2021)    Anion gap 01/24/2023 11  5 - 15 Final   Performed at  [facility], [address], [place], [place] [address]   WBC 01/24/2023 6.8  4.0 - 10.5 K/uL Final   RBC 01/24/2023 5.97 (H)  4.22 - 5.81 MIL/uL Final   Hemoglobin 01/24/2023 15.0  13.0 - 17.0 g/dL Final   HCT 30/86/5784 47.8  39.0 - 52.0 % Final   MCV 01/24/2023 80.1  80.0 - 100.0 fL Final   MCH 01/24/2023 25.1 (L)  26.0 - 34.0 pg Final   MCHC 01/24/2023 31.4  30.0 - 36.0 g/dL Final   RDW 69/62/9528 15.4  11.5 - 15.5 % Final   Platelets 01/24/2023 331  150 - 400 K/uL Final   nRBC 01/24/2023 0.0  0.0 - 0.2 % Final   Neutrophils Relative % 01/24/2023 48  % Final   Neutro Abs 01/24/2023 3.3  1.7 - 7.7 K/uL Final   Lymphocytes Relative 01/24/2023 35  % Final   Lymphs Abs 01/24/2023 2.3  0.7 - 4.0 K/uL Final   Monocytes Relative 01/24/2023 10  % Final   Monocytes Absolute 01/24/2023 0.7  0.1 - 1.0 K/uL Final   Eosinophils Relative 01/24/2023 6  % Final   Eosinophils Absolute 01/24/2023 0.4  0.0 - 0.5 K/uL Final   Basophils Relative 01/24/2023 1  % Final   Basophils Absolute 01/24/2023 0.0  0.0 - 0.1 K/uL Final   Immature Granulocytes 01/24/2023 0  % Final   Abs Immature Granulocytes 01/24/2023 0.02  0.00 - 0.07 K/uL Final   Performed at [facility], [address], [place], [place] [address]   Troponin I (High Sensitivity)  01/24/2023 4  <18 ng/L Final   Comment: (NOTE) Elevated high sensitivity troponin I (hsTnI) values and significant  changes across serial measurements may suggest ACS but many other  chronic and acute conditions are known to elevate hsTnI results.  Refer to the "Links" section for chest pain algorithms and additional  guidance. Performed at [facility], [address], [place], [place] [address]    Total CK 01/24/2023 610 (H)  49 - 397 U/L Final   Performed at [facility], [address] [place], [place] [address]   Magnesium 01/24/2023 2.0  1.7 - 2.4 mg/dL Final   Performed at [facility], [address], [place], [place] [address]   Troponin I (High Sensitivity) 01/24/2023 4  <18 ng/L Final   Comment: (NOTE) Elevated high sensitivity troponin I (hsTnI) values and significant  changes across serial measurements may suggest ACS but many other  chronic and acute conditions are known to elevate hsTnI results.  Refer to the "Links" section for chest pain algorithms and additional  guidance. Performed at [facility], [address] [place], [place] [address]   Admission on 01/06/2023, Discharged on 01/06/2023  Component Date Value Ref Range Status   Sodium 01/06/2023 138  135 - 145 mmol/L Final   Potassium 01/06/2023 3.2 (L)  3.5 - 5.1 mmol/L Final   Chloride 01/06/2023 105  98 - 111 mmol/L Final   CO2 01/06/2023 22  22 - 32 mmol/L Final   Glucose, Bld 01/06/2023 143 (H)  70 - 99 mg/dL Final   Glucose reference range applies only to samples taken after fasting for at least 8 hours.   BUN 01/06/2023 <5 (L)  6 - 20 mg/dL Final   Creatinine, Ser 01/06/2023 0.68  0.61 - 1.24 mg/dL Final   Calcium 95/63/8756 8.6 (L)  8.9 - 10.3 mg/dL Final   GFR, Estimated  01/06/2023 >60  >60 mL/min Final   Comment: (NOTE) Calculated using the CKD-EPI Creatinine Equation (2021)    Anion gap 01/06/2023 11  5 - 15 Final   Performed at [facility], [address] [place], [place] [address]   Troponin I (High Sensitivity) 01/06/2023 2  <18 ng/L Final   Comment: (NOTE) Elevated high sensitivity troponin I (hsTnI) values and significant  changes across serial measurements may suggest ACS but many other  chronic and acute conditions are known to elevate hsTnI results.  Refer to the "Links" section for chest pain algorithms and additional  guidance. Performed at [facility], [address] [place], [place] [address]    WBC 01/06/2023 7.1  4.0 - 10.5 K/uL Final   RBC 01/06/2023 5.63  4.22 - 5.81 MIL/uL Final   Hemoglobin 01/06/2023 14.4  13.0 - 17.0 g/dL Final   HCT 25/36/6440 45.0  39.0 - 52.0 % Final   MCV 01/06/2023 79.9 (L)  80.0 - 100.0 fL Final   MCH 01/06/2023 25.6 (L)  26.0 - 34.0 pg Final   MCHC 01/06/2023 32.0  30.0 - 36.0 g/dL Final   RDW 34/74/2595 15.7 (H)  11.5 - 15.5 % Final   Platelets 01/06/2023 371  150 - 400 K/uL Final   nRBC 01/06/2023 0.0  0.0 - 0.2 % Final   Neutrophils Relative % 01/06/2023 67  % Final   Neutro Abs 01/06/2023 4.6  1.7 - 7.7 K/uL Final   Lymphocytes Relative 01/06/2023 25  % Final   Lymphs Abs 01/06/2023 1.8  0.7 - 4.0 K/uL Final   Monocytes Relative 01/06/2023 7  % Final   Monocytes Absolute 01/06/2023 0.5  0.1 - 1.0 K/uL Final   Eosinophils Relative 01/06/2023 1  % Final   Eosinophils Absolute 01/06/2023 0.1  0.0 - 0.5 K/uL Final   Basophils Relative 01/06/2023 0  % Final   Basophils Absolute 01/06/2023 0.0  0.0 - 0.1 K/uL Final   Immature Granulocytes 01/06/2023 0  % Final   Abs Immature Granulocytes 01/06/2023 0.02  0.00 - 0.07 K/uL Final   Performed at [facility], [address] [place], [place] [address]   Troponin I (High Sensitivity) 01/06/2023 2  <18 ng/L Final   Comment: (NOTE) Elevated high sensitivity troponin I (hsTnI) values and significant  changes across serial measurements may suggest ACS but many other  chronic and acute conditions are known to elevate  hsTnI results.  Refer to the "Links" section for chest pain algorithms and additional  guidance. Performed at [facility], [address] [place], [place] [address]     Blood Alcohol level:  Lab Results  Component Value Date   ETH 180 (H) 02/18/2023   ETH 188 (H) 01/31/2023    Metabolic Disorder Labs: No results found for: "HGBA1C", "MPG" No results found for: "PROLACTIN" Lab Results  Component Value Date   CHOL 224 (H) 02/18/2023   TRIG 315 (H) 02/18/2023   HDL 37 (L) 02/18/2023   CHOLHDL 6.1 02/18/2023   VLDL 63 (H) 02/18/2023   LDLCALC 124 (H) 02/18/2023   LDLCALC 147 (H) 01/31/2023    Therapeutic Lab Levels: No results found for: "LITHIUM" No results found for: "VALPROATE" No results found for: "CBMZ"  Physical Findings   AUDIT    Flowsheet Row ED from 01/31/2023 in [facility]  Alcohol Use Disorder Identification Test Final Score (AUDIT) 29  PHQ2-9    Flowsheet Row ED from 01/31/2023 in [facility] Most recent reading at 01/31/2023  3:56 AM ED from 01/31/2023 in [facility] Most recent reading at 01/31/2023  3:00 AM  PHQ-2 Total Score 2 2  PHQ-9 Total Score 9 9      Flowsheet Row ED from 02/18/2023 in [facility] Most recent reading at 02/18/2023 12:04 PM ED from 02/18/2023 in [facility] Most recent reading at 02/18/2023  2:17 AM ED from 02/17/2023 in [facility] Most recent reading at 02/17/2023  5:46 PM  C-SSRS RISK CATEGORY No Risk No Risk No Risk        Musculoskeletal  Strength & Muscle Tone: within normal limits Gait & Station: normal Patient leans: N/A  Psychiatric Specialty Exam  Presentation  General Appearance:  Disheveled  Eye Contact: Fair  Speech: Normal Rate  Speech Volume: Normal  Handedness: Right   Mood and Affect   Mood: Depressed  Affect: Congruent; Constricted   Thought Process  Thought Processes: Coherent  Descriptions of Associations:Intact  Orientation:Full (Time, Place and Person)  Thought Content:Logical  Diagnosis of Schizophrenia or Schizoaffective disorder in past: No    Hallucinations:Hallucinations: None  Ideas of Reference:None  Suicidal Thoughts:Suicidal Thoughts: No  Homicidal Thoughts:Homicidal Thoughts: No   Sensorium  Memory: Immediate Fair; Recent Fair; Remote Fair  Judgment: Poor  Insight: Poor   Executive Functions  Concentration: Poor  Attention Span: Fair  Recall: [employer] of Knowledge: Fair  Language: Fair   Psychomotor Activity  Psychomotor Activity: Psychomotor Activity: Normal   Assets  Assets: Desire for Improvement; Resilience; Physical Health   Sleep  Sleep: Sleep: Poor     Physical Exam  Physical Exam Vitals reviewed.  Constitutional:      General: He is not in acute distress.    Appearance: He is obese.     Comments: Appears tired  HENT:     Head: Normocephalic and atraumatic.  Eyes:     Pupils: Pupils are equal, round, and reactive to light.  Pulmonary:     Effort: Pulmonary effort is normal.  Abdominal:     General: There is distension.     Palpations: Abdomen is soft.  Musculoskeletal:        General: Normal range of motion.  Skin:    General: Skin is warm and dry.  Neurological:     Mental Status: He is oriented to person, place, and time. Mental status is at baseline.  Psychiatric:        Mood and Affect: Mood normal.        Behavior: Behavior normal.   Review of Systems  Psychiatric/Behavioral:  Positive for depression and substance abuse. Negative for hallucinations and suicidal ideas. The patient has insomnia. The patient is not nervous/anxious.   All other systems reviewed and are negative.  Blood pressure 130/80, pulse 64, temperature 98.4 F (36.9 C), temperature source Oral,  resp. rate 18, SpO2 98%. There is no height or weight on file to calculate BMI.  Treatment Plan Summary:  Assessment:  Patient is a 32 year old male with a past psychiatric history of alcohol use disorder, severe who re-presents seeking alcohol detox.    Patient remains at psychiatric baseline.  Somewhat reticent to participate in interview.  Primary complaints are about sleep.  Does not appear to be undergoing acute withdrawal.  CIWAs: 0, 0.  Started on melatonin 3 mg yesterday to help  regulate sleep.  Eating well.  Next steps are to finish Ativan taper (day 2 of 4) and coordinate substance use residential treatment.  Patient's HIPAA likely remain poor in the setting of virtually certain untreated OSA.  We will discuss acamprosate initiation to target alcohol cravings.  Removed nicotine patch yesterday.  Asked nursing to give patient extra blankets and pillows.  Plan:  # Alcohol use disorder, severe: ongoing.    # Stimulant use disorder, severe, cocaine type: ongoing. # CIWA Ativan protocol: ongoing.  Day 2 of 4.  Last CIWAs 0, 0.  Last drink Saturday 7/20. - Continue lorazepam (Ativan) 1 mg 4 times daily x4 doses, 1 mg 3 times daily x3 doses, 1 mg 2 times daily x2 doses, 1 mg daily x1 dose - Continue lorazepam (Ativan) 1 mg every 6 hours as needed for CIWA greater than 10; Hydroxyzine 25 mg for CIWA less than 10 - Continue multivitamin with minerals 1 tablet daily. - Continue ondansetron disintegrating tablet 4 mg every 6 as needed/nausea or vomiting. - Continue loperamide 2 to 4 mg oral as needed/diarrhea or loose stools. - Given thiamine injection 100 mg IM once. - Continue thiamine tablet 500 mg Q8 hours. - Encourage patient to participate in therapeutic milieu while on unit. - Will consider acamprosate 7/24 to target alcohol cravings, in setting of elevated LFTs.   # Recent history of trauma: Ongoing. Collateral call with wife indicates of substance use is deeply tied to recent  traumatic experiences involving the death of multiple family members. - Will discuss options for outpatient psychiatric referral, and close medical follow-up.   # Tobacco use disorder: Ongoing. Patient vapes nicotine at home. - Discontinued NicoDerm patch 14 mg.    # History of myocardial infarction: ongoing.  EKG 7/21 indicates left atrial enlargement, no acute concern for MI.  No change from 7/3. # Concern for OSA: ongoing.  BMI 43.  Patient snores loudly on the unit. # Insomnia: Resolving. # Hypertension: Resolved.  # Concern for pre-diabetes: ongoing.  Glucose 168.  Elevated lipids. - Awaiting A1c results. - Continue melatonin 3 mg nightly for insomnia. - Continue trazodone 50 mg as needed nightly. - Will continue to monitor blood pressure and consider addition of amlodipine versus ARB.   # Disposition: Pending, likely LOS 2-4 days. Social work to explore options for residential treatment facilities after discharge.  Tomie China, MD 02/20/2023 8:43 AM

## 2023-02-20 NOTE — Group Note (Signed)
Group Topic: Balance in Life  Group Date: 02/20/2023 Start Time: 1130 End Time: 1203 Facilitators: Vonzell Schlatter B  Department: [facility]  Number of Participants: 2  Group Focus: daily focus Treatment Modality:  Psychoeducation Interventions utilized were problem solving Purpose: express feelings  Name: NEHEMIAS SAUCEDA Date of Birth: [birth year]  MR: [id number]    Level of Participation: did not attend group Quality of Participation: n/a Interactions with others: n/a Mood/Affect: n/a Triggers (if applicable): n/a Cognition: n/a Progress: Other Response: n/a Plan: follow-up needed  Patients Problems:  Patient Active Problem List   Diagnosis Date Noted   Stimulant use disorder 02/18/2023   Substance induced mood disorder (HCC) 02/18/2023   Nicotine use disorder 02/18/2023   Alcohol use disorder 01/31/2023

## 2023-02-20 NOTE — Group Note (Signed)
Group Topic: Change and Accountability  Group Date: 02/19/2023 Start Time: 2000 End Time: 2100 Facilitators: Hilma Favors, RN  Department: [facility]  Number of Participants: 1  Group Focus: personal responsibility Treatment Modality:  Behavior Modification Therapy Interventions utilized were support Purpose: regain self-worth  Name: [name] Date of Birth: 09-06-1990  MR: [id number]    Level of Participation: N/A Quality of Participation: N/A Interactions with others: N/A Mood/Affect: N/A Triggers (if applicable):  Cognition: N/A Progress: Other Response: N/A Plan: patient will be encouraged to attend group  Patients Problems:  Patient Active Problem List   Diagnosis Date Noted   Stimulant use disorder 02/18/2023   Substance induced mood disorder (HCC) 02/18/2023   Nicotine use disorder 02/18/2023   Alcohol use disorder 01/31/2023

## 2023-02-20 NOTE — Progress Notes (Signed)
Pt is asleep. Respirations are even and unlabored. No signs of acute distress noted. Staff will monitor for pt's safety. 

## 2023-02-20 NOTE — Progress Notes (Signed)
Pt complained of mild headache. Declined intervention. Will continue to monitor.

## 2023-02-20 NOTE — ED Notes (Signed)
Patient observed/assessed in the dayroom. Patient alert and oriented x 4. Affect is bright. Patient denies pain and anxiety. He denies A/V/H. He denies having any thoughts/plan of self harm and harm towards others. Fluid and snack offered. Patient states that appetite has been good throughout the day. Verbalizes no further complaints at this time. Will continue to monitor for safety and provide support.

## 2023-02-21 MED ORDER — HYDROXYZINE HCL 25 MG PO TABS
25.0000 mg | ORAL_TABLET | Freq: Four times a day (QID) | ORAL | Status: AC | PRN
  Administered 2023-02-21: 25 mg via ORAL
  Filled 2023-02-21: qty 1

## 2023-02-21 MED ORDER — ONDANSETRON 4 MG PO TBDP: 4.0000 mg | ORAL_TABLET | Freq: Four times a day (QID) | ORAL | Status: DC | PRN

## 2023-02-21 MED ORDER — LOPERAMIDE HCL 2 MG PO CAPS: 2.0000 mg | ORAL_CAPSULE | ORAL | Status: DC | PRN

## 2023-02-21 MED ORDER — NICOTINE POLACRILEX 2 MG MT GUM
4.0000 mg | CHEWING_GUM | OROMUCOSAL | Status: DC
  Administered 2023-02-21 – 2023-02-23 (×10): 4 mg via ORAL
  Filled 2023-02-21 (×11): qty 2

## 2023-02-21 MED ORDER — LORAZEPAM 1 MG PO TABS
1.0000 mg | ORAL_TABLET | Freq: Four times a day (QID) | ORAL | Status: AC | PRN
  Administered 2023-02-22: 1 mg via ORAL

## 2023-02-21 MED ORDER — ACAMPROSATE CALCIUM 333 MG PO TBEC
666.0000 mg | DELAYED_RELEASE_TABLET | Freq: Three times a day (TID) | ORAL | Status: DC
  Administered 2023-02-21 – 2023-02-23 (×9): 666 mg via ORAL
  Filled 2023-02-21 (×9): qty 2

## 2023-02-21 MED ORDER — TRAZODONE HCL 100 MG PO TABS
100.0000 mg | ORAL_TABLET | Freq: Every evening | ORAL | Status: DC | PRN
  Administered 2023-02-22 – 2023-02-23 (×2): 100 mg via ORAL
  Filled 2023-02-21 (×2): qty 1

## 2023-02-21 NOTE — ED Notes (Signed)
Patient was given lunch. 

## 2023-02-21 NOTE — ED Notes (Signed)
Patient was provided dinner

## 2023-02-21 NOTE — Group Note (Addendum)
Group Topic: Understanding Self  Group Date: 02/21/2023 Start Time: 1610 End Time: 1645 Facilitators: Oleh Genin, RN  Department: [facility]  Number of Participants: 10  Group Focus: acceptance, coping skills, daily focus, discharge education, and problem solving Treatment Modality:  Psychoeducation Interventions utilized were mental fitness, patient education, and problem solving Purpose: enhance coping skills, express feelings, and increase insight  Name: NIJEL FLINK Date of Birth: 16-Jul-1991  MR: [id number]    Level of Participation: active Quality of Participation: attentive and engaged Interactions with others: gave feedback Mood/Affect: appropriate Triggers (if applicable): N/A Cognition: goal directed Progress: Significant Response: Patient was participative   Plan: follow-up needed  Patients Problems:  Patient Active Problem List   Diagnosis Date Noted  . Stimulant use disorder 02/18/2023  . Substance induced mood disorder (HCC) 02/18/2023  . Nicotine use disorder 02/18/2023  . Alcohol use disorder 01/31/2023

## 2023-02-21 NOTE — ED Notes (Signed)
Patient is sleeping. Respirations equal and unlabored, skin warm and dry. No change in assessment or acuity. Routine safety checks conducted according to facility protocol. Will continue to monitor for safety.   

## 2023-02-21 NOTE — Progress Notes (Signed)
Pt's CIWA was 0. 

## 2023-02-21 NOTE — Group Note (Signed)
Group Topic: Positive Affirmations  Group Date: 02/21/2023 Start Time: 1015 End Time: 1045 Facilitators: Rayvon Char, Donata Clay; Derycke, Maryjane Hurter, NT  Department: [facility]  Number of Participants: 9  Group Focus: affirmation Treatment Modality:  Psychoeducation Interventions utilized were patient education Purpose: increase insight  Name: [name] Date of Birth: 25-May-1991  MR: [id number]    Level of Participation: Patient did not attend group.  Patients Problems:  Patient Active Problem List   Diagnosis Date Noted   Stimulant use disorder 02/18/2023   Substance induced mood disorder (HCC) 02/18/2023   Nicotine use disorder 02/18/2023   Alcohol use disorder 01/31/2023

## 2023-02-21 NOTE — ED Notes (Signed)
Pt laying in bed calm and cooperative no c/o pain or  distress will continue to monitor for safety

## 2023-02-21 NOTE — ED Notes (Signed)
Pt sleeping@this  time.breathing even ad unlabored.will continue to monitor for safety

## 2023-02-21 NOTE — Group Note (Unsigned)
Group Topic: Communication  Group Date: 02/21/2023 Start Time: 1936 End Time: 2000 Facilitators: Rae Lips B  Department: [facility]  Number of Participants: 10  Group Focus: activities of daily living skills Treatment Modality:  Leisure Development Interventions utilized were story telling Purpose: enhance coping skills, express feelings, increase insight, regain self-worth, reinforce self-care, and relapse prevention strategies  Name: NENG ALBEE Date of Birth: 21-Apr-1991  MR: [id number]    Level of Participation: active Quality of Participation: attentive Interactions with others: gave feedback Mood/Affect: appropriate Triggers (if applicable): NA Cognition: coherent/clear Progress: Gaining insight Response: NA Plan: patient will be encouraged to keep going to groups.   Patients Problems:  Patient Active Problem List   Diagnosis Date Noted   Stimulant use disorder 02/18/2023   Substance induced mood disorder (HCC) 02/18/2023   Nicotine use disorder 02/18/2023   Alcohol use disorder 01/31/2023

## 2023-02-21 NOTE — ED Notes (Signed)
Patient was given breakfast.  

## 2023-02-21 NOTE — Discharge Planning (Signed)
LCSW went and spoke with patient on this morning to provide an update. Patient was informed that LCSW has attempted to get in contact with the [facility] [phone] however received no answer. Voice message was left at 8:46am with Bradly Chris requesting phone call back. LCSW followed up with [facility] regarding referral and patient has been advised to call in to complete phone screening. Number provided to patient. Patient is aware that he is still under review with [facility] and there are no updates at this time. LCSW will continue to follow and provide updates as received. No other needs were reported by the patient.   Fernande Boyden, LCSW Clinical Social Worker [facility] Ph: [phone]

## 2023-02-21 NOTE — Group Note (Signed)
Group Topic: Wellness  Group Date: 02/20/2023 Start Time: 1900 End Time: 1930 Facilitators: Emmit Pomfret D, NT  Department: [facility]  Number of Participants: 1  Group Focus: acceptance Treatment Modality:  Psychoeducation Interventions utilized were assignment Purpose: increase insight  Name: [name] Date of Birth: 1991/07/31  MR: [id number]    Level of Participation: withdrawn Quality of Participation: withdrawn Interactions with others:   Mood/Affect:   Triggers (if applicable):   Cognition:   Progress: Other Response:   Plan: follow-up needed  Patients Problems:  Patient Active Problem List   Diagnosis Date Noted   Stimulant use disorder 02/18/2023   Substance induced mood disorder (HCC) 02/18/2023   Nicotine use disorder 02/18/2023   Alcohol use disorder 01/31/2023

## 2023-02-21 NOTE — ED Provider Notes (Cosign Needed Addendum)
Behavioral Health Progress Note  Date and Time: 02/21/2023 8:40 AM Name: [name] MRN:  [id number]  Subjective: Blood pressure 129/91.  Otherwise, vitals within normal limits.  NAEON.  Still awaiting A1c.  No refusals.  No PRNs.  CIWAs: 2, 3, 0.  Day 3 of 4 Ativan taper.  On interview, patient is standing.  Appears to have showered.  Says that he could not sleep last night and had been periodically waking up.  He received pillows and blankets, as requested yesterday.  Patient had been sleeping throughout the day yesterday as well.  Otherwise, he is "doing okay."  He is eating well.  Denied SI, HI, and AVH.  The patient noted some boredom, asked if writer knew how to play chess.  Will try to find a chess player in the milieu today.  Asked if writer called wife, patient was updated on wife's current status.  Patient was amenable to acamprosate.  Noted some nicotine cravings, and asked for gum.  On collateral interview with wife [name] [phone], left HIPAA compliant voicemail, informing wife briefly of no new changes.   Diagnosis:  Final diagnoses:  Alcohol use disorder  Stimulant use disorder  Substance induced mood disorder (HCC)  Nicotine use disorder    Total Time spent with patient: 15 minutes  Past Psychiatric History: Per chart review, no previous psychiatric diagnoses.  Never seen a psychiatrist or therapist.  Not taking any psychiatric medications.  No past trials. Past Medical History: Per patient, had MI a few years ago years ago.  Related to cocaine use.  Concern for OSA.  On last admission 2 weeks ago, advised patient to seek sleep study. Family History: Multiple family members with schizophrenia. Family Psychiatric  History: None pertinent. Social History: Lives with grandmother. 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 Married. Drinks 2-3 40 oz beers daily and sometimes hard alcohol. Uses  cocaine approx 2 days per week.   Additional Social History:                         Sleep: Poor  Appetite: Normal  Current Medications:  Current Facility-Administered Medications  Medication Dose Route Frequency Provider Last Rate Last Admin   acamprosate (CAMPRAL) tablet 666 mg  666 mg Oral TID Tomie China, MD       hydrOXYzine (ATARAX) tablet 25 mg  25 mg Oral Q6H PRN Tomie China, MD       loperamide (IMODIUM) capsule 2-4 mg  2-4 mg Oral PRN Tomie China, MD       Melene Muller ON 02/22/2023] LORazepam (ATIVAN) tablet 1 mg  1 mg Oral Daily Cosby, Toni Amend, MD       LORazepam (ATIVAN) tablet 1 mg  1 mg Oral Q6H PRN Tomie China, MD       melatonin tablet 3 mg  3 mg Oral Devoria Glassing, MD   3 mg at 02/20/23 2120   multivitamin with minerals tablet 1 tablet  1 tablet Oral Daily Lamar Sprinkles, MD   1 tablet at 02/21/23 0831   nicotine polacrilex (NICORETTE) gum 4 mg  4 mg Oral Q4H while awake Tomie China, MD       ondansetron (ZOFRAN-ODT) disintegrating tablet 4 mg  4 mg Oral Q6H PRN Tomie China, MD       thiamine (VITAMIN B1) tablet 100 mg  100 mg Oral Daily Lamar Sprinkles, MD   100 mg at 02/21/23 [phone]  traZODone (DESYREL) tablet 100 mg  100 mg Oral QHS PRN Tomie China, MD       No current outpatient medications on file.    Labs  Lab Results:  Admission on 02/18/2023, Discharged on 02/18/2023  Component Date Value Ref Range Status   WBC 02/18/2023 9.3  4.0 - 10.5 K/uL Final   RBC 02/18/2023 5.80  4.22 - 5.81 MIL/uL Final   Hemoglobin 02/18/2023 14.9  13.0 - 17.0 g/dL Final   HCT 86/57/8469 47.3  39.0 - 52.0 % Final   MCV 02/18/2023 81.6  80.0 - 100.0 fL Final   MCH 02/18/2023 25.7 (L)  26.0 - 34.0 pg Final   MCHC 02/18/2023 31.5  30.0 - 36.0 g/dL Final   RDW 62/95/2841 17.6 (H)  11.5 - 15.5 % Final   Platelets 02/18/2023 342  150 - 400 K/uL Final   nRBC 02/18/2023 0.0  0.0 - 0.2 % Final   Neutrophils Relative %  02/18/2023 39  % Final   Neutro Abs 02/18/2023 3.6  1.7 - 7.7 K/uL Final   Lymphocytes Relative 02/18/2023 48  % Final   Lymphs Abs 02/18/2023 4.5 (H)  0.7 - 4.0 K/uL Final   Monocytes Relative 02/18/2023 6  % Final   Monocytes Absolute 02/18/2023 0.6  0.1 - 1.0 K/uL Final   Eosinophils Relative 02/18/2023 6  % Final   Eosinophils Absolute 02/18/2023 0.6 (H)  0.0 - 0.5 K/uL Final   Basophils Relative 02/18/2023 1  % Final   Basophils Absolute 02/18/2023 0.1  0.0 - 0.1 K/uL Final   Immature Granulocytes 02/18/2023 0  % Final   Abs Immature Granulocytes 02/18/2023 0.03  0.00 - 0.07 K/uL Final   Performed at [facility], [address], [place], [place] [address]   Sodium 02/18/2023 137  135 - 145 mmol/L Final   Potassium 02/18/2023 4.3  3.5 - 5.1 mmol/L Final   Chloride 02/18/2023 105  98 - 111 mmol/L Final   CO2 02/18/2023 20 (L)  22 - 32 mmol/L Final   Glucose, Bld 02/18/2023 168 (H)  70 - 99 mg/dL Final   Glucose reference range applies only to samples taken after fasting for at least 8 hours.   BUN 02/18/2023 <5 (L)  6 - 20 mg/dL Final   Creatinine, Ser 02/18/2023 0.80  0.61 - 1.24 mg/dL Final   Calcium 05/27/2535 9.1  8.9 - 10.3 mg/dL Final   Total Protein 64/40/3474 7.0  6.5 - 8.1 g/dL Final   Albumin 25/95/6387 3.8  3.5 - 5.0 g/dL Final   AST 56/43/3295 86 (H)  15 - 41 U/L Final   ALT 02/18/2023 77 (H)  0 - 44 U/L Final   Alkaline Phosphatase 02/18/2023 54  38 - 126 U/L Final   Total Bilirubin 02/18/2023 1.1  0.3 - 1.2 mg/dL Final   GFR, Estimated 02/18/2023 >60  >60 mL/min Final   Comment: (NOTE) Calculated using the CKD-EPI Creatinine Equation (2021)    Anion gap 02/18/2023 12  5 - 15 Final   Performed at [facility], [address] [place], [place] [address]   Cholesterol 02/18/2023 224 (H)  0 - 200 mg/dL Final   Triglycerides 66/12/3014 315 (H)  <150 mg/dL Final   HDL 08/08/3233 37 (L)  >40 mg/dL Final   Total CHOL/HDL Ratio 02/18/2023 6.1  RATIO Final    VLDL 02/18/2023 63 (H)  0 - 40 mg/dL Final   LDL Cholesterol 02/18/2023 124 (H)  0 - 99  mg/dL Final   Comment:        Total Cholesterol/HDL:CHD Risk Coronary Heart Disease Risk Table                     Men   Women  1/2 Average Risk   3.4   3.3  Average Risk       5.0   4.4  2 X Average Risk   9.6   7.1  3 X Average Risk  23.4   11.0        Use the calculated Patient Ratio above and the CHD Risk Table to determine the patient's CHD Risk.        ATP III CLASSIFICATION (LDL):  <100     mg/dL   Optimal  119-147  mg/dL   Near or Above                    Optimal  130-159  mg/dL   Borderline  829-562  mg/dL   High  >130     mg/dL   Very High Performed at [facility], [address], [place], [place] [address]    POC Amphetamine UR 02/18/2023 None Detected  NONE DETECTED (Cut Off Level 1000 ng/mL) Final   POC Secobarbital (BAR) 02/18/2023 None Detected  NONE DETECTED (Cut Off Level 300 ng/mL) Final   POC Buprenorphine (BUP) 02/18/2023 None Detected  NONE DETECTED (Cut Off Level 10 ng/mL) Final   POC Oxazepam (BZO) 02/18/2023 Positive (A)  NONE DETECTED (Cut Off Level 300 ng/mL) Final   POC Cocaine UR 02/18/2023 Positive (A)  NONE DETECTED (Cut Off Level 300 ng/mL) Final   POC Methamphetamine UR 02/18/2023 None Detected  NONE DETECTED (Cut Off Level 1000 ng/mL) Final   POC Morphine 02/18/2023 None Detected  NONE DETECTED (Cut Off Level 300 ng/mL) Final   POC Methadone UR 02/18/2023 None Detected  NONE DETECTED (Cut Off Level 300 ng/mL) Final   POC Oxycodone UR 02/18/2023 None Detected  NONE DETECTED (Cut Off Level 100 ng/mL) Final   POC Marijuana UR 02/18/2023 None Detected  NONE DETECTED (Cut Off Level 50 ng/mL) Final   Alcohol, Ethyl (B) 02/18/2023 180 (H)  <10 mg/dL Final   Comment: (NOTE) Lowest detectable limit for serum alcohol is 10 mg/dL.  For medical purposes only. Performed at [facility], [address] [place], [place] [address]   Admission on 01/31/2023,  Discharged on 01/31/2023  Component Date Value Ref Range Status   WBC 01/31/2023 7.4  4.0 - 10.5 K/uL Final   RBC 01/31/2023 6.12 (H)  4.22 - 5.81 MIL/uL Final   Hemoglobin 01/31/2023 15.7  13.0 - 17.0 g/dL Final   HCT 95/28/4132 49.7  39.0 - 52.0 % Final   MCV 01/31/2023 81.2  80.0 - 100.0 fL Final   MCH 01/31/2023 25.7 (L)  26.0 - 34.0 pg Final   MCHC 01/31/2023 31.6  30.0 - 36.0 g/dL Final   RDW 44/07/270 14.8  11.5 - 15.5 % Final   Platelets 01/31/2023 343  150 - 400 K/uL Final   nRBC 01/31/2023 0.0  0.0 - 0.2 % Final   Neutrophils Relative % 01/31/2023 52  % Final   Neutro Abs 01/31/2023 3.8  1.7 - 7.7 K/uL Final   Lymphocytes Relative 01/31/2023 37  % Final   Lymphs Abs 01/31/2023 2.8  0.7 - 4.0 K/uL Final   Monocytes Relative 01/31/2023 7  % Final   Monocytes Absolute 01/31/2023 0.5  0.1 - 1.0 K/uL Final   Eosinophils Relative 01/31/2023 3  % Final   Eosinophils Absolute 01/31/2023 0.2  0.0 - 0.5 K/uL Final   Basophils Relative 01/31/2023 1  % Final   Basophils Absolute 01/31/2023 0.1  0.0 - 0.1 K/uL Final   Immature Granulocytes 01/31/2023 0  % Final   Abs Immature Granulocytes 01/31/2023 0.02  0.00 - 0.07 K/uL Final   Performed at [facility], [address] [place], [place] [address]   Sodium 01/31/2023 136  135 - 145 mmol/L Final   Potassium 01/31/2023 3.7  3.5 - 5.1 mmol/L Final   Chloride 01/31/2023 102  98 - 111 mmol/L Final   CO2 01/31/2023 20 (L)  22 - 32 mmol/L Final   Glucose, Bld 01/31/2023 149 (H)  70 - 99 mg/dL Final   Glucose reference range applies only to samples taken after fasting for at least 8 hours.   BUN 01/31/2023 <5 (L)  6 - 20 mg/dL Final   Creatinine, Ser 01/31/2023 0.76  0.61 - 1.24 mg/dL Final   Calcium 60/45/4098 9.5  8.9 - 10.3 mg/dL Final   Total Protein 11/91/4782 7.2  6.5 - 8.1 g/dL Final   Albumin 95/62/1308 3.7  3.5 - 5.0 g/dL Final   AST 65/78/4696 38  15 - 41 U/L Final   ALT 01/31/2023 48 (H)  0 - 44 U/L Final   Alkaline  Phosphatase 01/31/2023 47  38 - 126 U/L Final   Total Bilirubin 01/31/2023 0.5  0.3 - 1.2 mg/dL Final   GFR, Estimated 01/31/2023 >60  >60 mL/min Final   Comment: (NOTE) Calculated using the CKD-EPI Creatinine Equation (2021)    Anion gap 01/31/2023 14  5 - 15 Final   Performed at [facility], [address] [place], [place] [address]   Alcohol, Ethyl (B) 01/31/2023 188 (H)  <10 mg/dL Final   Comment: (NOTE) Lowest detectable limit for serum alcohol is 10 mg/dL.  For medical purposes only. Performed at [facility], [address] [place], [place] [address]    Cholesterol 01/31/2023 218 (H)  0 - 200 mg/dL Final   Triglycerides 40/04/2724 192 (H)  <150 mg/dL Final   HDL 36/64/4034 33 (L)  >40 mg/dL Final   Total CHOL/HDL Ratio 01/31/2023 6.6  RATIO Final   VLDL 01/31/2023 38  0 - 40 mg/dL Final   LDL Cholesterol 01/31/2023 147 (H)  0 - 99 mg/dL Final   Comment:        Total Cholesterol/HDL:CHD Risk Coronary Heart Disease Risk Table                     Men   Women  1/2 Average Risk   3.4   3.3  Average Risk       5.0   4.4  2 X Average Risk   9.6   7.1  3 X Average Risk  23.4   11.0        Use the calculated Patient Ratio above and the CHD Risk Table to determine the patient's CHD Risk.        ATP III CLASSIFICATION (LDL):  <100     mg/dL   Optimal  742-595  mg/dL   Near or Above                    Optimal  130-159  mg/dL   Borderline  638-756  mg/dL   High  >433  mg/dL   Very High Performed at [facility], [address], [place], [place] [address]    TSH 01/31/2023 0.993  0.350 - 4.500 uIU/mL Final   Comment: Performed by a 3rd Generation assay with a functional sensitivity of <=0.01 uIU/mL. Performed at [facility], [address], [place], [place] [address]    POC Amphetamine UR 01/31/2023 None Detected  NONE DETECTED (Cut Off Level 1000 ng/mL) Final   POC Secobarbital (BAR) 01/31/2023 None Detected  NONE DETECTED (Cut Off Level 300  ng/mL) Final   POC Buprenorphine (BUP) 01/31/2023 None Detected  NONE DETECTED (Cut Off Level 10 ng/mL) Final   POC Oxazepam (BZO) 01/31/2023 None Detected  NONE DETECTED (Cut Off Level 300 ng/mL) Final   POC Cocaine UR 01/31/2023 None Detected  NONE DETECTED (Cut Off Level 300 ng/mL) Final   POC Methamphetamine UR 01/31/2023 None Detected  NONE DETECTED (Cut Off Level 1000 ng/mL) Final   POC Morphine 01/31/2023 None Detected  NONE DETECTED (Cut Off Level 300 ng/mL) Final   POC Methadone UR 01/31/2023 None Detected  NONE DETECTED (Cut Off Level 300 ng/mL) Final   POC Oxycodone UR 01/31/2023 None Detected  NONE DETECTED (Cut Off Level 100 ng/mL) Final   POC Marijuana UR 01/31/2023 None Detected  NONE DETECTED (Cut Off Level 50 ng/mL) Final  Admission on 01/24/2023, Discharged on 01/24/2023  Component Date Value Ref Range Status   Sodium 01/24/2023 135  135 - 145 mmol/L Final   Potassium 01/24/2023 3.8  3.5 - 5.1 mmol/L Final   Chloride 01/24/2023 104  98 - 111 mmol/L Final   CO2 01/24/2023 20 (L)  22 - 32 mmol/L Final   Glucose, Bld 01/24/2023 134 (H)  70 - 99 mg/dL Final   Glucose reference range applies only to samples taken after fasting for at least 8 hours.   BUN 01/24/2023 <5 (L)  6 - 20 mg/dL Final   Creatinine, Ser 01/24/2023 0.73  0.61 - 1.24 mg/dL Final   Calcium 64/33/2951 8.9  8.9 - 10.3 mg/dL Final   Total Protein 88/41/6606 7.5  6.5 - 8.1 g/dL Final   Albumin 30/16/0109 4.0  3.5 - 5.0 g/dL Final   AST 32/35/5732 47 (H)  15 - 41 U/L Final   ALT 01/24/2023 57 (H)  0 - 44 U/L Final   Alkaline Phosphatase 01/24/2023 52  38 - 126 U/L Final   Total Bilirubin 01/24/2023 0.8  0.3 - 1.2 mg/dL Final   GFR, Estimated 01/24/2023 >60  >60 mL/min Final   Comment: (NOTE) Calculated using the CKD-EPI Creatinine Equation (2021)    Anion gap 01/24/2023 11  5 - 15 Final   Performed at [facility], [address] [place], [place] [address]   WBC 01/24/2023 6.8  4.0 - 10.5 K/uL Final    RBC 01/24/2023 5.97 (H)  4.22 - 5.81 MIL/uL Final   Hemoglobin 01/24/2023 15.0  13.0 - 17.0 g/dL Final   HCT 27/12/2374 47.8  39.0 - 52.0 % Final   MCV 01/24/2023 80.1  80.0 - 100.0 fL Final   MCH 01/24/2023 25.1 (L)  26.0 - 34.0 pg Final   MCHC 01/24/2023 31.4  30.0 - 36.0 g/dL Final   RDW 28/31/5176 15.4  11.5 - 15.5 % Final   Platelets 01/24/2023 331  150 - 400 K/uL Final   nRBC 01/24/2023 0.0  0.0 - 0.2 % Final   Neutrophils Relative % 01/24/2023 48  % Final   Neutro Abs  01/24/2023 3.3  1.7 - 7.7 K/uL Final   Lymphocytes Relative 01/24/2023 35  % Final   Lymphs Abs 01/24/2023 2.3  0.7 - 4.0 K/uL Final   Monocytes Relative 01/24/2023 10  % Final   Monocytes Absolute 01/24/2023 0.7  0.1 - 1.0 K/uL Final   Eosinophils Relative 01/24/2023 6  % Final   Eosinophils Absolute 01/24/2023 0.4  0.0 - 0.5 K/uL Final   Basophils Relative 01/24/2023 1  % Final   Basophils Absolute 01/24/2023 0.0  0.0 - 0.1 K/uL Final   Immature Granulocytes 01/24/2023 0  % Final   Abs Immature Granulocytes 01/24/2023 0.02  0.00 - 0.07 K/uL Final   Performed at [facility], [address], [place], [place] [address]   Troponin I (High Sensitivity) 01/24/2023 4  <18 ng/L Final   Comment: (NOTE) Elevated high sensitivity troponin I (hsTnI) values and significant  changes across serial measurements may suggest ACS but many other  chronic and acute conditions are known to elevate hsTnI results.  Refer to the "Links" section for chest pain algorithms and additional  guidance. Performed at [facility], [address], [place], [place] [address]    Total CK 01/24/2023 610 (H)  49 - 397 U/L Final   Performed at [facility], [address], [place], [place] [address]   Magnesium 01/24/2023 2.0  1.7 - 2.4 mg/dL Final   Performed at [facility], [address], [place], [place] [address]   Troponin I (High Sensitivity) 01/24/2023 4  <18 ng/L Final   Comment: (NOTE) Elevated high  sensitivity troponin I (hsTnI) values and significant  changes across serial measurements may suggest ACS but many other  chronic and acute conditions are known to elevate hsTnI results.  Refer to the "Links" section for chest pain algorithms and additional  guidance. Performed at [facility], [address], [place], [place] [address]   Admission on 01/06/2023, Discharged on 01/06/2023  Component Date Value Ref Range Status   Sodium 01/06/2023 138  135 - 145 mmol/L Final   Potassium 01/06/2023 3.2 (L)  3.5 - 5.1 mmol/L Final   Chloride 01/06/2023 105  98 - 111 mmol/L Final   CO2 01/06/2023 22  22 - 32 mmol/L Final   Glucose, Bld 01/06/2023 143 (H)  70 - 99 mg/dL Final   Glucose reference range applies only to samples taken after fasting for at least 8 hours.   BUN 01/06/2023 <5 (L)  6 - 20 mg/dL Final   Creatinine, Ser 01/06/2023 0.68  0.61 - 1.24 mg/dL Final   Calcium 13/02/6577 8.6 (L)  8.9 - 10.3 mg/dL Final   GFR, Estimated 01/06/2023 >60  >60 mL/min Final   Comment: (NOTE) Calculated using the CKD-EPI Creatinine Equation (2021)    Anion gap 01/06/2023 11  5 - 15 Final   Performed at [facility], [address] [place], [place] [address]   Troponin I (High Sensitivity) 01/06/2023 2  <18 ng/L Final   Comment: (NOTE) Elevated high sensitivity troponin I (hsTnI) values and significant  changes across serial measurements may suggest ACS but many other  chronic and acute conditions are known to elevate hsTnI results.  Refer to the "Links" section for chest pain algorithms and additional  guidance. Performed at [facility], [address] [place], [place] [address]    WBC 01/06/2023 7.1  4.0 - 10.5 K/uL Final   RBC 01/06/2023 5.63  4.22 - 5.81 MIL/uL Final   Hemoglobin 01/06/2023 14.4  13.0 - 17.0 g/dL Final   HCT 82/95/6213 45.0  39.0 - 52.0 % Final   MCV 01/06/2023 79.9 (L)  80.0 - 100.0 fL Final   MCH 01/06/2023 25.6 (L)  26.0 - 34.0 pg Final    MCHC 01/06/2023 32.0  30.0 - 36.0 g/dL Final   RDW 08/65/7846 15.7 (H)  11.5 - 15.5 % Final   Platelets 01/06/2023 371  150 - 400 K/uL Final   nRBC 01/06/2023 0.0  0.0 - 0.2 % Final   Neutrophils Relative % 01/06/2023 67  % Final   Neutro Abs 01/06/2023 4.6  1.7 - 7.7 K/uL Final   Lymphocytes Relative 01/06/2023 25  % Final   Lymphs Abs 01/06/2023 1.8  0.7 - 4.0 K/uL Final   Monocytes Relative 01/06/2023 7  % Final   Monocytes Absolute 01/06/2023 0.5  0.1 - 1.0 K/uL Final   Eosinophils Relative 01/06/2023 1  % Final   Eosinophils Absolute 01/06/2023 0.1  0.0 - 0.5 K/uL Final   Basophils Relative 01/06/2023 0  % Final   Basophils Absolute 01/06/2023 0.0  0.0 - 0.1 K/uL Final   Immature Granulocytes 01/06/2023 0  % Final   Abs Immature Granulocytes 01/06/2023 0.02  0.00 - 0.07 K/uL Final   Performed at [facility], [address] [place], [place] [address]   Troponin I (High Sensitivity) 01/06/2023 2  <18 ng/L Final   Comment: (NOTE) Elevated high sensitivity troponin I (hsTnI) values and significant  changes across serial measurements may suggest ACS but many other  chronic and acute conditions are known to elevate hsTnI results.  Refer to the "Links" section for chest pain algorithms and additional  guidance. Performed at [facility], [address] [place], [place] [address]     Blood Alcohol level:  Lab Results  Component Value Date   ETH 180 (H) 02/18/2023   ETH 188 (H) 01/31/2023    Metabolic Disorder Labs: No results found for: "HGBA1C", "MPG" No results found for: "PROLACTIN" Lab Results  Component Value Date   CHOL 224 (H) 02/18/2023   TRIG 315 (H) 02/18/2023   HDL 37 (L) 02/18/2023   CHOLHDL 6.1 02/18/2023   VLDL 63 (H) 02/18/2023   LDLCALC 124 (H) 02/18/2023   LDLCALC 147 (H) 01/31/2023    Therapeutic Lab Levels: No results found for: "LITHIUM" No results found for: "VALPROATE" No results found for: "CBMZ"  Physical Findings    AUDIT    Flowsheet Row ED from 01/31/2023 in [facility]  Alcohol Use Disorder Identification Test Final Score (AUDIT) 29      PHQ2-9    Flowsheet Row ED from 01/31/2023 in [facility] Most recent reading at 01/31/2023  3:56 AM ED from 01/31/2023 in [facility] Most recent reading at 01/31/2023  3:00 AM  PHQ-2 Total Score 2 2  PHQ-9 Total Score 9 9      Flowsheet Row ED from 02/18/2023 in [facility] Most recent reading at 02/18/2023 12:04 PM ED from 02/18/2023 in [facility] Most recent reading at 02/18/2023  2:17 AM ED from 02/17/2023 in [facility] Most recent reading at 02/17/2023  5:46 PM  C-SSRS RISK CATEGORY No Risk No Risk No Risk        Musculoskeletal  Strength & Muscle Tone: within normal limits Gait &  Station: normal Patient leans: N/A  Psychiatric Specialty Exam  Presentation  General Appearance:  Appropriate for Environment  Eye Contact: Good  Speech: Normal Rate  Speech Volume: Normal  Handedness: Right   Mood and Affect  Mood: Euthymic  Affect: Appropriate; Congruent   Thought Process  Thought Processes: Coherent  Descriptions of Associations:Intact  Orientation:Full (Time, Place and Person)  Thought Content:Logical  Diagnosis of Schizophrenia or Schizoaffective disorder in past: No    Hallucinations:Hallucinations: None  Ideas of Reference:None  Suicidal Thoughts:Suicidal Thoughts: No  Homicidal Thoughts:Homicidal Thoughts: No   Sensorium  Memory: Immediate Fair; Recent Fair; Remote Fair  Judgment: Poor (improving)  Insight: Poor   Executive Functions  Concentration: Fair  Attention Span: Fair  Recall: Fair  Fund of Knowledge: Fair  Language: Fair   Psychomotor Activity  Psychomotor Activity: Psychomotor Activity: Normal   Assets   Assets: Desire for Improvement; Resilience; Physical Health   Sleep  Sleep: Sleep: Poor     Physical Exam  Physical Exam Vitals reviewed.  Constitutional:      General: He is not in acute distress.    Appearance: He is obese.     Comments: Appears tired  HENT:     Head: Normocephalic and atraumatic.  Eyes:     Pupils: Pupils are equal, round, and reactive to light.  Pulmonary:     Effort: Pulmonary effort is normal.  Abdominal:     General: There is distension.     Palpations: Abdomen is soft.  Musculoskeletal:        General: Normal range of motion.  Skin:    General: Skin is warm and dry.  Neurological:     Mental Status: He is oriented to person, place, and time. Mental status is at baseline.  Psychiatric:        Mood and Affect: Mood normal.        Behavior: Behavior normal.    Review of Systems  Psychiatric/Behavioral:  Positive for depression and substance abuse. Negative for hallucinations and suicidal ideas. The patient has insomnia. The patient is not nervous/anxious.   All other systems reviewed and are negative.  Blood pressure (!) 129/91, pulse 86, temperature 98.5 F (36.9 C), temperature source Oral, resp. rate 18, SpO2 98%. There is no height or weight on file to calculate BMI.  Treatment Plan Summary:  Assessment:  Patient is a 32 year old male with a past psychiatric history of alcohol use disorder, severe who re-presents seeking alcohol detox.    Patient appears at psychiatric baseline this morning.  He appears in relatively good spirits, somewhat jocular today.  Informed patient that we would be reaching out to various inpatient treatment options, patient amenable.  Patient also amenable to beginning acamprosate for alcohol cravings.  Patient said that he would try to stay awake until at least noon today to help regulate his sleep schedule.  Will increase trazodone dosing and provide nicotine gum, which patient requested.  Will reach out to wife  this afternoon.  No new concerns otherwise.  Plan:  # Alcohol use disorder, severe: ongoing.    # Stimulant use disorder, severe, cocaine type: ongoing. # CIWA Ativan protocol: ongoing.  Day 2 of 4.  Last CIWAs 0, 0.  Last drink Saturday 7/20. - Continue lorazepam (Ativan) 1 mg 4 times daily x4 doses, 1 mg 3 times daily x3 doses, 1 mg 2 times daily x2 doses, 1 mg daily x1 dose - Continue lorazepam (Ativan) 1 mg every 6 hours as needed for CIWA  greater than 10; Hydroxyzine 25 mg for CIWA less than 10 - Continue multivitamin with minerals 1 tablet daily. - Continue ondansetron disintegrating tablet 4 mg every 6 as needed/nausea or vomiting. - Continue loperamide 2 to 4 mg oral as needed/diarrhea or loose stools. - Given thiamine injection 100 mg IM once. - Continue thiamine tablet 500 mg Q8 hours. - Encourage patient to participate in therapeutic milieu while on unit. - Begin acamprosate 666 mg 3 times daily for alcohol cravings.   # Recent history of trauma: Ongoing. Collateral call with wife indicates of substance use is deeply tied to recent traumatic experiences involving the death of multiple family members. - Will discuss options for outpatient psychiatric referral, and close medical follow-up.   # Tobacco use disorder: Ongoing. Patient vapes nicotine at home. - Discontinued NicoDerm patch 14 mg.  - Begin nicotine gum 4 mg every 6 hours while awake.   # History of myocardial infarction: ongoing.  EKG 7/21 indicates left atrial enlargement, no acute concern for MI.  No change from 7/3. # Concern for OSA: ongoing.  BMI 43.  Patient snores loudly on the unit. # Insomnia: Resolving. # Hypertension: Resolved.  # Concern for pre-diabetes: ongoing.  Glucose 168.  Elevated lipids. - Continue waiting A1c results. - Continue melatonin 3 mg nightly for insomnia. - Increase trazodone to 100 mg as needed nightly. - Will continue to monitor blood pressure and consider addition of amlodipine  versus ARB.   # Disposition: Pending, likely LOS 2-4 days. Social work to explore options for residential treatment facilities after discharge.  Tomie China, MD 02/21/2023 8:40 AM

## 2023-02-21 NOTE — Group Note (Signed)
Group Topic: Understanding Self  Group Date: 02/21/2023 Start Time: 1610 End Time: 1650 Facilitators: Oleh Genin, RN  Department: [facility]  Number of Participants: 10  Group Focus: acceptance, communication, and coping skills Treatment Modality:  Psychoeducation Interventions utilized were exploration, mental fitness, patient education, and problem solving Purpose: enhance coping skills, express feelings, and improve communication skills  Name: [name] Date of Birth: 01/15/1991  MR: [id number]    Level of Participation: active Quality of Participation: attentive Interactions with others: gave feedback Mood/Affect: appropriate Triggers (if applicable): N/A  Cognition: goal directed and insightful Progress: Gaining insight Response: Patient was insightful and engaging throughout the group time.   Plan: follow-up needed  Patients Problems:  Patient Active Problem List   Diagnosis Date Noted   Stimulant use disorder 02/18/2023   Substance induced mood disorder (HCC) 02/18/2023   Nicotine use disorder 02/18/2023   Alcohol use disorder 01/31/2023

## 2023-02-21 NOTE — ED Notes (Signed)
Patient denies SI/HI and AVH. Patient has eaten breakfast and taken his medications. Patient is currently resting until group time. Patient is being monitored for safety.

## 2023-02-22 MED ORDER — BENZOCAINE 10 % MT GEL
1.0000 | Freq: Once | OROMUCOSAL | Status: AC
  Administered 2023-02-22: 1 via OROMUCOSAL

## 2023-02-22 NOTE — Group Note (Signed)
Group Topic: Communication  Group Date: 02/22/2023 Start Time: 2130 End Time: 2200 Facilitators: Guss Bunde  Department: [facility]  Number of Participants:   Group Focus: Wrap Up Group Treatment Modality:   Interventions utilized were  Purpose: This group is focused on patient's question and concerns about standard care and goals relating to discharge planning and bedside manner.  Patients have a chance to share positive experiences and per support.  Name: [name] Date of Birth: Feb 12, 1991  MR: [id number]    Level of Participation: active Quality of Participation: attentive Interactions with others:  Mood/Affect: appropriate Triggers (if applicable):  Cognition: goal directed Progress: Moderate Response: Patient is pleased with care and said he needs to stay focused and was happy to get a hold of his sponsor. Plan:   Patients Problems:  Patient Active Problem List   Diagnosis Date Noted   Stimulant use disorder 02/18/2023   Substance induced mood disorder (HCC) 02/18/2023   Nicotine use disorder 02/18/2023   Alcohol use disorder 01/31/2023

## 2023-02-22 NOTE — Care Management (Signed)
Care Management   Writer followed up on the referral to [facility] [phone].  Per Agustin Cree, the patient referral was received and the patient is still under review for placement.   [facility], Clinical research associate was only able to leave a voice mail message regarding the status of placement.   [facility], per intake worker, no open beds.

## 2023-02-22 NOTE — Group Note (Signed)
Group Topic: Recovery Basics  Group Date: 02/22/2023 Start Time: 0900 End Time: 1000 Facilitators: Candis Schatz, NT  Department: [facility]  Number of Participants: 7  Group Focus: daily focus Treatment Modality:  Spiritual Interventions utilized were support Purpose: increase insight  Name: [name] Date of Birth: 1990-10-11  MR: [id number]    Level of Participation: minimal Quality of Participation: minimal Interactions with others: Left group Mood/Affect: N/A Triggers (if applicable): N/A Cognition:  N/A Progress: Minimal Response: Minimal Plan: follow-up needed  Patients Problems:  Patient Active Problem List   Diagnosis Date Noted   Stimulant use disorder 02/18/2023   Substance induced mood disorder (HCC) 02/18/2023   Nicotine use disorder 02/18/2023   Alcohol use disorder 01/31/2023

## 2023-02-22 NOTE — Group Note (Signed)
Group Topic: Communication  Group Date: 02/22/2023 Start Time: 1300 End Time: 1320 Facilitators: Merrie Roof, RN  Department: [facility]  Number of Participants: 7  Group Focus: coping skills Treatment Modality:  Patient-Centered Therapy Interventions utilized were assignment Purpose: express feelings  Name: [name] Date of Birth: [birth year]  MR: [id number]    Level of Participation: active Quality of Participation: cooperative Interactions with others: gave feedback Mood/Affect: appropriate Triggers (if applicable):  Cognition: goal directed Progress: Significant Response:  Plan: patient will be encouraged to continue with therapy  Patients Problems:  Patient Active Problem List   Diagnosis Date Noted   Stimulant use disorder 02/18/2023   Substance induced mood disorder (HCC) 02/18/2023   Nicotine use disorder 02/18/2023   Alcohol use disorder 01/31/2023

## 2023-02-22 NOTE — ED Notes (Signed)
Patient is requesting discharge notified provider on [facility]

## 2023-02-22 NOTE — ED Notes (Signed)
Patient  sleeping in no acute stress. RR even and unlabored .Environment secured .Will continue to monitor for safely. 

## 2023-02-22 NOTE — ED Notes (Signed)
Patient alert and oriented x 3. Denies SI/HI/AVH. Denies intent or plan to harm self or others. Routine conducted according to faculty protocol. Encourage patient to notify staff with any needs or concerns. Patient verbalized agreement and understanding. Will continue to monitor for safety. 

## 2023-02-22 NOTE — ED Notes (Signed)
Patient in room. Environment is secured. Will continue to monitor for safety. 

## 2023-02-22 NOTE — ED Notes (Signed)
Patient observed/assessed at bedside lying in bed asleep. Patient alert and oriented to self and location. Patient denies pain and anxiety. He denies A/V/H. He denies having any thoughts/plan of self harm and harm towards others. Patient states that appetite has been good throughout the day. Verbalizes no further complaints at this time. Will continue to monitor and support.

## 2023-02-22 NOTE — ED Notes (Signed)
Patient is sleeping. Respirations equal and unlabored, skin warm and dry. No change in assessment or acuity. Routine safety checks conducted according to facility protocol. Will continue to monitor for safety.   

## 2023-02-22 NOTE — Group Note (Signed)
Group Topic: Recovery Basics  Group Date: 02/22/2023 Start Time: 2000 End Time: 2100 Facilitators: Guss Bunde  Department: [facility]  Number of Participants:   Group Focus:  Treatment Modality:   Interventions utilized were  Purpose:   Name: NOMAR BROAD Date of Birth: 11-21-1990  MR: [id number]    Level of Participation: Pt did not attend Quality of Participation:  Interactions with others:  Mood/Affect:  Triggers (if applicable):  Cognition:  Progress:  Response:  Plan:   Patients Problems:  Patient Active Problem List   Diagnosis Date Noted   Stimulant use disorder 02/18/2023   Substance induced mood disorder (HCC) 02/18/2023   Nicotine use disorder 02/18/2023   Alcohol use disorder 01/31/2023

## 2023-02-22 NOTE — ED Notes (Addendum)
Patient is requesting discharge. Patient sign the 72 hour form. Provider is aware and states to put the form in the chart. rn showed patient on the voluntary form where he signed stating he would have to request discharge in writing.

## 2023-02-22 NOTE — Group Note (Signed)
Group Topic: Wellness  Group Date: 02/22/2023 Start Time: 1140 End Time: 1220 Facilitators: Londell Moh, NT  Department: [facility]  Number of Participants: 10  Group Focus: other Nutrition Treatment Modality:  Psychoeducation Interventions utilized were patient education Purpose: increase insight  Name: [name] Date of Birth: 05/06/1991  MR: [id number]    Level of Participation: moderate Quality of Participation: attention seeking Interactions with others: sarcastic Mood/Affect: flat Triggers (if applicable): n/a Cognition: not focused Progress: Gaining insight Response: Pt attended group but interrupted the Dietitian multiple times. Plan: patient will be encouraged to continue to attend group in an appropriate manner.  Patients Problems:  Patient Active Problem List   Diagnosis Date Noted   Stimulant use disorder 02/18/2023   Substance induced mood disorder (HCC) 02/18/2023   Nicotine use disorder 02/18/2023   Alcohol use disorder 01/31/2023

## 2023-02-22 NOTE — ED Provider Notes (Addendum)
Behavioral Health Progress Note  Date and Time: 02/22/2023 8:23 AM Name: [name] MRN:  [id number]  Subjective: Vitals within normal limits.  No acute events overnight.  Awaiting A1c.  Refusals: Nicotine gum x 1.  No PRNs.  CIWAs: 0, 0, 0.  Day 4 of 4 Ativan taper.  On interview this morning, patient appeared at psychiatric baseline.  Attentive to interview but somewhat bored.  Noted that he was cold when he slept as it had arranged heavily outside.  Described mood as "all right."  No new concerns at this time.  Primary goal today will be to finalize disposition.  Writer provided patient with number to [employer].  Per social work Agricultural [employer], also being considered at [employer] and [facility].  Denied SI, HI, and [facility].  Writer terminated interview as it appeared that patient was interested in getting some breakfast.   Diagnosis:  Final diagnoses:  Alcohol use disorder  Stimulant use disorder  Substance induced mood disorder (HCC)  Nicotine use disorder    Total Time spent with patient: 15 minutes  Past Psychiatric History: Per chart review, no previous psychiatric diagnoses.  Never seen a psychiatrist or therapist.  Not taking any psychiatric medications.  No past trials. Past Medical History: Per patient, had MI a few years ago years ago.  Related to cocaine use.  Concern for OSA.  On last admission 2 weeks ago, advised patient to seek sleep study. Family History: Multiple family members with schizophrenia. Family Psychiatric  History: None pertinent. Social History: Lives with grandmother. 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 Married. Drinks 2-3 40 oz beers daily and sometimes hard alcohol. Uses cocaine approx 2 days per week.   Additional Social History:                         Sleep: Poor  Appetite: Normal  Current Medications:  Current Facility-Administered Medications  Medication Dose Route Frequency  Provider Last Rate Last Admin   acamprosate (CAMPRAL) tablet 666 mg  666 mg Oral TID Tomie China, MD   666 mg at 02/21/23 2108   loperamide (IMODIUM) capsule 2-4 mg  2-4 mg Oral PRN Tomie China, MD       LORazepam (ATIVAN) tablet 1 mg  1 mg Oral Daily Lamar Sprinkles, MD       LORazepam (ATIVAN) tablet 1 mg  1 mg Oral Q6H PRN Tomie China, MD       melatonin tablet 3 mg  3 mg Oral Devoria Glassing, MD   3 mg at 02/21/23 2107   multivitamin with minerals tablet 1 tablet  1 tablet Oral Daily Lamar Sprinkles, MD   1 tablet at 02/21/23 0831   nicotine polacrilex (NICORETTE) gum 4 mg  4 mg Oral Q4H while awake Tomie China, MD   4 mg at 02/21/23 2108   ondansetron (ZOFRAN-ODT) disintegrating tablet 4 mg  4 mg Oral Q6H PRN Tomie China, MD       thiamine (VITAMIN B1) tablet 100 mg  100 mg Oral Daily Lamar Sprinkles, MD   100 mg at 02/21/23 0831   traZODone (DESYREL) tablet 100 mg  100 mg Oral QHS PRN Tomie China, MD       No current outpatient medications on file.    Labs  Lab Results:  Admission on 02/18/2023, Discharged on 02/18/2023  Component Date Value Ref Range Status   WBC 02/18/2023 9.3  4.0 - 10.5 K/uL  Final   RBC 02/18/2023 5.80  4.22 - 5.81 MIL/uL Final   Hemoglobin 02/18/2023 14.9  13.0 - 17.0 g/dL Final   HCT 09/81/1914 47.3  39.0 - 52.0 % Final   MCV 02/18/2023 81.6  80.0 - 100.0 fL Final   MCH 02/18/2023 25.7 (L)  26.0 - 34.0 pg Final   MCHC 02/18/2023 31.5  30.0 - 36.0 g/dL Final   RDW 78/29/5621 17.6 (H)  11.5 - 15.5 % Final   Platelets 02/18/2023 342  150 - 400 K/uL Final   nRBC 02/18/2023 0.0  0.0 - 0.2 % Final   Neutrophils Relative % 02/18/2023 39  % Final   Neutro Abs 02/18/2023 3.6  1.7 - 7.7 K/uL Final   Lymphocytes Relative 02/18/2023 48  % Final   Lymphs Abs 02/18/2023 4.5 (H)  0.7 - 4.0 K/uL Final   Monocytes Relative 02/18/2023 6  % Final   Monocytes Absolute 02/18/2023 0.6  0.1 - 1.0 K/uL Final   Eosinophils  Relative 02/18/2023 6  % Final   Eosinophils Absolute 02/18/2023 0.6 (H)  0.0 - 0.5 K/uL Final   Basophils Relative 02/18/2023 1  % Final   Basophils Absolute 02/18/2023 0.1  0.0 - 0.1 K/uL Final   Immature Granulocytes 02/18/2023 0  % Final   Abs Immature Granulocytes 02/18/2023 0.03  0.00 - 0.07 K/uL Final   Performed at [facility], [address] [place], [place] [address]   Sodium 02/18/2023 137  135 - 145 mmol/L Final   Potassium 02/18/2023 4.3  3.5 - 5.1 mmol/L Final   Chloride 02/18/2023 105  98 - 111 mmol/L Final   CO2 02/18/2023 20 (L)  22 - 32 mmol/L Final   Glucose, Bld 02/18/2023 168 (H)  70 - 99 mg/dL Final   Glucose reference range applies only to samples taken after fasting for at least 8 hours.   BUN 02/18/2023 <5 (L)  6 - 20 mg/dL Final   Creatinine, Ser 02/18/2023 0.80  0.61 - 1.24 mg/dL Final   Calcium 78/46/9629 9.1  8.9 - 10.3 mg/dL Final   Total Protein 52/84/1324 7.0  6.5 - 8.1 g/dL Final   Albumin 40/04/2724 3.8  3.5 - 5.0 g/dL Final   AST 36/64/4034 86 (H)  15 - 41 U/L Final   ALT 02/18/2023 77 (H)  0 - 44 U/L Final   Alkaline Phosphatase 02/18/2023 54  38 - 126 U/L Final   Total Bilirubin 02/18/2023 1.1  0.3 - 1.2 mg/dL Final   GFR, Estimated 02/18/2023 >60  >60 mL/min Final   Comment: (NOTE) Calculated using the CKD-EPI Creatinine Equation (2021)    Anion gap 02/18/2023 12  5 - 15 Final   Performed at [facility], [address] [place], [place] [address]   Cholesterol 02/18/2023 224 (H)  0 - 200 mg/dL Final   Triglycerides 56/38/7564 315 (H)  <150 mg/dL Final   HDL 33/29/5188 37 (L)  >40 mg/dL Final   Total CHOL/HDL Ratio 02/18/2023 6.1  RATIO Final   VLDL 02/18/2023 63 (H)  0 - 40 mg/dL Final   LDL Cholesterol 02/18/2023 124 (H)  0 - 99 mg/dL Final   Comment:        Total Cholesterol/HDL:CHD Risk Coronary Heart Disease Risk Table                     Men   Women  1/2 Average Risk   3.4   3.3  Average Risk  5.0   4.4  2 X  Average Risk   9.6   7.1  3 X Average Risk  23.4   11.0        Use the calculated Patient Ratio above and the CHD Risk Table to determine the patient's CHD Risk.        ATP III CLASSIFICATION (LDL):  <100     mg/dL   Optimal  062-694  mg/dL   Near or Above                    Optimal  130-159  mg/dL   Borderline  854-627  mg/dL   High  >035     mg/dL   Very High Performed at [facility], [address] [place], [place] [address]    POC Amphetamine UR 02/18/2023 None Detected  NONE DETECTED (Cut Off Level 1000 ng/mL) Final   POC Secobarbital (BAR) 02/18/2023 None Detected  NONE DETECTED (Cut Off Level 300 ng/mL) Final   POC Buprenorphine (BUP) 02/18/2023 None Detected  NONE DETECTED (Cut Off Level 10 ng/mL) Final   POC Oxazepam (BZO) 02/18/2023 Positive (A)  NONE DETECTED (Cut Off Level 300 ng/mL) Final   POC Cocaine UR 02/18/2023 Positive (A)  NONE DETECTED (Cut Off Level 300 ng/mL) Final   POC Methamphetamine UR 02/18/2023 None Detected  NONE DETECTED (Cut Off Level 1000 ng/mL) Final   POC Morphine 02/18/2023 None Detected  NONE DETECTED (Cut Off Level 300 ng/mL) Final   POC Methadone UR 02/18/2023 None Detected  NONE DETECTED (Cut Off Level 300 ng/mL) Final   POC Oxycodone UR 02/18/2023 None Detected  NONE DETECTED (Cut Off Level 100 ng/mL) Final   POC Marijuana UR 02/18/2023 None Detected  NONE DETECTED (Cut Off Level 50 ng/mL) Final   Alcohol, Ethyl (B) 02/18/2023 180 (H)  <10 mg/dL Final   Comment: (NOTE) Lowest detectable limit for serum alcohol is 10 mg/dL.  For medical purposes only. Performed at [facility], [address], [place], [place] [address]   Admission on 01/31/2023, Discharged on 01/31/2023  Component Date Value Ref Range Status   WBC 01/31/2023 7.4  4.0 - 10.5 K/uL Final   RBC 01/31/2023 6.12 (H)  4.22 - 5.81 MIL/uL Final   Hemoglobin 01/31/2023 15.7  13.0 - 17.0 g/dL Final   HCT 37/16/9678 49.7  39.0 - 52.0 % Final   MCV 01/31/2023 81.2   80.0 - 100.0 fL Final   MCH 01/31/2023 25.7 (L)  26.0 - 34.0 pg Final   MCHC 01/31/2023 31.6  30.0 - 36.0 g/dL Final   RDW 93/81/0175 14.8  11.5 - 15.5 % Final   Platelets 01/31/2023 343  150 - 400 K/uL Final   nRBC 01/31/2023 0.0  0.0 - 0.2 % Final   Neutrophils Relative % 01/31/2023 52  % Final   Neutro Abs 01/31/2023 3.8  1.7 - 7.7 K/uL Final   Lymphocytes Relative 01/31/2023 37  % Final   Lymphs Abs 01/31/2023 2.8  0.7 - 4.0 K/uL Final   Monocytes Relative 01/31/2023 7  % Final   Monocytes Absolute 01/31/2023 0.5  0.1 - 1.0 K/uL Final   Eosinophils Relative 01/31/2023 3  % Final   Eosinophils Absolute 01/31/2023 0.2  0.0 - 0.5 K/uL Final   Basophils Relative 01/31/2023 1  % Final   Basophils Absolute 01/31/2023 0.1  0.0 - 0.1 K/uL Final   Immature Granulocytes 01/31/2023 0  % Final   Abs Immature Granulocytes 01/31/2023  0.02  0.00 - 0.07 K/uL Final   Performed at [facility], [address], [place], [place] [address]   Sodium 01/31/2023 136  135 - 145 mmol/L Final   Potassium 01/31/2023 3.7  3.5 - 5.1 mmol/L Final   Chloride 01/31/2023 102  98 - 111 mmol/L Final   CO2 01/31/2023 20 (L)  22 - 32 mmol/L Final   Glucose, Bld 01/31/2023 149 (H)  70 - 99 mg/dL Final   Glucose reference range applies only to samples taken after fasting for at least 8 hours.   BUN 01/31/2023 <5 (L)  6 - 20 mg/dL Final   Creatinine, Ser 01/31/2023 0.76  0.61 - 1.24 mg/dL Final   Calcium 60/45/4098 9.5  8.9 - 10.3 mg/dL Final   Total Protein 11/91/4782 7.2  6.5 - 8.1 g/dL Final   Albumin 95/62/1308 3.7  3.5 - 5.0 g/dL Final   AST 65/78/4696 38  15 - 41 U/L Final   ALT 01/31/2023 48 (H)  0 - 44 U/L Final   Alkaline Phosphatase 01/31/2023 47  38 - 126 U/L Final   Total Bilirubin 01/31/2023 0.5  0.3 - 1.2 mg/dL Final   GFR, Estimated 01/31/2023 >60  >60 mL/min Final   Comment: (NOTE) Calculated using the CKD-EPI Creatinine Equation (2021)    Anion gap 01/31/2023 14  5 - 15 Final   Performed at  [facility], [address], [place], [place] [address]   Alcohol, Ethyl (B) 01/31/2023 188 (H)  <10 mg/dL Final   Comment: (NOTE) Lowest detectable limit for serum alcohol is 10 mg/dL.  For medical purposes only. Performed at [facility], [address] [place], [place] [address]    Cholesterol 01/31/2023 218 (H)  0 - 200 mg/dL Final   Triglycerides 40/04/2724 192 (H)  <150 mg/dL Final   HDL 36/64/4034 33 (L)  >40 mg/dL Final   Total CHOL/HDL Ratio 01/31/2023 6.6  RATIO Final   VLDL 01/31/2023 38  0 - 40 mg/dL Final   LDL Cholesterol 01/31/2023 147 (H)  0 - 99 mg/dL Final   Comment:        Total Cholesterol/HDL:CHD Risk Coronary Heart Disease Risk Table                     Men   Women  1/2 Average Risk   3.4   3.3  Average Risk       5.0   4.4  2 X Average Risk   9.6   7.1  3 X Average Risk  23.4   11.0        Use the calculated Patient Ratio above and the CHD Risk Table to determine the patient's CHD Risk.        ATP III CLASSIFICATION (LDL):  <100     mg/dL   Optimal  742-595  mg/dL   Near or Above                    Optimal  130-159  mg/dL   Borderline  638-756  mg/dL   High  >433     mg/dL   Very High Performed at [facility], [address] [place], [place] [address]    TSH 01/31/2023 0.993  0.350 - 4.500 uIU/mL Final   Comment: Performed by a 3rd Generation assay with a functional sensitivity of <=0.01 uIU/mL. Performed at [facility], [address] [place], [place] [address]  POC Amphetamine UR 01/31/2023 None Detected  NONE DETECTED (Cut Off Level 1000 ng/mL) Final   POC Secobarbital (BAR) 01/31/2023 None Detected  NONE DETECTED (Cut Off Level 300 ng/mL) Final   POC Buprenorphine (BUP) 01/31/2023 None Detected  NONE DETECTED (Cut Off Level 10 ng/mL) Final   POC Oxazepam (BZO) 01/31/2023 None Detected  NONE DETECTED (Cut Off Level 300 ng/mL) Final   POC Cocaine UR 01/31/2023 None Detected  NONE DETECTED (Cut Off Level 300 ng/mL)  Final   POC Methamphetamine UR 01/31/2023 None Detected  NONE DETECTED (Cut Off Level 1000 ng/mL) Final   POC Morphine 01/31/2023 None Detected  NONE DETECTED (Cut Off Level 300 ng/mL) Final   POC Methadone UR 01/31/2023 None Detected  NONE DETECTED (Cut Off Level 300 ng/mL) Final   POC Oxycodone UR 01/31/2023 None Detected  NONE DETECTED (Cut Off Level 100 ng/mL) Final   POC Marijuana UR 01/31/2023 None Detected  NONE DETECTED (Cut Off Level 50 ng/mL) Final  Admission on 01/24/2023, Discharged on 01/24/2023  Component Date Value Ref Range Status   Sodium 01/24/2023 135  135 - 145 mmol/L Final   Potassium 01/24/2023 3.8  3.5 - 5.1 mmol/L Final   Chloride 01/24/2023 104  98 - 111 mmol/L Final   CO2 01/24/2023 20 (L)  22 - 32 mmol/L Final   Glucose, Bld 01/24/2023 134 (H)  70 - 99 mg/dL Final   Glucose reference range applies only to samples taken after fasting for at least 8 hours.   BUN 01/24/2023 <5 (L)  6 - 20 mg/dL Final   Creatinine, Ser 01/24/2023 0.73  0.61 - 1.24 mg/dL Final   Calcium 03/47/4259 8.9  8.9 - 10.3 mg/dL Final   Total Protein 56/38/7564 7.5  6.5 - 8.1 g/dL Final   Albumin 33/29/5188 4.0  3.5 - 5.0 g/dL Final   AST 41/66/0630 47 (H)  15 - 41 U/L Final   ALT 01/24/2023 57 (H)  0 - 44 U/L Final   Alkaline Phosphatase 01/24/2023 52  38 - 126 U/L Final   Total Bilirubin 01/24/2023 0.8  0.3 - 1.2 mg/dL Final   GFR, Estimated 01/24/2023 >60  >60 mL/min Final   Comment: (NOTE) Calculated using the CKD-EPI Creatinine Equation (2021)    Anion gap 01/24/2023 11  5 - 15 Final   Performed at [facility], [address] [place], [place] [address]   WBC 01/24/2023 6.8  4.0 - 10.5 K/uL Final   RBC 01/24/2023 5.97 (H)  4.22 - 5.81 MIL/uL Final   Hemoglobin 01/24/2023 15.0  13.0 - 17.0 g/dL Final   HCT 93/23/5573 47.8  39.0 - 52.0 % Final   MCV 01/24/2023 80.1  80.0 - 100.0 fL Final   MCH 01/24/2023 25.1 (L)  26.0 - 34.0 pg Final   MCHC 01/24/2023 31.4  30.0 - 36.0 g/dL  Final   RDW 22/08/5425 15.4  11.5 - 15.5 % Final   Platelets 01/24/2023 331  150 - 400 K/uL Final   nRBC 01/24/2023 0.0  0.0 - 0.2 % Final   Neutrophils Relative % 01/24/2023 48  % Final   Neutro Abs 01/24/2023 3.3  1.7 - 7.7 K/uL Final   Lymphocytes Relative 01/24/2023 35  % Final   Lymphs Abs 01/24/2023 2.3  0.7 - 4.0 K/uL Final   Monocytes Relative 01/24/2023 10  % Final   Monocytes Absolute 01/24/2023 0.7  0.1 - 1.0 K/uL Final   Eosinophils Relative 01/24/2023 6  % Final   Eosinophils  Absolute 01/24/2023 0.4  0.0 - 0.5 K/uL Final   Basophils Relative 01/24/2023 1  % Final   Basophils Absolute 01/24/2023 0.0  0.0 - 0.1 K/uL Final   Immature Granulocytes 01/24/2023 0  % Final   Abs Immature Granulocytes 01/24/2023 0.02  0.00 - 0.07 K/uL Final   Performed at [facility], [address] [place], [place] [address]   Troponin I (High Sensitivity) 01/24/2023 4  <18 ng/L Final   Comment: (NOTE) Elevated high sensitivity troponin I (hsTnI) values and significant  changes across serial measurements may suggest ACS but many other  chronic and acute conditions are known to elevate hsTnI results.  Refer to the "Links" section for chest pain algorithms and additional  guidance. Performed at [facility], [address] [place], [place] [address]    Total CK 01/24/2023 610 (H)  49 - 397 U/L Final   Performed at [facility], [address], [place], [place] [address]   Magnesium 01/24/2023 2.0  1.7 - 2.4 mg/dL Final   Performed at [facility], [address] [place], [place] [address]   Troponin I (High Sensitivity) 01/24/2023 4  <18 ng/L Final   Comment: (NOTE) Elevated high sensitivity troponin I (hsTnI) values and significant  changes across serial measurements may suggest ACS but many other  chronic and acute conditions are known to elevate hsTnI results.  Refer to the "Links" section for chest pain algorithms and additional  guidance. Performed at [facility], [address] [place], [place] [address]   Admission on 01/06/2023, Discharged on 01/06/2023  Component Date Value Ref Range Status   Sodium 01/06/2023 138  135 - 145 mmol/L Final   Potassium 01/06/2023 3.2 (L)  3.5 - 5.1 mmol/L Final   Chloride 01/06/2023 105  98 - 111 mmol/L Final   CO2 01/06/2023 22  22 - 32 mmol/L Final   Glucose, Bld 01/06/2023 143 (H)  70 - 99 mg/dL Final   Glucose reference range applies only to samples taken after fasting for at least 8 hours.   BUN 01/06/2023 <5 (L)  6 - 20 mg/dL Final   Creatinine, Ser 01/06/2023 0.68  0.61 - 1.24 mg/dL Final   Calcium 13/02/6577 8.6 (L)  8.9 - 10.3 mg/dL Final   GFR, Estimated 01/06/2023 >60  >60 mL/min Final   Comment: (NOTE) Calculated using the CKD-EPI Creatinine Equation (2021)    Anion gap 01/06/2023 11  5 - 15 Final   Performed at [facility], [address] [place], [place] [address]   Troponin I (High Sensitivity) 01/06/2023 2  <18 ng/L Final   Comment: (NOTE) Elevated high sensitivity troponin I (hsTnI) values and significant  changes across serial measurements may suggest ACS but many other  chronic and acute conditions are known to elevate hsTnI results.  Refer to the "Links" section for chest pain algorithms and additional  guidance. Performed at [facility], [address] [place], [place] [address]    WBC 01/06/2023 7.1  4.0 - 10.5 K/uL Final   RBC 01/06/2023 5.63  4.22 - 5.81 MIL/uL Final   Hemoglobin 01/06/2023 14.4  13.0 - 17.0 g/dL Final   HCT 13/24/4010 45.0  39.0 - 52.0 % Final   MCV 01/06/2023 79.9 (L)  80.0 - 100.0 fL Final   MCH 01/06/2023 25.6 (L)  26.0 - 34.0 pg Final   MCHC 01/06/2023 32.0  30.0 - 36.0 g/dL  Final   RDW 01/06/2023 15.7 (H)  11.5 - 15.5 % Final   Platelets 01/06/2023 371  150 - 400 K/uL Final   nRBC 01/06/2023 0.0  0.0 - 0.2 % Final   Neutrophils Relative % 01/06/2023 67  % Final   Neutro Abs 01/06/2023 4.6  1.7 - 7.7 K/uL Final    Lymphocytes Relative 01/06/2023 25  % Final   Lymphs Abs 01/06/2023 1.8  0.7 - 4.0 K/uL Final   Monocytes Relative 01/06/2023 7  % Final   Monocytes Absolute 01/06/2023 0.5  0.1 - 1.0 K/uL Final   Eosinophils Relative 01/06/2023 1  % Final   Eosinophils Absolute 01/06/2023 0.1  0.0 - 0.5 K/uL Final   Basophils Relative 01/06/2023 0  % Final   Basophils Absolute 01/06/2023 0.0  0.0 - 0.1 K/uL Final   Immature Granulocytes 01/06/2023 0  % Final   Abs Immature Granulocytes 01/06/2023 0.02  0.00 - 0.07 K/uL Final   Performed at [facility], [address] [place], [place] [address]   Troponin I (High Sensitivity) 01/06/2023 2  <18 ng/L Final   Comment: (NOTE) Elevated high sensitivity troponin I (hsTnI) values and significant  changes across serial measurements may suggest ACS but many other  chronic and acute conditions are known to elevate hsTnI results.  Refer to the "Links" section for chest pain algorithms and additional  guidance. Performed at [facility], [address] [place], [place] [address]     Blood Alcohol level:  Lab Results  Component Value Date   ETH 180 (H) 02/18/2023   ETH 188 (H) 01/31/2023    Metabolic Disorder Labs: No results found for: "HGBA1C", "MPG" No results found for: "PROLACTIN" Lab Results  Component Value Date   CHOL 224 (H) 02/18/2023   TRIG 315 (H) 02/18/2023   HDL 37 (L) 02/18/2023   CHOLHDL 6.1 02/18/2023   VLDL 63 (H) 02/18/2023   LDLCALC 124 (H) 02/18/2023   LDLCALC 147 (H) 01/31/2023    Therapeutic Lab Levels: No results found for: "LITHIUM" No results found for: "VALPROATE" No results found for: "CBMZ"  Physical Findings   AUDIT    Flowsheet Row ED from 01/31/2023 in [facility]  Alcohol Use Disorder Identification Test Final Score (AUDIT) 29      PHQ2-9    Flowsheet Row ED from 01/31/2023 in [facility] Most recent reading at 01/31/2023  3:56  AM ED from 01/31/2023 in [facility] Most recent reading at 01/31/2023  3:00 AM  PHQ-2 Total Score 2 2  PHQ-9 Total Score 9 9      Flowsheet Row ED from 02/18/2023 in [facility] Most recent reading at 02/18/2023 12:04 PM ED from 02/18/2023 in [facility] Most recent reading at 02/18/2023  2:17 AM ED from 02/17/2023 in [facility] Most recent reading at 02/17/2023  5:46 PM  C-SSRS RISK CATEGORY No Risk No Risk No Risk        Musculoskeletal  Strength & Muscle Tone: within normal limits Gait & Station: normal Patient leans: N/A  Psychiatric Specialty Exam  Presentation  General Appearance:  Appropriate for Environment  Eye Contact: Good; Fair  Speech: Clear and Coherent  Speech Volume: Normal  Handedness: Right   Mood and Affect  Mood: Euthymic  Affect: Congruent; Appropriate   Thought Process  Thought Processes: Coherent; Goal Directed; Linear  Descriptions of  Associations:Intact  Orientation:Full (Time, Place and Person)  Thought Content:WDL  Diagnosis of Schizophrenia or Schizoaffective disorder in past: No    Hallucinations:Hallucinations: None  Ideas of Reference:None  Suicidal Thoughts:Suicidal Thoughts: No  Homicidal Thoughts:Homicidal Thoughts: No   Sensorium  Memory: Immediate Good; Recent Good; Remote Good  Judgment: Fair  Insight: Fair   Chartered certified accountant: Fair  Attention Span: Fair  Recall: Fair  Fund of Knowledge: Fair  Language: Fair   Psychomotor Activity  Psychomotor Activity: Psychomotor Activity: Normal   Assets  Assets: Desire for Improvement; Resilience; Physical Health   Sleep  Sleep: Sleep: Fair (Improving)     Physical Exam  Physical Exam Vitals reviewed.  Constitutional:      General: He is not in acute distress.    Appearance: He is obese.     Comments:  Appears tired  HENT:     Head: Normocephalic and atraumatic.  Eyes:     Pupils: Pupils are equal, round, and reactive to light.  Pulmonary:     Effort: Pulmonary effort is normal.  Abdominal:     General: There is distension.     Palpations: Abdomen is soft.  Musculoskeletal:        General: Normal range of motion.  Skin:    General: Skin is warm and dry.  Neurological:     Mental Status: He is oriented to person, place, and time. Mental status is at baseline.  Psychiatric:        Mood and Affect: Mood normal.        Behavior: Behavior normal.    Review of Systems  Psychiatric/Behavioral:  Positive for depression and substance abuse. Negative for hallucinations and suicidal ideas. The patient has insomnia. The patient is not nervous/anxious.   All other systems reviewed and are negative.  Blood pressure 121/86, pulse 60, temperature 98.2 F (36.8 C), temperature source Oral, resp. rate 20, SpO2 100%. There is no height or weight on file to calculate BMI.  Treatment Plan Summary:  Assessment:  Patient is a 32 year old male with a past psychiatric history of alcohol use disorder, severe who re-presents seeking alcohol detox.    Patient appears at psychiatric baseline this morning.  Less talkative than yesterday.  No new concerns.  It appears that his sleep is becoming more regulated in the setting of melatonin and increase trazodone dose.  Primary barrier to disposition is coordinating logistics of where he will go.  Provided number to [facility], patient will make a call today.  We will discontinue CIWA as last three have been 0.  Finishing Ativan taper today.  Does not appear to be undergoing acute withdrawal at this time.  Blood pressure within normal limits.  Began acamprosate 3 times a day for alcohol cravings yesterday -- will continue to monitor.  Mentioned toothache, prescribed Orajel.  Plan:  # Alcohol use disorder, severe: ongoing.    # Stimulant use disorder, severe,  cocaine type: ongoing. # CIWA Ativan protocol:  -- Discontinue CIWA, last CIWAs 0, 0, 0 - Continue lorazepam (Ativan) 1 mg 4 times daily x4 doses, 1 mg 3 times daily x3 doses, 1 mg 2 times daily x2 doses, 1 mg daily x1 dose - Continue lorazepam (Ativan) 1 mg every 6 hours as needed for CIWA greater than 10; Hydroxyzine 25 mg for CIWA less than 10 - Continue multivitamin with minerals 1 tablet daily. - Continue ondansetron disintegrating tablet 4 mg every 6 as needed/nausea or vomiting. - Continue loperamide 2 to 4  mg oral as needed/diarrhea or loose stools. - Given thiamine injection 100 mg IM once. - Continue thiamine tablet 500 mg Q8 hours. - Encourage patient to participate in therapeutic milieu while on unit. - Begin acamprosate 666 mg 3 times daily for alcohol cravings.  # Toothache: - Gave Orajel x 1.  Will continue to monitor.   # Recent history of trauma: Ongoing. Collateral call with wife indicates of substance use is deeply tied to recent traumatic experiences involving the death of multiple family members. - Will discuss options for outpatient psychiatric referral, and close medical follow-up.   # Tobacco use disorder: Ongoing. Patient vapes nicotine at home. - Discontinued NicoDerm patch 14 mg.  - Continue nicotine gum 4 mg every 6 hours while awake.   # History of myocardial infarction: ongoing.  EKG 7/21 indicates left atrial enlargement, no acute concern for MI.  No change from 7/3. # Concern for OSA: ongoing.  BMI 43.  Patient snores loudly on the unit. # Insomnia: Resolving. # Hypertension: Resolved.  # Concern for pre-diabetes: ongoing.  Glucose 168.  Elevated lipids. - Continue awaiting A1c results. - Continue melatonin 3 mg nightly for insomnia. - Increase trazodone to 100 mg as needed nightly.    # Disposition: Pending, likely LOS 1-2 days.  Tomie China, MD 02/22/2023 8:23 AM

## 2023-02-22 NOTE — ED Notes (Signed)
Ativan was given at 930 but was charted under ciwa instead of 1000 dose choose wrong option.

## 2023-02-22 NOTE — ED Notes (Signed)
Patient requesting for tooth ache.

## 2023-02-23 NOTE — ED Notes (Signed)
Patient in milieu. Environment is secured. Will continue to monitor for safety. 

## 2023-02-23 NOTE — Group Note (Signed)
Group Topic: Wellness  Group Date: 02/23/2023 Start Time: 1955 End Time: 2045 Facilitators: Emmit Pomfret D, NT  Department: [facility]  Number of Participants: 7  Group Focus: relaxation Treatment Modality:  Psychoeducation Interventions utilized were leisure development Purpose: Relaxation  Name: KYPTON ARCIA Date of Birth: September 15, 1990  MR: [id number]    Level of Participation: moderate Quality of Participation: attentive Interactions with others: gave feedback Mood/Affect: appropriate Triggers (if applicable): n/a Cognition: concrete Progress: Significant Response: n/a Plan: follow-up needed  Patients Problems:  Patient Active Problem List   Diagnosis Date Noted   Stimulant use disorder 02/18/2023   Substance induced mood disorder (HCC) 02/18/2023   Nicotine use disorder 02/18/2023   Alcohol use disorder 01/31/2023

## 2023-02-23 NOTE — Progress Notes (Signed)
72 Hour Document Signed Note  Patient Details Name: [name] MRN: [id number] DOB: [birth year] Today's Date: 02/23/2023   72 Hour Signed Documentation:  Admission Status: Voluntary/72 hour document signed Date 72 hour document signed : 02/22/23 Time 72 hour document signed : [id number] Provider Notified (First and Last Name) (see details for [facility] to note): tina allen np    Merrie Roof 02/23/2023, 7:28 AM

## 2023-02-23 NOTE — ED Notes (Signed)
Patient  sleeping in no acute stress. RR even and unlabored .Environment secured .Will continue to monitor for safely. 

## 2023-02-23 NOTE — ED Provider Notes (Cosign Needed Addendum)
Behavioral Health Progress Note  Date and Time: 02/23/2023 9:50 AM Name: [name] MRN:  [id number]  Subjective: Vitals within normal limits.  No acute events overnight.  Awaiting A1c.  Refused nicotine Gum x 2.  PRNs: Trazodone 100 mg x 1.  Signed 72-hour Stayform yesterday.  Still under review for [employer].  On interview, patient is euthymic, appropriate, and invested in conversation.  Asked what is going on, patient replied "nothing too much."  Talked with sponsor yesterday who is currently working at [employer].  Patient said that this facility does conduct transport on weekends.  Patient said he signed a 72-hour form after he got frustrated yesterday, but he feels better now.  "I don't know why I get so angry, man."  Will be calling today about housing directly.  Patient asked if it would be possible to arrange transport to his house to see his children before going to [facility].  He is deciding between direct transport and arranging for wife to drive patient.  Asked about sleep, patient said "I sleep too much during the day."  Discussed goal of staying awake during the day when possible for better uninterrupted night sleep.  Patient said that his sleep habits are interrupted by his regular cocaine and alcohol use, and that he slept for "2 straight days"  when he came here.  Sleeping somewhat better now.  Eating well.  Denies SI, HI, and AVH.  No new concerns.  [facility] never received a fax since 2 days ago.  We refiling paperwork today.  Per patient, this facility provides transport on weekends.  Patient has agreed to stay until tomorrow.  He expresses improved insight, saying that if he were to be discharged home he would likely return to drinking.  Also expressed interest in starting Antabuse.  Patient was repeatedly made aware of the potential medical consequences of drinking alcohol while taking Antabuse.  He was counseled extensively that involving family to ensure that he takes  the pill every day drastically increases adherence and, therefore abstinence.  Later in afternoon, patient said that he desired to be discharged.  Agreed to discharge morning of 7/27.  Patient will be given information to follow up with [facility] after discharge.  On call to grandmother [phone], she is amenable to patient coming home: "I'll take a chance."  Denies concerns about safety.  Denies guns at home.  Instructed grandmother on principles of Antabuse, including not cooking with alcohol.  Told her that Antabuse is most effective if family member is present when pill is taken.  She seemed somewhat resigned, but hopeful he will be taken in by [facility] after discharge.   Diagnosis:  Final diagnoses:  Alcohol use disorder  Stimulant use disorder  Substance induced mood disorder (HCC)  Nicotine use disorder    Total Time spent with patient: 15 minutes  Past Psychiatric History: Per chart review, no previous psychiatric diagnoses.  Never seen a psychiatrist or therapist.  Not taking any psychiatric medications.  No past trials. Past Medical History: Per patient, had MI a few years ago years ago.  Related to cocaine use.  Concern for OSA.  On last admission 2 weeks ago, advised patient to seek sleep study. Family History: Multiple family members with schizophrenia. Family Psychiatric  History: None pertinent. Social History: Lives with grandmother. 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 Married. Drinks 2-3 40 oz beers daily and sometimes hard alcohol. Uses cocaine  approx 2 days per week.   Additional Social History:                         Sleep: Poor  Appetite: Normal  Current Medications:  Current Facility-Administered Medications  Medication Dose Route Frequency Provider Last Rate Last Admin   acamprosate (CAMPRAL) tablet 666 mg  666 mg Oral TID Tomie China, MD   666 mg at 02/22/23 2123    loperamide (IMODIUM) capsule 2-4 mg  2-4 mg Oral PRN Tomie China, MD       LORazepam (ATIVAN) tablet 1 mg  1 mg Oral Daily Lamar Sprinkles, MD       melatonin tablet 3 mg  3 mg Oral QHS Tomie China, MD   3 mg at 02/22/23 2123   multivitamin with minerals tablet 1 tablet  1 tablet Oral Daily Lamar Sprinkles, MD   1 tablet at 02/22/23 9562   nicotine polacrilex (NICORETTE) gum 4 mg  4 mg Oral Q4H while awake Tomie China, MD   4 mg at 02/22/23 2122   ondansetron (ZOFRAN-ODT) disintegrating tablet 4 mg  4 mg Oral Q6H PRN Tomie China, MD       thiamine (VITAMIN B1) tablet 100 mg  100 mg Oral Daily Lamar Sprinkles, MD   100 mg at 02/22/23 1308   traZODone (DESYREL) tablet 100 mg  100 mg Oral QHS PRN Tomie China, MD   100 mg at 02/22/23 2123   No current outpatient medications on file.    Labs  Lab Results:  Admission on 02/18/2023, Discharged on 02/18/2023  Component Date Value Ref Range Status   WBC 02/18/2023 9.3  4.0 - 10.5 K/uL Final   RBC 02/18/2023 5.80  4.22 - 5.81 MIL/uL Final   Hemoglobin 02/18/2023 14.9  13.0 - 17.0 g/dL Final   HCT 65/78/4696 47.3  39.0 - 52.0 % Final   MCV 02/18/2023 81.6  80.0 - 100.0 fL Final   MCH 02/18/2023 25.7 (L)  26.0 - 34.0 pg Final   MCHC 02/18/2023 31.5  30.0 - 36.0 g/dL Final   RDW 29/52/8413 17.6 (H)  11.5 - 15.5 % Final   Platelets 02/18/2023 342  150 - 400 K/uL Final   nRBC 02/18/2023 0.0  0.0 - 0.2 % Final   Neutrophils Relative % 02/18/2023 39  % Final   Neutro Abs 02/18/2023 3.6  1.7 - 7.7 K/uL Final   Lymphocytes Relative 02/18/2023 48  % Final   Lymphs Abs 02/18/2023 4.5 (H)  0.7 - 4.0 K/uL Final   Monocytes Relative 02/18/2023 6  % Final   Monocytes Absolute 02/18/2023 0.6  0.1 - 1.0 K/uL Final   Eosinophils Relative 02/18/2023 6  % Final   Eosinophils Absolute 02/18/2023 0.6 (H)  0.0 - 0.5 K/uL Final   Basophils Relative 02/18/2023 1  % Final   Basophils Absolute 02/18/2023 0.1  0.0 - 0.1 K/uL Final    Immature Granulocytes 02/18/2023 0  % Final   Abs Immature Granulocytes 02/18/2023 0.03  0.00 - 0.07 K/uL Final   Performed at [facility], [address], [place], [place] [address]   Sodium 02/18/2023 137  135 - 145 mmol/L Final   Potassium 02/18/2023 4.3  3.5 - 5.1 mmol/L Final   Chloride 02/18/2023 105  98 - 111 mmol/L Final   CO2 02/18/2023 20 (L)  22 - 32 mmol/L Final   Glucose, Bld 02/18/2023 168 (H)  70 - 99 mg/dL Final  Glucose reference range applies only to samples taken after fasting for at least 8 hours.   BUN 02/18/2023 <5 (L)  6 - 20 mg/dL Final   Creatinine, Ser 02/18/2023 0.80  0.61 - 1.24 mg/dL Final   Calcium 16/05/9603 9.1  8.9 - 10.3 mg/dL Final   Total Protein 54/03/8118 7.0  6.5 - 8.1 g/dL Final   Albumin 14/78/2956 3.8  3.5 - 5.0 g/dL Final   AST 21/30/8657 86 (H)  15 - 41 U/L Final   ALT 02/18/2023 77 (H)  0 - 44 U/L Final   Alkaline Phosphatase 02/18/2023 54  38 - 126 U/L Final   Total Bilirubin 02/18/2023 1.1  0.3 - 1.2 mg/dL Final   GFR, Estimated 02/18/2023 >60  >60 mL/min Final   Comment: (NOTE) Calculated using the CKD-EPI Creatinine Equation (2021)    Anion gap 02/18/2023 12  5 - 15 Final   Performed at [facility], [address] [place], [place] [address]   Cholesterol 02/18/2023 224 (H)  0 - 200 mg/dL Final   Triglycerides 29/52/8413 315 (H)  <150 mg/dL Final   HDL 24/40/1027 37 (L)  >40 mg/dL Final   Total CHOL/HDL Ratio 02/18/2023 6.1  RATIO Final   VLDL 02/18/2023 63 (H)  0 - 40 mg/dL Final   LDL Cholesterol 02/18/2023 124 (H)  0 - 99 mg/dL Final   Comment:        Total Cholesterol/HDL:CHD Risk Coronary Heart Disease Risk Table                     Men   Women  1/2 Average Risk   3.4   3.3  Average Risk       5.0   4.4  2 X Average Risk   9.6   7.1  3 X Average Risk  23.4   11.0        Use the calculated Patient Ratio above and the CHD Risk Table to determine the patient's CHD Risk.        ATP III CLASSIFICATION  (LDL):  <100     mg/dL   Optimal  253-664  mg/dL   Near or Above                    Optimal  130-159  mg/dL   Borderline  403-474  mg/dL   High  >259     mg/dL   Very High Performed at [facility], [address], [place], [place] [address]    POC Amphetamine UR 02/18/2023 None Detected  NONE DETECTED (Cut Off Level 1000 ng/mL) Final   POC Secobarbital (BAR) 02/18/2023 None Detected  NONE DETECTED (Cut Off Level 300 ng/mL) Final   POC Buprenorphine (BUP) 02/18/2023 None Detected  NONE DETECTED (Cut Off Level 10 ng/mL) Final   POC Oxazepam (BZO) 02/18/2023 Positive (A)  NONE DETECTED (Cut Off Level 300 ng/mL) Final   POC Cocaine UR 02/18/2023 Positive (A)  NONE DETECTED (Cut Off Level 300 ng/mL) Final   POC Methamphetamine UR 02/18/2023 None Detected  NONE DETECTED (Cut Off Level 1000 ng/mL) Final   POC Morphine 02/18/2023 None Detected  NONE DETECTED (Cut Off Level 300 ng/mL) Final   POC Methadone UR 02/18/2023 None Detected  NONE DETECTED (Cut Off Level 300 ng/mL) Final   POC Oxycodone UR 02/18/2023 None Detected  NONE DETECTED (Cut Off Level 100 ng/mL) Final   POC Marijuana UR 02/18/2023 None Detected  NONE DETECTED (Cut  Off Level 50 ng/mL) Final   Alcohol, Ethyl (B) 02/18/2023 180 (H)  <10 mg/dL Final   Comment: (NOTE) Lowest detectable limit for serum alcohol is 10 mg/dL.  For medical purposes only. Performed at [facility], [address] [place], [place] [address]   Admission on 01/31/2023, Discharged on 01/31/2023  Component Date Value Ref Range Status   WBC 01/31/2023 7.4  4.0 - 10.5 K/uL Final   RBC 01/31/2023 6.12 (H)  4.22 - 5.81 MIL/uL Final   Hemoglobin 01/31/2023 15.7  13.0 - 17.0 g/dL Final   HCT 36/64/4034 49.7  39.0 - 52.0 % Final   MCV 01/31/2023 81.2  80.0 - 100.0 fL Final   MCH 01/31/2023 25.7 (L)  26.0 - 34.0 pg Final   MCHC 01/31/2023 31.6  30.0 - 36.0 g/dL Final   RDW 74/25/9563 14.8  11.5 - 15.5 % Final   Platelets 01/31/2023 343  150 - 400  K/uL Final   nRBC 01/31/2023 0.0  0.0 - 0.2 % Final   Neutrophils Relative % 01/31/2023 52  % Final   Neutro Abs 01/31/2023 3.8  1.7 - 7.7 K/uL Final   Lymphocytes Relative 01/31/2023 37  % Final   Lymphs Abs 01/31/2023 2.8  0.7 - 4.0 K/uL Final   Monocytes Relative 01/31/2023 7  % Final   Monocytes Absolute 01/31/2023 0.5  0.1 - 1.0 K/uL Final   Eosinophils Relative 01/31/2023 3  % Final   Eosinophils Absolute 01/31/2023 0.2  0.0 - 0.5 K/uL Final   Basophils Relative 01/31/2023 1  % Final   Basophils Absolute 01/31/2023 0.1  0.0 - 0.1 K/uL Final   Immature Granulocytes 01/31/2023 0  % Final   Abs Immature Granulocytes 01/31/2023 0.02  0.00 - 0.07 K/uL Final   Performed at [facility], [address] [place], [place] [address]   Sodium 01/31/2023 136  135 - 145 mmol/L Final   Potassium 01/31/2023 3.7  3.5 - 5.1 mmol/L Final   Chloride 01/31/2023 102  98 - 111 mmol/L Final   CO2 01/31/2023 20 (L)  22 - 32 mmol/L Final   Glucose, Bld 01/31/2023 149 (H)  70 - 99 mg/dL Final   Glucose reference range applies only to samples taken after fasting for at least 8 hours.   BUN 01/31/2023 <5 (L)  6 - 20 mg/dL Final   Creatinine, Ser 01/31/2023 0.76  0.61 - 1.24 mg/dL Final   Calcium 33/29/5188 9.5  8.9 - 10.3 mg/dL Final   Total Protein 41/66/0630 7.2  6.5 - 8.1 g/dL Final   Albumin 16/07/930 3.7  3.5 - 5.0 g/dL Final   AST 35/57/3220 38  15 - 41 U/L Final   ALT 01/31/2023 48 (H)  0 - 44 U/L Final   Alkaline Phosphatase 01/31/2023 47  38 - 126 U/L Final   Total Bilirubin 01/31/2023 0.5  0.3 - 1.2 mg/dL Final   GFR, Estimated 01/31/2023 >60  >60 mL/min Final   Comment: (NOTE) Calculated using the CKD-EPI Creatinine Equation (2021)    Anion gap 01/31/2023 14  5 - 15 Final   Performed at [facility], [address], [place], [place] [address]   Alcohol, Ethyl (B) 01/31/2023 188 (H)  <10 mg/dL Final   Comment: (NOTE) Lowest detectable limit for serum alcohol is 10 mg/dL.  For  medical purposes only. Performed at [facility], [address], [place], [place] [address]    Cholesterol 01/31/2023 218 (H)  0 -  200 mg/dL Final   Triglycerides 29/56/2130 192 (H)  <150 mg/dL Final   HDL 86/57/8469 33 (L)  >40 mg/dL Final   Total CHOL/HDL Ratio 01/31/2023 6.6  RATIO Final   VLDL 01/31/2023 38  0 - 40 mg/dL Final   LDL Cholesterol 01/31/2023 147 (H)  0 - 99 mg/dL Final   Comment:        Total Cholesterol/HDL:CHD Risk Coronary Heart Disease Risk Table                     Men   Women  1/2 Average Risk   3.4   3.3  Average Risk       5.0   4.4  2 X Average Risk   9.6   7.1  3 X Average Risk  23.4   11.0        Use the calculated Patient Ratio above and the CHD Risk Table to determine the patient's CHD Risk.        ATP III CLASSIFICATION (LDL):  <100     mg/dL   Optimal  629-528  mg/dL   Near or Above                    Optimal  130-159  mg/dL   Borderline  413-244  mg/dL   High  >010     mg/dL   Very High Performed at [facility], [address], [place], [place] [address]    TSH 01/31/2023 0.993  0.350 - 4.500 uIU/mL Final   Comment: Performed by a 3rd Generation assay with a functional sensitivity of <=0.01 uIU/mL. Performed at [facility], [address], [place], [place] [address]    POC Amphetamine UR 01/31/2023 None Detected  NONE DETECTED (Cut Off Level 1000 ng/mL) Final   POC Secobarbital (BAR) 01/31/2023 None Detected  NONE DETECTED (Cut Off Level 300 ng/mL) Final   POC Buprenorphine (BUP) 01/31/2023 None Detected  NONE DETECTED (Cut Off Level 10 ng/mL) Final   POC Oxazepam (BZO) 01/31/2023 None Detected  NONE DETECTED (Cut Off Level 300 ng/mL) Final   POC Cocaine UR 01/31/2023 None Detected  NONE DETECTED (Cut Off Level 300 ng/mL) Final   POC Methamphetamine UR 01/31/2023 None Detected  NONE DETECTED (Cut Off Level 1000 ng/mL) Final   POC Morphine 01/31/2023 None Detected  NONE DETECTED (Cut Off Level 300 ng/mL) Final   POC  Methadone UR 01/31/2023 None Detected  NONE DETECTED (Cut Off Level 300 ng/mL) Final   POC Oxycodone UR 01/31/2023 None Detected  NONE DETECTED (Cut Off Level 100 ng/mL) Final   POC Marijuana UR 01/31/2023 None Detected  NONE DETECTED (Cut Off Level 50 ng/mL) Final  Admission on 01/24/2023, Discharged on 01/24/2023  Component Date Value Ref Range Status   Sodium 01/24/2023 135  135 - 145 mmol/L Final   Potassium 01/24/2023 3.8  3.5 - 5.1 mmol/L Final   Chloride 01/24/2023 104  98 - 111 mmol/L Final   CO2 01/24/2023 20 (L)  22 - 32 mmol/L Final   Glucose, Bld 01/24/2023 134 (H)  70 - 99 mg/dL Final   Glucose reference range applies only to samples taken after fasting for at least 8 hours.   BUN 01/24/2023 <5 (L)  6 - 20 mg/dL Final   Creatinine, Ser 01/24/2023 0.73  0.61 - 1.24 mg/dL Final   Calcium 34/74/2595 8.9  8.9 - 10.3 mg/dL Final   Total Protein 63/87/5643 7.5  6.5 -  8.1 g/dL Final   Albumin 29/56/2130 4.0  3.5 - 5.0 g/dL Final   AST 86/57/8469 47 (H)  15 - 41 U/L Final   ALT 01/24/2023 57 (H)  0 - 44 U/L Final   Alkaline Phosphatase 01/24/2023 52  38 - 126 U/L Final   Total Bilirubin 01/24/2023 0.8  0.3 - 1.2 mg/dL Final   GFR, Estimated 01/24/2023 >60  >60 mL/min Final   Comment: (NOTE) Calculated using the CKD-EPI Creatinine Equation (2021)    Anion gap 01/24/2023 11  5 - 15 Final   Performed at [facility], [address], [place], [place] [address]   WBC 01/24/2023 6.8  4.0 - 10.5 K/uL Final   RBC 01/24/2023 5.97 (H)  4.22 - 5.81 MIL/uL Final   Hemoglobin 01/24/2023 15.0  13.0 - 17.0 g/dL Final   HCT 84/13/2440 47.8  39.0 - 52.0 % Final   MCV 01/24/2023 80.1  80.0 - 100.0 fL Final   MCH 01/24/2023 25.1 (L)  26.0 - 34.0 pg Final   MCHC 01/24/2023 31.4  30.0 - 36.0 g/dL Final   RDW 05/27/2535 15.4  11.5 - 15.5 % Final   Platelets 01/24/2023 331  150 - 400 K/uL Final   nRBC 01/24/2023 0.0  0.0 - 0.2 % Final   Neutrophils Relative % 01/24/2023 48  % Final   Neutro  Abs 01/24/2023 3.3  1.7 - 7.7 K/uL Final   Lymphocytes Relative 01/24/2023 35  % Final   Lymphs Abs 01/24/2023 2.3  0.7 - 4.0 K/uL Final   Monocytes Relative 01/24/2023 10  % Final   Monocytes Absolute 01/24/2023 0.7  0.1 - 1.0 K/uL Final   Eosinophils Relative 01/24/2023 6  % Final   Eosinophils Absolute 01/24/2023 0.4  0.0 - 0.5 K/uL Final   Basophils Relative 01/24/2023 1  % Final   Basophils Absolute 01/24/2023 0.0  0.0 - 0.1 K/uL Final   Immature Granulocytes 01/24/2023 0  % Final   Abs Immature Granulocytes 01/24/2023 0.02  0.00 - 0.07 K/uL Final   Performed at [facility], [address] [place], [place] [address]   Troponin I (High Sensitivity) 01/24/2023 4  <18 ng/L Final   Comment: (NOTE) Elevated high sensitivity troponin I (hsTnI) values and significant  changes across serial measurements may suggest ACS but many other  chronic and acute conditions are known to elevate hsTnI results.  Refer to the "Links" section for chest pain algorithms and additional  guidance. Performed at [facility], [address] [place], [place] [address]    Total CK 01/24/2023 610 (H)  49 - 397 U/L Final   Performed at [facility], [address], [place], [place] [address]   Magnesium 01/24/2023 2.0  1.7 - 2.4 mg/dL Final   Performed at [facility], [address], [place], [place] [address]   Troponin I (High Sensitivity) 01/24/2023 4  <18 ng/L Final   Comment: (NOTE) Elevated high sensitivity troponin I (hsTnI) values and significant  changes across serial measurements may suggest ACS but many other  chronic and acute conditions are known to elevate hsTnI results.  Refer to the "Links" section for chest pain algorithms and additional  guidance. Performed at [facility], [address] [place], [place] [address]   Admission on 01/06/2023, Discharged on 01/06/2023  Component Date Value Ref Range Status   Sodium 01/06/2023 138  135 - 145 mmol/L Final    Potassium 01/06/2023  3.2 (L)  3.5 - 5.1 mmol/L Final   Chloride 01/06/2023 105  98 - 111 mmol/L Final   CO2 01/06/2023 22  22 - 32 mmol/L Final   Glucose, Bld 01/06/2023 143 (H)  70 - 99 mg/dL Final   Glucose reference range applies only to samples taken after fasting for at least 8 hours.   BUN 01/06/2023 <5 (L)  6 - 20 mg/dL Final   Creatinine, Ser 01/06/2023 0.68  0.61 - 1.24 mg/dL Final   Calcium 16/05/9603 8.6 (L)  8.9 - 10.3 mg/dL Final   GFR, Estimated 01/06/2023 >60  >60 mL/min Final   Comment: (NOTE) Calculated using the CKD-EPI Creatinine Equation (2021)    Anion gap 01/06/2023 11  5 - 15 Final   Performed at [facility], [address] [place], [place] [address]   Troponin I (High Sensitivity) 01/06/2023 2  <18 ng/L Final   Comment: (NOTE) Elevated high sensitivity troponin I (hsTnI) values and significant  changes across serial measurements may suggest ACS but many other  chronic and acute conditions are known to elevate hsTnI results.  Refer to the "Links" section for chest pain algorithms and additional  guidance. Performed at [facility], [address] [place], [place] [address]    WBC 01/06/2023 7.1  4.0 - 10.5 K/uL Final   RBC 01/06/2023 5.63  4.22 - 5.81 MIL/uL Final   Hemoglobin 01/06/2023 14.4  13.0 - 17.0 g/dL Final   HCT 78/29/5621 45.0  39.0 - 52.0 % Final   MCV 01/06/2023 79.9 (L)  80.0 - 100.0 fL Final   MCH 01/06/2023 25.6 (L)  26.0 - 34.0 pg Final   MCHC 01/06/2023 32.0  30.0 - 36.0 g/dL Final   RDW 30/86/5784 15.7 (H)  11.5 - 15.5 % Final   Platelets 01/06/2023 371  150 - 400 K/uL Final   nRBC 01/06/2023 0.0  0.0 - 0.2 % Final   Neutrophils Relative % 01/06/2023 67  % Final   Neutro Abs 01/06/2023 4.6  1.7 - 7.7 K/uL Final   Lymphocytes Relative 01/06/2023 25  % Final   Lymphs Abs 01/06/2023 1.8  0.7 - 4.0 K/uL Final   Monocytes Relative 01/06/2023 7  % Final   Monocytes Absolute 01/06/2023 0.5  0.1 - 1.0 K/uL Final    Eosinophils Relative 01/06/2023 1  % Final   Eosinophils Absolute 01/06/2023 0.1  0.0 - 0.5 K/uL Final   Basophils Relative 01/06/2023 0  % Final   Basophils Absolute 01/06/2023 0.0  0.0 - 0.1 K/uL Final   Immature Granulocytes 01/06/2023 0  % Final   Abs Immature Granulocytes 01/06/2023 0.02  0.00 - 0.07 K/uL Final   Performed at [facility], [address] [place], [place] [address]   Troponin I (High Sensitivity) 01/06/2023 2  <18 ng/L Final   Comment: (NOTE) Elevated high sensitivity troponin I (hsTnI) values and significant  changes across serial measurements may suggest ACS but many other  chronic and acute conditions are known to elevate hsTnI results.  Refer to the "Links" section for chest pain algorithms and additional  guidance. Performed at [facility], [address] [place], [place] [address]     Blood Alcohol level:  Lab Results  Component Value Date   ETH 180 (H) 02/18/2023   ETH 188 (H) 01/31/2023    Metabolic Disorder Labs: No results found for: "HGBA1C", "MPG" No results found for: "PROLACTIN" Lab Results  Component Value Date   CHOL 224 (H) 02/18/2023   TRIG 315 (H) 02/18/2023   HDL 37 (L) 02/18/2023   CHOLHDL 6.1 02/18/2023   VLDL 63 (H) 02/18/2023   LDLCALC 124 (H) 02/18/2023   LDLCALC 147 (H) 01/31/2023    Therapeutic Lab Levels: No results found for: "LITHIUM" No results found for: "VALPROATE" No results found for: "CBMZ"  Physical Findings   AUDIT    Flowsheet Row ED from 01/31/2023 in [facility]  Alcohol Use Disorder Identification Test Final Score (AUDIT) 29      PHQ2-9    Flowsheet Row ED from 01/31/2023 in [facility] Most recent reading at 01/31/2023  3:56 AM ED from 01/31/2023 in [facility] Most recent reading at 01/31/2023  3:00 AM  PHQ-2 Total Score 2 2  PHQ-9 Total Score 9 9      Flowsheet Row ED from 02/18/2023 in  [facility] Most recent reading at 02/18/2023 12:04 PM ED from 02/18/2023 in [facility] Most recent reading at 02/18/2023  2:17 AM ED from 02/17/2023 in [facility] Most recent reading at 02/17/2023  5:46 PM  C-SSRS RISK CATEGORY No Risk No Risk No Risk        Musculoskeletal  Strength & Muscle Tone: within normal limits Gait & Station: normal Patient leans: N/A  Psychiatric Specialty Exam  Presentation  General Appearance:  Appropriate for Environment  Eye Contact: Good  Speech: Clear and Coherent  Speech Volume: Normal  Handedness: Right   Mood and Affect  Mood: Euthymic  Affect: Congruent; Appropriate   Thought Process  Thought Processes: Coherent; Goal Directed; Linear  Descriptions of Associations:Intact  Orientation:Full (Time, Place and Person)  Thought Content:WDL  Diagnosis of Schizophrenia or Schizoaffective disorder in past: No    Hallucinations:Hallucinations: None  Ideas of Reference:None  Suicidal Thoughts:Suicidal Thoughts: No  Homicidal Thoughts:Homicidal Thoughts: No   Sensorium  Memory: Immediate Good; Recent Good; Remote Good  Judgment: Fair  Insight: Fair   Chartered certified accountant: Fair  Attention Span: Fair  Recall: [employer] of Knowledge: Fair  Language: Fair   Psychomotor Activity  Psychomotor Activity: Psychomotor Activity: Normal   Assets  Assets: Desire for Improvement; Resilience; Physical Health   Sleep  Sleep: Sleep: Fair (Improving)     Physical Exam  Physical Exam Vitals reviewed.  Constitutional:      General: He is not in acute distress.    Appearance: He is obese.     Comments: Appears tired  HENT:     Head: Normocephalic and atraumatic.  Eyes:     Pupils: Pupils are equal, round, and reactive to light.  Pulmonary:     Effort: Pulmonary effort is normal.  Abdominal:      General: There is distension.     Palpations: Abdomen is soft.  Musculoskeletal:        General: Normal range of motion.  Skin:    General: Skin is warm and dry.  Neurological:     Mental Status: He is oriented to person, place, and time. Mental status is at baseline.  Psychiatric:        Mood and Affect: Mood normal.        Behavior: Behavior normal.   Review of Systems  Psychiatric/Behavioral:  Positive for depression and substance abuse. Negative for hallucinations and suicidal ideas. The patient has insomnia. The patient is not nervous/anxious.  All other systems reviewed and are negative.  Blood pressure 124/81, pulse 70, temperature 97.6 F (36.4 C), temperature source Oral, resp. rate 18, SpO2 99%. There is no height or weight on file to calculate BMI.  Treatment Plan Summary:  Assessment:  Patient is a 32 year old male with a past psychiatric history of alcohol use disorder, severe who re-presents seeking alcohol detox.    Patient in good spirits today.  Patient mentioned that he felt trapped yesterday, which prompted desire for discharge. Currently in the process of refiling forms to [employer].  Writer confirmed that the application is sent and currently in review.  Patient is medically and psychiatrically stable at this time, although somewhat frustrated at the delay.  Has agreed to stay until Saturday in order to be processed.  Unclear how long it will take to process him.  He is very anxious to go, despite knowing that discharged home will likely result in relapse. Managed to talk him into staying another day.  Plan:  # Alcohol use disorder, severe: ongoing.    # Stimulant use disorder, severe, cocaine type: ongoing. # CIWA Ativan protocol:  -- Discontinue CIWA, last CIWAs 0, 0, 0 - Continue lorazepam (Ativan) 1 mg 4 times daily x4 doses, 1 mg 3 times daily x3 doses, 1 mg 2 times daily x2 doses, 1 mg daily x1 dose - Continue lorazepam (Ativan) 1 mg every 6 hours as  needed for CIWA greater than 10; Hydroxyzine 25 mg for CIWA less than 10 - Continue multivitamin with minerals 1 tablet daily. - Continue ondansetron disintegrating tablet 4 mg every 6 as needed/nausea or vomiting. - Continue loperamide 2 to 4 mg oral as needed/diarrhea or loose stools. - Given thiamine injection 100 mg IM once. - Continue thiamine tablet 500 mg Q8 hours. - Encourage patient to participate in therapeutic milieu while on unit. - Continue acamprosate 666 mg 3 times daily for alcohol cravings. - Patient signed 72 hour form 7/25.  Writer spoke with attending physician Dr. Lucianne Muss, will discharge home tomorrow. Patient has expressed interest in Antabuse. Can discharge him home on Antabuse with further counseling on medical side effects/hazards when combined with alcohol. Would be a good idea to loop his wife in on this as well -- she could conceivably watch him take this pill every day to ensure his sobriety.  # Toothache: - Gave Orajel x 1.  Will continue to monitor.   # Tobacco use disorder: Ongoing. Patient vapes nicotine at home. - Discontinued NicoDerm patch 14 mg.  - Continue nicotine gum 4 mg every 6 hours while awake.   # History of myocardial infarction: ongoing.  EKG 7/21 indicates left atrial enlargement, no acute concern for MI.  No change from 7/3. # Concern for OSA: ongoing.  BMI 43.  Patient snores loudly on the unit. # Insomnia: Resolving. # Hypertension: Resolved.  # Concern for pre-diabetes: ongoing.  Glucose 168.  Elevated lipids. - Continue awaiting A1c results. - Continue melatonin 3 mg nightly for insomnia. - Continue trazodone to 100 mg as needed nightly.    # Disposition: Discharge Saturday morning  Tomie China, MD 02/23/2023 9:50 AM

## 2023-02-23 NOTE — ED Notes (Signed)
Pt is with other colleagues outside having groups. Respirations are even and unlabored. No acute distress noted. Will continue to monitor for safety.

## 2023-02-23 NOTE — ED Notes (Signed)
Patient alert and oriented x 3. Denies SI/HI/AVH. Denies intent or plan to harm self or others. Routine conducted according to faculty protocol. Encourage patient to notify staff with any needs or concerns. Patient verbalized agreement and understanding. Will continue to monitor for safety. 

## 2023-02-23 NOTE — Group Note (Signed)
Group Topic: Balance in Life  Group Date: 02/23/2023 Start Time: 1009 End Time: 1100 Facilitators: Priscille Kluver, NT  Department: [facility]  Number of Participants: 6  Group Focus: feeling awareness/expression Treatment Modality:  Skills Training Interventions utilized were support Purpose: enhance coping skills  Name: BRANDONLEE DONICA Date of Birth: 05/14/1991  MR: [id number]    Level of Participation: active Quality of Participation: cooperative Interactions with others: gave feedback Mood/Affect: appropriate Triggers (if applicable):  Cognition: insightful Progress: Gaining insight Response:  Plan: referral / recommendations  Patients Problems:  Patient Active Problem List   Diagnosis Date Noted   Stimulant use disorder 02/18/2023   Substance induced mood disorder (HCC) 02/18/2023   Nicotine use disorder 02/18/2023   Alcohol use disorder 01/31/2023

## 2023-02-23 NOTE — Discharge Instructions (Addendum)
[facility] [address], [place], [address] [phone] phone   New Patient Assessment/Therapy Walk-Ins:  Monday and Wednesday: 8 am until slots are full. Every 1st and 2nd Fridays of the month: 1 pm - 5 pm.  NO ASSESSMENT/THERAPY WALK-INS ON TUESDAYS OR THURSDAYS  New Patient Assessment/Medication Management Walk-Ins:  Monday - Friday:  8 am - 11 am.  For all walk-ins, we ask that you arrive by 7:00 am because patients will be seen in the order of arrival.  Availability is limited; therefore, you may not be seen on the same day that you walk-in.  Our goal is to serve and meet the needs of our community to the best of our ability.

## 2023-02-23 NOTE — ED Notes (Signed)
Pt is in the bed sleeping. Respirations are even and unlabored. No acute distress noted. Will continue to monitor for safety. 

## 2023-02-23 NOTE — ED Notes (Signed)
Patient is watching tv in the dayroom. Environment secured. Alert and oriented. Will continue to monitor for safety.

## 2023-02-23 NOTE — Group Note (Signed)
Group Topic: Communication  Group Date: 02/23/2023 Start Time: 1215 End Time: 1245 Facilitators: Merrie Roof, RN  Department: [facility]  Number of Participants: 6  Group Focus: safety plan Treatment Modality:  Individual Therapy Interventions utilized were assignment Purpose: express feelings  Name: [name] Date of Birth: September 05, 1990  MR: [id number]    Level of Participation:  Quality of Participation:  Interactions with others:  Mood/Affect:  Triggers (if applicable):  Cognition:  Progress:  Response:  Plan: did not attend group  Patients Problems:  Patient Active Problem List   Diagnosis Date Noted   Stimulant use disorder 02/18/2023   Substance induced mood disorder (HCC) 02/18/2023   Nicotine use disorder 02/18/2023   Alcohol use disorder 01/31/2023

## 2023-02-23 NOTE — Care Management (Signed)
Care Management   Writer emailed a referral to [employer],

## 2023-02-24 MED ORDER — NICOTINE POLACRILEX 4 MG MT GUM: 4.0000 mg | CHEWING_GUM | OROMUCOSAL | 0 refills | Status: AC

## 2023-02-24 MED ORDER — ACAMPROSATE CALCIUM 333 MG PO TBEC: 666.0000 mg | DELAYED_RELEASE_TABLET | Freq: Three times a day (TID) | ORAL | 0 refills | Status: AC

## 2023-02-24 NOTE — ED Notes (Signed)
Pt is in the bed sleeping. Respirations are even and unlabored. No acute distress noted. Will continue to monitor for safety. 

## 2023-02-24 NOTE — ED Provider Notes (Addendum)
[facility] ASAP Discharge Summary  Date and Time: 02/24/2023 9:15 AM  Name: [name]  MRN:  [id number]   Discharge Diagnoses:  Final diagnoses:  Alcohol use disorder  Stimulant use disorder  Substance induced mood disorder (HCC)  Nicotine use disorder    Subjective:  The patient is a 32 year old male with a past history of alcohol use disorder, no history of suicide attempts or inpatient admissions noted in the medical record.  He has not complained of any suicidal thoughts at admission or over the past several days.  He presented to the [facility] behavioral urgent care requesting alcohol use treatment.  Stay Summary:  Uneventful detox with Ativan taper.  Placement at [facility] was attempted but was unsuccessful.  The patient and our treatment team were unable to find a formal treatment program for the patient to enroll in at discharge despite a much longer stay than average.  The patient requested discharge on the evening of 7/26 and the morning of 7/27.  His plan is to stay with his grandmother and attend AA meetings.  On my interview today, he is future oriented and has a logical thought process.  He is calm and demonstrates some insight.  He denies experiencing any depression or suicidal thoughts.   Please note that the patient was started on Campral during this stay.  There was some consideration for switching to Antabuse before discharge but he was discharged on Campral.  Total Time spent with patient: 20 minutes  Past Psychiatric History: as above Past Medical History: as above Family History: none Family Psychiatric History: none Social History: as above Tobacco Cessation:  A prescription for an FDA-approved tobacco cessation medication provided at discharge   Current Medications:  Current Facility-Administered Medications  Medication Dose Route Frequency Provider Last Rate Last Admin   acamprosate (CAMPRAL) tablet 666 mg  666 mg Oral TID Tomie China,  MD   666 mg at 02/23/23 2123   loperamide (IMODIUM) capsule 2-4 mg  2-4 mg Oral PRN Tomie China, MD       melatonin tablet 3 mg  3 mg Oral QHS Tomie China, MD   3 mg at 02/22/23 2123   multivitamin with minerals tablet 1 tablet  1 tablet Oral Daily Lamar Sprinkles, MD   1 tablet at 02/23/23 1010   nicotine polacrilex (NICORETTE) gum 4 mg  4 mg Oral Q4H while awake Tomie China, MD   4 mg at 02/23/23 2123   ondansetron (ZOFRAN-ODT) disintegrating tablet 4 mg  4 mg Oral Q6H PRN Tomie China, MD       thiamine (VITAMIN B1) tablet 100 mg  100 mg Oral Daily Lamar Sprinkles, MD   100 mg at 02/23/23 1010   traZODone (DESYREL) tablet 100 mg  100 mg Oral QHS PRN Tomie China, MD   100 mg at 02/23/23 2123   No current outpatient medications on file.    PTA Medications:  Facility Ordered Medications  Medication   [COMPLETED] thiamine (VITAMIN B1) injection 100 mg   [COMPLETED] thiamine (VITAMIN B1) injection 100 mg   thiamine (VITAMIN B1) tablet 100 mg   multivitamin with minerals tablet 1 tablet   [COMPLETED] LORazepam (ATIVAN) tablet 1 mg   Followed by   [COMPLETED] LORazepam (ATIVAN) tablet 1 mg   Followed by   [COMPLETED] LORazepam (ATIVAN) tablet 1 mg   Followed by   [EXPIRED] LORazepam (ATIVAN) tablet 1 mg   melatonin tablet 3 mg   [COMPLETED] hydrOXYzine (ATARAX) tablet 25 mg  loperamide (IMODIUM) capsule 2-4 mg   [COMPLETED] LORazepam (ATIVAN) tablet 1 mg   ondansetron (ZOFRAN-ODT) disintegrating tablet 4 mg   acamprosate (CAMPRAL) tablet 666 mg   nicotine polacrilex (NICORETTE) gum 4 mg   traZODone (DESYREL) tablet 100 mg   [COMPLETED] benzocaine (ORAJEL) 10 % mucosal gel 1 Application       01/31/2023    3:56 AM 01/31/2023    3:00 AM  Depression screen PHQ 2/9  Decreased Interest 1 1  Down, Depressed, Hopeless 1 1  PHQ - 2 Score 2 2  Altered sleeping 1 1  Tired, decreased energy 1 1  Change in appetite 1 1  Feeling bad or failure about  yourself  1 1  Trouble concentrating 1 1  Moving slowly or fidgety/restless 1 1  Suicidal thoughts 1 1  PHQ-9 Score 9 9  Difficult doing work/chores Somewhat difficult Somewhat difficult    Flowsheet Row ED from 02/18/2023 in [facility] Most recent reading at 02/18/2023 12:04 PM ED from 02/18/2023 in [facility] Most recent reading at 02/18/2023  2:17 AM ED from 02/17/2023 in [facility] Most recent reading at 02/17/2023  5:46 PM  C-SSRS RISK CATEGORY No Risk No Risk No Risk      Musculoskeletal  Strength & Muscle Tone: within normal limits Gait & Station: normal Patient leans: N/A  Psychiatric Specialty Exam  Presentation General Appearance: Appropriate for Environment  Eye Contact:Fair  Speech:Clear and Coherent  Speech Volume:Normal  Handedness:-- (not assessed)   Mood and Affect  Mood:Euthymic  Affect:Congruent   Thought Process  Thought Processes:Coherent; Linear  Descriptions of Associations:Intact  Orientation:Full (Time, Place and Person)  Thought Content:Logical    Hallucinations:Hallucinations: None  Ideas of Reference:None  Suicidal Thoughts:Suicidal Thoughts: No  Homicidal Thoughts:Homicidal Thoughts: No   Sensorium  Memory:Immediate Fair; Recent Fair; Remote Fair  Judgment:Fair  Insight:Fair   Executive Functions  Concentration:Fair  Attention Span:Fair  Recall:Fair  Fund of Knowledge:Fair  Language:Fair   Psychomotor Activity  Psychomotor Activity:Psychomotor Activity: Normal   Assets  Assets:Communication Skills; Resilience   Sleep  Sleep:Sleep: Fair    Physical Exam Constitutional:      Appearance: the patient is not toxic-appearing.  Pulmonary:     Effort: Pulmonary effort is normal.  Neurological:     General: No focal deficit present.     Mental Status: the patient is alert and oriented to person, place, and time.    Review of Systems  Respiratory:  Negative for shortness of breath.   Cardiovascular:  Negative for chest pain.  Gastrointestinal:  Negative for abdominal pain, constipation, diarrhea, nausea and vomiting.  Neurological:  Negative for headaches.    BP (!) 145/103   Pulse 65   Temp 98.7 F (37.1 C) (Oral)   Resp 17   SpO2 100%   Demographic Factors:  None pertinent   Loss Factors: NA   Historical Factors: NA   Risk Reduction Factors:   Positive social support, Positive therapeutic relationship, and Positive coping skills or problem solving skills   Continued Clinical Symptoms:  Substance use    Cognitive Features That Contribute To Risk:  None     Suicide Risk:  Mild   Plan Of Care/Follow-up recommendations:  Activity as tolerated. Diet as recommended by PCP. Keep all scheduled follow-up appointments as recommended.   Patient is instructed to take all prescribed medications as recommended. Report any side effects or adverse reactions to your outpatient psychiatrist. Patient is  instructed to abstain from alcohol and illegal drugs while on prescription medications. In the event of worsening symptoms, patient is instructed to call the crisis hotline, 911, or go to the nearest emergency department for evaluation and treatment.     Patient is also instructed prior to discharge to: Take all medications as prescribed by mental healthcare provider. Report any adverse effects and or reactions from the medicines to outpatient provider promptly. Patient has been instructed & cautioned: To not engage in alcohol and or illegal drug use while on prescription medicines. In the event of worsening symptoms,  patient is instructed to call the crisis hotline, 911 and or go to the nearest ED for appropriate evaluation and treatment of symptoms. To follow-up with primary care provider for other medical issues, concerns and or health care needs    Disposition: self-care

## 2023-06-10 ENCOUNTER — Emergency Department (HOSPITAL_COMMUNITY)
Admission: EM | Admit: 2023-06-10 | Discharge: 2023-06-10 | Disposition: A | Discharge status: Home / Self Care | Payer: Self-pay | Attending: Emergency Medicine | Admitting: Emergency Medicine

## 2023-06-10 ENCOUNTER — Encounter (HOSPITAL_COMMUNITY): Payer: Self-pay

## 2023-06-10 ENCOUNTER — Other Ambulatory Visit: Payer: Self-pay

## 2023-06-10 DIAGNOSIS — R319 Hematuria, unspecified: Secondary | ICD-10-CM | POA: Insufficient documentation

## 2023-06-10 LAB — CBC WITH DIFFERENTIAL/PLATELET
Abs Immature Granulocytes: 0.02 10*3/uL (ref 0.00–0.07)
Basophils Absolute: 0 10*3/uL (ref 0.0–0.1)
Basophils Relative: 0 %
Eosinophils Absolute: 0 10*3/uL (ref 0.0–0.5)
Eosinophils Relative: 0 %
HCT: 44.6 % (ref 39.0–52.0)
Hemoglobin: 14.3 g/dL (ref 13.0–17.0)
Immature Granulocytes: 0 %
Lymphocytes Relative: 24 %
Lymphs Abs: 2.2 10*3/uL (ref 0.7–4.0)
MCH: 26.1 pg (ref 26.0–34.0)
MCHC: 32.1 g/dL (ref 30.0–36.0)
MCV: 81.4 fL (ref 80.0–100.0)
Monocytes Absolute: 0.6 10*3/uL (ref 0.1–1.0)
Monocytes Relative: 7 %
Neutro Abs: 6.2 10*3/uL (ref 1.7–7.7)
Neutrophils Relative %: 69 %
Platelets: 368 10*3/uL (ref 150–400)
RBC: 5.48 MIL/uL (ref 4.22–5.81)
RDW: 14.9 % (ref 11.5–15.5)
WBC: 9 10*3/uL (ref 4.0–10.5)
nRBC: 0 % (ref 0.0–0.2)

## 2023-06-10 LAB — RAPID URINE DRUG SCREEN, HOSP PERFORMED
Amphetamines: POSITIVE — AB
Barbiturates: NOT DETECTED
Benzodiazepines: NOT DETECTED
Cocaine: POSITIVE — AB
Opiates: NOT DETECTED
Tetrahydrocannabinol: NOT DETECTED

## 2023-06-10 LAB — COMPREHENSIVE METABOLIC PANEL
ALT: 40 U/L (ref 0–44)
AST: 29 U/L (ref 15–41)
Albumin: 3.6 g/dL (ref 3.5–5.0)
Alkaline Phosphatase: 39 U/L (ref 38–126)
Anion gap: 12 (ref 5–15)
BUN: <5 mg/dL — ABNORMAL LOW (ref 6–20)
CO2: 19 mmol/L — ABNORMAL LOW (ref 22–32)
Calcium: 9 mg/dL (ref 8.9–10.3)
Chloride: 105 mmol/L (ref 98–111)
Creatinine, Ser: 0.79 mg/dL (ref 0.61–1.24)
GFR, Estimated: >60 mL/min (ref >60)
Glucose, Bld: 117 mg/dL — ABNORMAL HIGH (ref 70–99)
Potassium: 3.3 mmol/L — ABNORMAL LOW (ref 3.5–5.1)
Sodium: 136 mmol/L (ref 135–145)
Total Bilirubin: 0.4 mg/dL (ref <1.2)
Total Protein: 6.9 g/dL (ref 6.5–8.1)

## 2023-06-10 LAB — URINALYSIS, ROUTINE W REFLEX MICROSCOPIC
Bacteria, UA: NONE SEEN
Bilirubin Urine: NEGATIVE
Glucose, UA: NEGATIVE mg/dL
Ketones, ur: 5 mg/dL — AB
Leukocytes,Ua: NEGATIVE
Nitrite: NEGATIVE
Protein, ur: NEGATIVE mg/dL
Specific Gravity, Urine: 1.002 — ABNORMAL LOW (ref 1.005–1.030)
pH: 6 (ref 5.0–8.0)

## 2023-06-10 LAB — ETHANOL: Alcohol, Ethyl (B): 26 mg/dL — ABNORMAL HIGH (ref <10)

## 2023-06-10 LAB — HIV ANTIBODY (ROUTINE TESTING W REFLEX): HIV Screen 4th Generation wRfx: NONREACTIVE

## 2023-06-10 LAB — CK: Total CK: 397 U/L (ref 49–397)

## 2023-06-10 LAB — LIPASE, BLOOD: Lipase: 24 U/L (ref 11–51)

## 2023-06-10 LAB — RPR: RPR Ser Ql: NONREACTIVE

## 2023-06-10 MED ORDER — LORAZEPAM 2 MG/ML IJ SOLN
1.0000 mg | Freq: Once | INTRAMUSCULAR | Status: AC
  Administered 2023-06-10: 1 mg via INTRAVENOUS
  Filled 2023-06-10: qty 1

## 2023-06-10 MED ORDER — SODIUM CHLORIDE 0.9 % IV BOLUS
1000.0000 mL | Freq: Once | INTRAVENOUS | Status: AC
  Administered 2023-06-10: 1000 mL via INTRAVENOUS

## 2023-06-10 NOTE — ED Triage Notes (Signed)
Pt bib ems home c/o blood in urine. Pt states he used cocaine and ecstasy at 0400. Pt noticed blood in urine after having sex. Pt states no main while urinating.   BP 134/86 HR 103 CBG 139 RA 100%

## 2023-06-10 NOTE — Discharge Instructions (Signed)
You were seen in the emergency department today with blood in the urine.  As we discussed, you will need to have repeat testing for sexually transmitted infections in the next 2 to 3 weeks.  You can do this through the health department or your primary care doctor.  I have also listed the name for a urologist.  If you have return of blood in the urine you should make an appointment with that group for further evaluation.

## 2023-06-10 NOTE — ED Provider Notes (Signed)
Emergency Department Provider Note   I have reviewed the triage vital signs and the nursing notes.   HISTORY  Chief Complaint No chief complaint on file.   HPI [name] is a 32 y.o. male with PMH of polysubstance abuse presents to the ED with acute onset hematuria after sex this evening. Denies any pain with urination. No urinary retention. No fever. No abdominal or flank pain. Patient was using cocaine and ecstasy early this AM. Denies heavy drug use in the last several days.      Past Medical History:  Diagnosis Date   MI (myocardial infarction) (HCC)    Reported by patient    Review of Systems  Constitutional: No fever/chills Cardiovascular: Denies chest pain. Respiratory: Denies shortness of breath. Gastrointestinal: No abdominal pain.  No nausea, no vomiting.   Genitourinary: Negative for dysuria. Positive hematuria.  Musculoskeletal: Negative for back pain. Skin: Negative for rash. Neurological: Negative for headaches.   ____________________________________________   PHYSICAL EXAM:  VITAL SIGNS: ED Triage Vitals  Encounter Vitals Group     BP 06/10/23 0912 (!) 155/85     Resp 06/10/23 0912 (!) 26     Temp 06/10/23 0912 98.4 F (36.9 C)     Temp Source 06/10/23 0912 Oral     SpO2 06/10/23 0912 100 %     Weight 06/10/23 0913 300 lb (136.1 kg)     Height 06/10/23 0913 6\' 5"  (1.956 m)   Constitutional: Alert and oriented. Patient is slightly tremulous and tearful at times.  Eyes: Conjunctivae are normal.  Head: Atraumatic. Nose: No congestion/rhinnorhea. Mouth/Throat: Mucous membranes are moist.   Neck: No stridor.   Cardiovascular: Normal rate, regular rhythm. Good peripheral circulation. Grossly normal heart sounds.   Respiratory: Normal respiratory effort.  No retractions. Lungs CTAB. Gastrointestinal: Soft and nontender. No distention.  Musculoskeletal: No gross deformities of extremities. Neurologic:  Normal speech and language.   Skin:  Skin is warm, dry and intact. No rash noted.   ____________________________________________   LABS (all labs ordered are listed, but only abnormal results are displayed)  Labs Reviewed  COMPREHENSIVE METABOLIC PANEL  LIPASE, BLOOD  CBC WITH DIFFERENTIAL/PLATELET  CK  URINALYSIS, ROUTINE W REFLEX MICROSCOPIC  RAPID URINE DRUG SCREEN, HOSP PERFORMED  ETHANOL   ____________________________________________  EKG   EKG Interpretation Date/Time:  Sunday June 10 2023 09:18:16 EST Ventricular Rate:  108 PR Interval:  165 QRS Duration:  87 QT Interval:  339 QTC Calculation: 455 R Axis:   80  Text Interpretation: Sinus tachycardia Confirmed by Alona Bene [phone]) on 06/10/2023 9:44:36 AM       ____________________________________________  RADIOLOGY  No results found.  ____________________________________________   PROCEDURES  Procedure(s) performed:   Procedures   ____________________________________________   INITIAL IMPRESSION / ASSESSMENT AND PLAN / ED COURSE  Pertinent labs & imaging results that were available during my care of the patient were reviewed by me and considered in my medical decision making (see chart for details).   This patient is Presenting for Evaluation of hematuria, which does require a range of treatment options, and is a complaint that involves a high risk of morbidity and mortality.  The Differential Diagnoses include gross hematuria, rhabdomyolysis, ureteral stone, etc.  Critical Interventions-    Medications  sodium chloride 0.9 % bolus 1,000 mL (has no administration in time range)  LORazepam (ATIVAN) injection 1 mg (has no administration in time range)    Reassessment after intervention:    I decided to review  pertinent External Data, and in summary patient with history of EtOH abuse and multiple prior ED visits in the past.   Clinical Laboratory Tests Ordered, included ***  Radiologic Tests Ordered, included  ***. I independently interpreted the images and agree with radiology interpretation.   Cardiac Monitor Tracing which shows ***   Social Determinants of Health Risk patient with history of polysubstance abuse.   Consult complete with  Medical Decision Making: Summary:  Presents emergency department with hematuria after having sex.  Symptoms are not present upon arrival to the ED.  He has also been using cocaine and ecstasy this evening.  No urinary retention symptoms.  Plan for screening lab work including CK, IV fluids, reassess.  No abdominal or flank pain to prompt imaging, although considered.   Reevaluation with update and discussion with   ***Considered admission***  Patient's presentation is most consistent with acute presentation with potential threat to life or bodily function.   Disposition:   ____________________________________________  FINAL CLINICAL IMPRESSION(S) / ED DIAGNOSES  Final diagnoses:  None     NEW OUTPATIENT MEDICATIONS STARTED DURING THIS VISIT:  New Prescriptions   No medications on file    Note:  This document was prepared using Dragon voice recognition software and may include unintentional dictation errors.  Alona Bene, MD, [facility]

## 2023-08-07 ENCOUNTER — Emergency Department (HOSPITAL_BASED_OUTPATIENT_CLINIC_OR_DEPARTMENT_OTHER): Payer: Self-pay

## 2023-08-07 ENCOUNTER — Other Ambulatory Visit: Payer: Self-pay

## 2023-08-07 ENCOUNTER — Encounter (HOSPITAL_BASED_OUTPATIENT_CLINIC_OR_DEPARTMENT_OTHER): Payer: Self-pay | Admitting: Urology

## 2023-08-07 ENCOUNTER — Emergency Department (HOSPITAL_BASED_OUTPATIENT_CLINIC_OR_DEPARTMENT_OTHER)
Admission: EM | Admit: 2023-08-07 | Discharge: 2023-08-07 | Disposition: A | Discharge status: Home / Self Care | Payer: Self-pay | Attending: Emergency Medicine | Admitting: Emergency Medicine

## 2023-08-07 DIAGNOSIS — M79645 Pain in left finger(s): Secondary | ICD-10-CM | POA: Insufficient documentation

## 2023-08-07 DIAGNOSIS — W108XXA Fall (on) (from) other stairs and steps, initial encounter: Secondary | ICD-10-CM | POA: Insufficient documentation

## 2023-08-07 DIAGNOSIS — S0083XA Contusion of other part of head, initial encounter: Secondary | ICD-10-CM | POA: Insufficient documentation

## 2023-08-07 DIAGNOSIS — W19XXXA Unspecified fall, initial encounter: Secondary | ICD-10-CM

## 2023-08-07 MED ORDER — MUPIROCIN 2 % EX OINT
TOPICAL_OINTMENT | Freq: Once | CUTANEOUS | Status: AC
  Administered 2023-08-07: 1 via TOPICAL

## 2023-08-07 MED ORDER — NAPROXEN 250 MG PO TABS
500.0000 mg | ORAL_TABLET | Freq: Once | ORAL | Status: AC
  Administered 2023-08-07: 500 mg via ORAL
  Filled 2023-08-07: qty 2

## 2023-08-07 MED ORDER — NAPROXEN 500 MG PO TABS: 500.0000 mg | ORAL_TABLET | Freq: Two times a day (BID) | ORAL | 0 refills | Status: AC

## 2023-08-07 NOTE — ED Notes (Signed)

## 2023-08-07 NOTE — ED Triage Notes (Signed)
 Pt states mechanical fall on steps on Sunday night  States nose injury and left pinky finger injury  Abrasion to nose noted  No bleeding at this time   Denies hitting head or LOC at time of fall

## 2023-08-07 NOTE — Discharge Instructions (Addendum)
 As discussed, you did not fracture your finger.  Apply mupirocin  once a day after after cleaning your wound with soap and water.  Stop using hydrogen peroxide or alcohol to clean the wound.  Take naproxen  twice a day as needed for pain.  Get help right away if: You have very bad pain or a headache, and medicine does not help. You are very tired or confused. Your personality changes. You vomit. You have a nosebleed that does not stop. You see two of everything (double vision) or have blurry vision. You have clear fluid coming from your nose or ear, and it does not go away. You have problems walking or using your arms or legs. You feel very dizzy.

## 2023-08-07 NOTE — ED Provider Notes (Signed)
 [place] [facility] AT [facility] Provider Note   CSN: [id number] Arrival date & time: 08/07/23  1803     History  Chief Complaint  Patient presents with   [name] JONETTA Karel is a 33 y.o. male with a history of myocardial infarction and alcohol use disorder presents the ED today after a fall.  Patient reports that he fell going up metal stairs 2 days ago and injured his nose and left pinky finger.  He has a cut to the nose and finger as well. Denies loss of consciousness, vision changes, weakness, or slurred speech.  He reports that he has been using hydrogen peroxide daily to clean the wounds but he has not been take anything for the pain.  Denies any additional injuries.  No additional complaints or concerns at this time.    Home Medications Prior to Admission medications   Medication Sig Start Date End Date Taking? Authorizing Provider  naproxen  (NAPROSYN ) 500 MG tablet Take 1 tablet (500 mg total) by mouth 2 (two) times daily for 7 days. 08/07/23 08/14/23 Yes Waddell Sluder, PA-C  acamprosate  (CAMPRAL ) 333 MG tablet Take 2 tablets (666 mg total) by mouth 3 (three) times daily. 02/24/23   Marry Clamp, MD  nicotine  polacrilex (NICORETTE ) 4 MG gum Take 1 each (4 mg total) by mouth every 4 (four) hours while awake. 02/24/23   Marry Clamp, MD      Allergies    Patient has no known allergies.    Review of Systems   Review of Systems  Skin:  Positive for wound.  All other systems reviewed and are negative.   Physical Exam Updated Vital Signs BP (!) 140/100 (BP Location: Left Arm)   Pulse 98   Temp 98.1 F (36.7 C)   Resp 18   Ht 6' 5 (1.956 m)   Wt (!) 163.3 kg   SpO2 98%   BMI 42.69 kg/m  Physical Exam Vitals and nursing note reviewed.  Constitutional:      General: He is not in acute distress.    Appearance: Normal appearance.  HENT:     Head: Normocephalic.     Comments: 1 cm vertical healing wound to the nose    Mouth/Throat:      Mouth: Mucous membranes are moist.  Eyes:     Conjunctiva/sclera: Conjunctivae normal.     Pupils: Pupils are equal, round, and reactive to light.  Cardiovascular:     Rate and Rhythm: Normal rate and regular rhythm.     Pulses: Normal pulses.     Heart sounds: Normal heart sounds.  Pulmonary:     Effort: Pulmonary effort is normal.     Breath sounds: Normal breath sounds.  Abdominal:     Palpations: Abdomen is soft.     Tenderness: There is no abdominal tenderness.  Musculoskeletal:        General: Tenderness present. Normal range of motion.     Comments: Tenderness to palpation of the medial aspect of the DIP joint of the left little finger.  Range of motion, sensation, and strength intact of upper and lower extremities bilaterally.  No midline tenderness to palpation of the cervical, thoracic, lumbar spine.  Skin:    General: Skin is warm and dry.     Findings: No rash.  Neurological:     General: No focal deficit present.     Mental Status: He is alert.     Sensory: No sensory deficit.  Motor: No weakness.  Psychiatric:        Mood and Affect: Mood normal.        Behavior: Behavior normal.    ED Results / Procedures / Treatments   Labs (all labs ordered are listed, but only abnormal results are displayed) Labs Reviewed - No data to display  EKG None  Radiology DG Finger Little Left Result Date: 08/07/2023 CLINICAL DATA:  Fall, injuring the small finger EXAM: LEFT FINGER(S) - 2+ VIEW COMPARISON:  None Available. FINDINGS: Three views of the small finger demonstrate no fracture or acute bony findings. IMPRESSION: 1. Negative. Electronically Signed   By: Ryan Salvage M.D.   On: 08/07/2023 19:06    Procedures Procedures: not indicated.   Medications Ordered in ED Medications  naproxen  (NAPROSYN ) tablet 500 mg (500 mg Oral Given 08/07/23 1919)  mupirocin  ointment (BACTROBAN ) 2 % (1 Application Topical Given 08/07/23 1920)    ED Course/ Medical Decision  Making/ A&P                                 Medical Decision Making Amount and/or Complexity of Data Reviewed Radiology: ordered.  Risk Prescription drug management.   This patient presents to the ED for concern of fall, this involves an extensive number of treatment options, and is a complaint that carries with it a high risk of complications and morbidity.   Differential diagnosis includes: Fracture, dislocation, abrasion, laceration, contusion, etc.   Comorbidities  See HPI above   Additional History  Additional history obtained from prior records.   Imaging Studies  I ordered imaging studies including left little finger x-ray.  I independently visualized and interpreted imaging which showed: No fracture or acute bony findings. I agree with the radiologist interpretation   Problem List / ED Course / Critical Interventions / Medication Management  Clemens forward on metal stairs 2 days ago.  Has an abrasion to the nose and at the DIP joint of the left pinky finger.  Denies loss of consciousness, vision changes, weakness, or slurred speech.  Low suspicion for ICH.  Imaging not indicated per [facility] CT criteria. I ordered medications including: Naproxen  for pain Mupirocin  cream for abrasion  Medications given prior to discharge. I have reviewed the patients home medicines and have made adjustments as needed.   Social Determinants of Health  Physical activity   Test / Admission - Considered  Discussed with patient.  All questions were answered. He is stable and safe for discharge home. Return precautions given.       Final Clinical Impression(s) / ED Diagnoses Final diagnoses:  Fall, initial encounter  Contusion of face, initial encounter    Rx / DC Orders ED Discharge Orders          Ordered    naproxen  (NAPROSYN ) 500 MG tablet  2 times daily        08/07/23 1920              Waddell Sluder, PA-C 08/07/23 1921    Ruthe Cornet,  DO 08/07/23 1923

## 2023-09-02 ENCOUNTER — Encounter (HOSPITAL_COMMUNITY): Payer: Self-pay

## 2023-09-02 ENCOUNTER — Other Ambulatory Visit: Payer: Self-pay

## 2023-09-02 ENCOUNTER — Emergency Department (HOSPITAL_COMMUNITY)
Admission: EM | Admit: 2023-09-02 | Discharge: 2023-09-02 | Disposition: A | Discharge status: Home / Self Care | Payer: Self-pay | Attending: Emergency Medicine | Admitting: Emergency Medicine

## 2023-09-02 ENCOUNTER — Emergency Department (HOSPITAL_COMMUNITY): Payer: Self-pay

## 2023-09-02 DIAGNOSIS — S93492A Sprain of other ligament of left ankle, initial encounter: Secondary | ICD-10-CM

## 2023-09-02 DIAGNOSIS — S93432A Sprain of tibiofibular ligament of left ankle, initial encounter: Secondary | ICD-10-CM | POA: Insufficient documentation

## 2023-09-02 DIAGNOSIS — D169 Benign neoplasm of bone and articular cartilage, unspecified: Secondary | ICD-10-CM

## 2023-09-02 DIAGNOSIS — D1632 Benign neoplasm of short bones of left lower limb: Secondary | ICD-10-CM | POA: Insufficient documentation

## 2023-09-02 DIAGNOSIS — W19XXXA Unspecified fall, initial encounter: Secondary | ICD-10-CM | POA: Insufficient documentation

## 2023-09-02 MED ORDER — OXYCODONE-ACETAMINOPHEN 5-325 MG PO TABS
1.0000 | ORAL_TABLET | Freq: Once | ORAL | Status: AC
  Administered 2023-09-02: 1 via ORAL
  Filled 2023-09-02 (×2): qty 1

## 2023-09-02 NOTE — ED Triage Notes (Signed)
Pt arrived from home via POV s/p mechanical fall 2 days ago with  Left ankle pain 10/10 and swelling. Pt noted to have limited ROM to left ankle. Pt states that he is not able to put pressure on left ankle to walk

## 2023-09-02 NOTE — ED Provider Notes (Signed)
[place] EMERGENCY DEPARTMENT AT [facility] Provider Note   CSN: [id number] Arrival date & time: 09/02/23  1903     History  Chief Complaint  Patient presents with   Ankle Pain    [name] is a 33 y.o. male with overall noncontributory past medical history presents concern for mechanical fall 2 days ago with rolling of left ankle.  Endorses significant pain, swelling, difficulty walking on the affected leg.  He reports he sprained to the same leg at some point in the past.  He denies any numbness, loss of sensation of the affected leg.  He has been keeping it elevated, icing at with no significant relief of his symptoms.   Ankle Pain      Home Medications Prior to Admission medications   Medication Sig Start Date End Date Taking? Authorizing Provider  acamprosate (CAMPRAL) 333 MG tablet Take 2 tablets (666 mg total) by mouth 3 (three) times daily. 02/24/23   Carlyn Reichert, MD  nicotine polacrilex (NICORETTE) 4 MG gum Take 1 each (4 mg total) by mouth every 4 (four) hours while awake. 02/24/23   Carlyn Reichert, MD      Allergies    Patient has no known allergies.    Review of Systems   Review of Systems  All other systems reviewed and are negative.   Physical Exam Updated Vital Signs BP 129/81 (BP Location: Right Arm)   Pulse 88   Temp 98.4 F (36.9 C) (Oral)   Resp 14   Ht 6\' 3"  (1.905 m)   Wt (!) 158.8 kg   SpO2 97%   BMI 43.75 kg/m  Physical Exam Vitals and nursing note reviewed.  Constitutional:      General: He is not in acute distress.    Appearance: Normal appearance.  HENT:     Head: Normocephalic and atraumatic.  Eyes:     General:        Right eye: No discharge.        Left eye: No discharge.  Cardiovascular:     Rate and Rhythm: Normal rate and regular rhythm.     Pulses: Normal pulses.  Pulmonary:     Effort: Pulmonary effort is normal. No respiratory distress.  Musculoskeletal:        General: No deformity.      Comments: Patient with tenderness palpation ATFL distribution of left ankle, some soft tissue swelling noted.  Additionally just above the left lateral malleolus there is a bony prominence that is palpated.  No step-off noted.  Intact strength of plantarflexion, dorsiflexion with some difficulty secondary to pain.  Skin:    General: Skin is warm and dry.     Capillary Refill: Capillary refill takes less than 2 seconds.  Neurological:     Mental Status: He is alert and oriented to person, place, and time.  Psychiatric:        Mood and Affect: Mood normal.        Behavior: Behavior normal.     ED Results / Procedures / Treatments   Labs (all labs ordered are listed, but only abnormal results are displayed) Labs Reviewed - No data to display  EKG None  Radiology DG Ankle Complete Left Result Date: 09/02/2023 CLINICAL DATA:  Fall, left ankle pain EXAM: LEFT ANKLE COMPLETE - 3+ VIEW COMPARISON:  None Available. FINDINGS: Normal alignment. No acute fracture or dislocation ankle mortise is aligned. Ossified excrescence arises from the plantar aspect of the mid body of the  calcaneus of may represent an osteochondroma less likely the sequela remote trauma or inflammation. No ankle effusion. Soft tissues are. IMPRESSION: 1. No acute fracture or dislocation. 2. Ossified excrescence arising from the plantar aspect of the mid body of the calcaneus, possibly an osteochondroma. This could be confirmed with MRI examination. Electronically Signed   By: Helyn Numbers M.D.   On: 09/02/2023 20:27   DG Foot Complete Left Result Date: 09/02/2023 CLINICAL DATA:  Fall, left foot pain EXAM: LEFT FOOT - COMPLETE 3+ VIEW COMPARISON:  None Available. FINDINGS: Normal alignment. No acute fracture or dislocation. Joint spaces are preserved. Platelike ossified densities adjacent to the mid body of the calcaneus appears confluent with the calcaneus itself may reflect a osteochondroma or sequela prior trauma or  inflammation. Mild subcutaneous edema involving the dorsal and medial soft tissues. IMPRESSION: 1. Mild subcutaneous edema. No acute fracture or dislocation. Electronically Signed   By: Helyn Numbers M.D.   On: 09/02/2023 20:00    Procedures Procedures    Medications Ordered in ED Medications  oxyCODONE-acetaminophen (PERCOCET/ROXICET) 5-325 MG per tablet 1 tablet (1 tablet Oral Given 09/02/23 2113)    ED Course/ Medical Decision Making/ A&P                                 Medical Decision Making Amount and/or Complexity of Data Reviewed Radiology: ordered.  Risk Prescription drug management.   This patient is a 33 y.o. male who presents to the ED for concern of ankle pain.   Differential diagnoses prior to evaluation: Fracture, dislocation, sprain, strain, other soft tissue injury, vs other  Past Medical History / Social History / Additional history: Chart reviewed. Pertinent results include: Alcohol use, stimulant use, otherwise noncontributory  Physical Exam: Physical exam performed. The pertinent findings include: Patient with tenderness palpation ATFL distribution of left ankle, some soft tissue swelling noted.  Additionally just above the left lateral malleolus there is a bony prominence that is palpated.  No step-off noted.  Intact strength of plantarflexion, dorsiflexion with some difficulty secondary to pain.   Medications / Treatment: I independently interpreted plain film radiograph of the left ankle, left foot, patient with findings consistent with osteochondroma, no evidence of acute fracture, dislocation.   Disposition: After consideration of the diagnostic results and the patients response to treatment, I feel that findings consistent with ankle sprain and ATFL distribution, osteochondroma.  Lace up ankle brace provided, crutches provided for comfort, encourage orthopedic follow-up for both osteochondroma and ankle sprain.  Encouraged RICE, Tylenol, ibuprofen,  weightbearing as tolerated.   emergency department workup does not suggest an emergent condition requiring admission or immediate intervention beyond what has been performed at this time. The plan is: as above. The patient is safe for discharge and has been instructed to return immediately for worsening symptoms, change in symptoms or any other concerns.  Final Clinical Impression(s) / ED Diagnoses Final diagnoses:  Sprain of anterior talofibular ligament of left ankle, initial encounter  Osteochondroma    Rx / DC Orders ED Discharge Orders     None         [place] 09/02/23 2145    Gwyneth Sprout, MD 09/02/23 2353

## 2023-09-02 NOTE — Discharge Instructions (Signed)
Please use Tylenol or ibuprofen for pain.  You may use 600 mg ibuprofen every 6 hours or 1000 mg of Tylenol every 6 hours.  You may choose to alternate between the 2.  This would be most effective.  Not to exceed 4 g of Tylenol within 24 hours.  Not to exceed 3200 mg ibuprofen 24 hours.  As we discussed I recommend rest, ice, compression, elevation, you can use the crutches for comfort for the first few days, however this is a weightbearing as tolerated injury so as soon as your pain is slightly improved I do recommend that you attempt to walk on the affected foot.  You can wear the ankle brace for comfort, support.  Please follow-up with orthopedic physician for recheck of the ankle and further evaluation of that bony growth that we discussed called an osteochondroma.  As we discussed this is a commonly seen almost always benign bony growth, but they may want to reimage it at some point in the future to ensure that it is not growing or changing.

## 2023-09-06 ENCOUNTER — Encounter (HOSPITAL_BASED_OUTPATIENT_CLINIC_OR_DEPARTMENT_OTHER): Payer: Self-pay | Admitting: Emergency Medicine

## 2023-09-06 ENCOUNTER — Emergency Department (HOSPITAL_BASED_OUTPATIENT_CLINIC_OR_DEPARTMENT_OTHER)
Admission: EM | Admit: 2023-09-06 | Discharge: 2023-09-06 | Disposition: A | Discharge status: Home / Self Care | Payer: Self-pay | Attending: Emergency Medicine | Admitting: Emergency Medicine

## 2023-09-06 DIAGNOSIS — X501XXD Overexertion from prolonged static or awkward postures, subsequent encounter: Secondary | ICD-10-CM | POA: Insufficient documentation

## 2023-09-06 DIAGNOSIS — S93402D Sprain of unspecified ligament of left ankle, subsequent encounter: Secondary | ICD-10-CM | POA: Insufficient documentation

## 2023-09-06 MED ORDER — IBUPROFEN 400 MG PO TABS
600.0000 mg | ORAL_TABLET | Freq: Once | ORAL | Status: AC
  Administered 2023-09-06: 600 mg via ORAL
  Filled 2023-09-06: qty 1

## 2023-09-06 MED ORDER — OXYCODONE HCL 5 MG PO TABS: 5.0000 mg | ORAL_TABLET | ORAL | 0 refills | Status: AC | PRN

## 2023-09-06 NOTE — ED Triage Notes (Signed)
"  Rolled " ankle on Saturday.  Seen at [facility] on 09/02/2023 and given brace and OTC pain med. Continued pain

## 2023-09-06 NOTE — ED Provider Triage Note (Signed)
 Emergency Medicine Provider Triage Evaluation Note  STEFFAN CANIGLIA , a 33 y.o. male  was evaluated in triage.  Pt complains of ongoing pain. Previously evaluated. Looking for walking boot and pain control.  Review of Systems  Positive: As above Negative: As above  Physical Exam  BP 118/76 (BP Location: Left Arm)   Pulse 93   Temp 98.4 F (36.9 C)   Resp 17   SpO2 98%  Gen:   Awake, no distress   Resp:  Normal effort  MSK:   Moves extremities without difficulty  Other:    Medical Decision Making  Medically screening exam initiated at 2:47 PM.  Appropriate orders placed.  Yancy JONETTA Capri was informed that the remainder of the evaluation will be completed by another provider, this initial triage assessment does not replace that evaluation, and the importance of remaining in the ED until their evaluation is complete.     Hildegard Loge, PA-C 09/06/23 [phone]

## 2023-09-06 NOTE — ED Provider Notes (Signed)
 [place] EMERGENCY DEPARTMENT AT [facility] Provider Note   CSN: [id number] Arrival date & time: 09/06/23  1353     History  Chief Complaint  Patient presents with   Ankle Pain    TOA MIA is a 33 y.o. male.  33 year old male presents today for concern of ongoing pain in the left ankle after being diagnosed with ankle sprain.  He has taken Tylenol  without significant relief.  Not icing it or taking ibuprofen .  Has not called the orthopedist.  No other complaints.  He is bearing weight.  He is requesting a walking boot as opposed to the ankle brace he has on now.  The history is provided by the patient. No language interpreter was used.       Home Medications Prior to Admission medications   Medication Sig Start Date End Date Taking? Authorizing Provider  oxyCODONE  (ROXICODONE ) 5 MG immediate release tablet Take 1 tablet (5 mg total) by mouth every 4 (four) hours as needed for severe pain (pain score 7-10). 09/06/23  Yes Hildegard, Severin Bou, PA-C  acamprosate  (CAMPRAL ) 333 MG tablet Take 2 tablets (666 mg total) by mouth 3 (three) times daily. 02/24/23   Marry Clamp, MD  nicotine  polacrilex (NICORETTE ) 4 MG gum Take 1 each (4 mg total) by mouth every 4 (four) hours while awake. 02/24/23   Marry Clamp, MD      Allergies    Patient has no known allergies.    Review of Systems   Review of Systems  Musculoskeletal:  Positive for arthralgias. Negative for joint swelling.  All other systems reviewed and are negative.   Physical Exam Updated Vital Signs BP 118/76 (BP Location: Left Arm)   Pulse 93   Temp 98.4 F (36.9 C)   Resp 17   SpO2 98%  Physical Exam Vitals and nursing note reviewed.  Constitutional:      General: He is not in acute distress.    Appearance: Normal appearance. He is not ill-appearing.  HENT:     Head: Normocephalic and atraumatic.     Nose: Nose normal.  Eyes:     Conjunctiva/sclera: Conjunctivae normal.  Cardiovascular:      Rate and Rhythm: Normal rate.  Pulmonary:     Effort: Pulmonary effort is normal. No respiratory distress.  Abdominal:     General: There is no distension.     Palpations: Abdomen is soft.     Tenderness: There is no abdominal tenderness.  Musculoskeletal:        General: No deformity. Normal range of motion.     Cervical back: Normal range of motion.     Comments: Mild tenderness over the left ankle.  Good range of motion of the left ankle.  Neurovascularly intact.  Brisk cap refill.  Left tib-fib and left knee without tenderness to palpation.  Skin:    Findings: No rash.  Neurological:     Mental Status: He is alert.     ED Results / Procedures / Treatments   Labs (all labs ordered are listed, but only abnormal results are displayed) Labs Reviewed - No data to display  EKG None  Radiology No results found.  Procedures Procedures    Medications Ordered in ED Medications  ibuprofen  (ADVIL ) tablet 600 mg (600 mg Oral Given 09/06/23 1455)    ED Course/ Medical Decision Making/ A&P  Medical Decision Making Risk Prescription drug management.   33 year old male presents today for ongoing pain after being diagnosed with left ankle sprain.  Exam overall reassuring.  He is requesting a walking boot.  Cam boot provided.  Orthopedic referral given.  Expressed importance of following up specially with the finding of osteochondroma.  He voices understanding.  Discussed taking ibuprofen .  Short course of pain medication given.  Discharged in stable condition.   Final Clinical Impression(s) / ED Diagnoses Final diagnoses:  Sprain of left ankle, unspecified ligament, subsequent encounter    Rx / DC Orders ED Discharge Orders          Ordered    oxyCODONE  (ROXICODONE ) 5 MG immediate release tablet  Every 4 hours PRN        09/06/23 1650              Hildegard Loge, PA-C 09/06/23 1707    Lenor Hollering, MD 09/06/23 2354

## 2023-09-06 NOTE — Discharge Instructions (Addendum)
 You have an ankle sprain.  Predominantly take ibuprofen  600 mg every 6-8 hours.  I have sent in a short course of pain medicine for severe breakthrough pain.  Please call and follow-up with the orthopedist. They will Follow-up on the ankle sprain as well as the osteochondroma that was found on the x-ray.  Any emergent symptoms return to the emergency room.  Bear weight as you can tolerate it.

## 2023-09-06 NOTE — ED Notes (Signed)
 RN reviewed discharge instructions with pt. Pt verbalized understanding and had no further questions. VSS upon discharge.

## 2023-10-05 ENCOUNTER — Encounter (HOSPITAL_BASED_OUTPATIENT_CLINIC_OR_DEPARTMENT_OTHER): Payer: Self-pay | Admitting: Urology

## 2023-10-05 ENCOUNTER — Emergency Department (HOSPITAL_BASED_OUTPATIENT_CLINIC_OR_DEPARTMENT_OTHER)
Admission: EM | Admit: 2023-10-05 | Discharge: 2023-10-06 | Disposition: A | Discharge status: Home / Self Care | Payer: Self-pay | Attending: Emergency Medicine | Admitting: Emergency Medicine

## 2023-10-05 DIAGNOSIS — R197 Diarrhea, unspecified: Secondary | ICD-10-CM | POA: Insufficient documentation

## 2023-10-05 LAB — COMPREHENSIVE METABOLIC PANEL
ALT: 40 U/L (ref 0–44)
AST: 49 U/L — ABNORMAL HIGH (ref 15–41)
Albumin: 3.8 g/dL (ref 3.5–5.0)
Alkaline Phosphatase: 50 U/L (ref 38–126)
Anion gap: 8 (ref 5–15)
BUN: 9 mg/dL (ref 6–20)
CO2: 22 mmol/L (ref 22–32)
Calcium: 8.9 mg/dL (ref 8.9–10.3)
Chloride: 103 mmol/L (ref 98–111)
Creatinine, Ser: 1.03 mg/dL (ref 0.61–1.24)
GFR, Estimated: >60 mL/min (ref >60)
Glucose, Bld: 125 mg/dL — ABNORMAL HIGH (ref 70–99)
Potassium: 4.6 mmol/L (ref 3.5–5.1)
Sodium: 133 mmol/L — ABNORMAL LOW (ref 135–145)
Total Bilirubin: 1 mg/dL (ref 0.0–1.2)
Total Protein: 7.8 g/dL (ref 6.5–8.1)

## 2023-10-05 LAB — CBC WITH DIFFERENTIAL/PLATELET
Abs Immature Granulocytes: 0.01 10*3/uL (ref 0.00–0.07)
Basophils Absolute: 0 10*3/uL (ref 0.0–0.1)
Basophils Relative: 1 %
Eosinophils Absolute: 0 10*3/uL (ref 0.0–0.5)
Eosinophils Relative: 1 %
HCT: 50 % (ref 39.0–52.0)
Hemoglobin: 15.8 g/dL (ref 13.0–17.0)
Immature Granulocytes: 0 %
Lymphocytes Relative: 55 %
Lymphs Abs: 2.4 10*3/uL (ref 0.7–4.0)
MCH: 25.2 pg — ABNORMAL LOW (ref 26.0–34.0)
MCHC: 31.6 g/dL (ref 30.0–36.0)
MCV: 79.9 fL — ABNORMAL LOW (ref 80.0–100.0)
Monocytes Absolute: 0.8 10*3/uL (ref 0.1–1.0)
Monocytes Relative: 18 %
Neutro Abs: 1.1 10*3/uL — ABNORMAL LOW (ref 1.7–7.7)
Neutrophils Relative %: 25 %
Platelets: 307 10*3/uL (ref 150–400)
RBC: 6.26 MIL/uL — ABNORMAL HIGH (ref 4.22–5.81)
RDW: 15.1 % (ref 11.5–15.5)
WBC: 4.4 10*3/uL (ref 4.0–10.5)
nRBC: 0 % (ref 0.0–0.2)

## 2023-10-05 LAB — LIPASE, BLOOD: Lipase: 33 U/L (ref 11–51)

## 2023-10-05 MED ORDER — LOPERAMIDE HCL 2 MG PO CAPS
2.0000 mg | ORAL_CAPSULE | Freq: Once | ORAL | Status: AC
  Administered 2023-10-05: 2 mg via ORAL
  Filled 2023-10-05: qty 1

## 2023-10-05 NOTE — Discharge Instructions (Signed)
 Return if any problems.

## 2023-10-05 NOTE — ED Triage Notes (Signed)
 Pt states fever that started last night and now having diarrhea  States concern for stomach bug   Denies pain  Denies N/V

## 2023-10-05 NOTE — ED Provider Notes (Signed)
 [place] [facility] AT [facility] Provider Note   CSN: [id number] Arrival date & time: 10/05/23  1745     History  Chief Complaint  Patient presents with   Diarrhea    [name] is a 33 y.o. male.  Patient complains of diarrhea.  Patient reports he has had diarrhea for the past 2 days.  Patient reports he still has some diarrhea today.  Patient reports that he did have some abdominal discomfort but is feeling better.  Patient reports he has not had any vomiting.  Patient denies any fever or chills.  He denies any exposure to anyone who has been sick.   The history is provided by the patient. No language interpreter was used.  Diarrhea Severity:  Moderate Onset quality:  Gradual Number of episodes:  Multiple Timing:  Constant Progression:  Worsening Relieved by:  Nothing Associated symptoms: no chills, no recent cough and no fever   Risk factors: no sick contacts        Home Medications Prior to Admission medications   Medication Sig Start Date End Date Taking? Authorizing Provider  acamprosate (CAMPRAL) 333 MG tablet Take 2 tablets (666 mg total) by mouth 3 (three) times daily. 02/24/23   Carlyn Reichert, MD  nicotine polacrilex (NICORETTE) 4 MG gum Take 1 each (4 mg total) by mouth every 4 (four) hours while awake. 02/24/23   Carlyn Reichert, MD  oxyCODONE (ROXICODONE) 5 MG immediate release tablet Take 1 tablet (5 mg total) by mouth every 4 (four) hours as needed for severe pain (pain score 7-10). 09/06/23   Marita Kansas, PA-C      Allergies    Patient has no known allergies.    Review of Systems   Review of Systems  Constitutional:  Negative for chills and fever.  Gastrointestinal:  Positive for diarrhea.  All other systems reviewed and are negative.   Physical Exam Updated Vital Signs BP (!) 140/85 (BP Location: Right Arm)   Pulse 83   Temp 98.1 F (36.7 C) (Oral)   Resp 17   Ht 6\' 3"  (1.905 m)   Wt (!) 158 kg   SpO2 100%    BMI 43.54 kg/m  Physical Exam Vitals and nursing note reviewed.  Constitutional:      Appearance: He is well-developed.  HENT:     Head: Normocephalic.     Mouth/Throat:     Mouth: Mucous membranes are moist.  Eyes:     Pupils: Pupils are equal, round, and reactive to light.  Cardiovascular:     Rate and Rhythm: Normal rate.  Pulmonary:     Effort: Pulmonary effort is normal.  Abdominal:     General: Abdomen is flat. There is no distension.  Musculoskeletal:        General: Normal range of motion.     Cervical back: Normal range of motion.  Skin:    General: Skin is warm.  Neurological:     General: No focal deficit present.     Mental Status: He is alert and oriented to person, place, and time.  Psychiatric:        Mood and Affect: Mood normal.     ED Results / Procedures / Treatments   Labs (all labs ordered are listed, but only abnormal results are displayed) Labs Reviewed  CBC WITH DIFFERENTIAL/PLATELET - Abnormal; Notable for the following components:      Result Value   RBC 6.26 (*)    MCV 79.9 (*)  MCH 25.2 (*)    Neutro Abs 1.1 (*)    All other components within normal limits  COMPREHENSIVE METABOLIC PANEL - Abnormal; Notable for the following components:   Sodium 133 (*)    Glucose, Bld 125 (*)    AST 49 (*)    All other components within normal limits  LIPASE, BLOOD    EKG None  Radiology No results found.  Procedures Procedures    Medications Ordered in ED Medications  loperamide (IMODIUM) capsule 2 mg (2 mg Oral Given 10/05/23 2301)    ED Course/ Medical Decision Making/ A&P                                 Medical Decision Making Patient complains of multiple episodes of diarrhea he has not taken any medications.  He reports he has been drinking Gatorade.  Amount and/or Complexity of Data Reviewed Labs: ordered. Decision-making details documented in ED Course.    Details: Labs ordered reviewed and interpreted.  Lipase is normal CBC  is normal chemistries are normal.  Risk Prescription drug management.           Final Clinical Impression(s) / ED Diagnoses Final diagnoses:  Diarrhea, unspecified type    Rx / DC Orders ED Discharge Orders     None      An After Visit Summary was printed and given to the patient.    Elson Areas, [place] 10/05/23 2353    Tegeler, Canary Brim, MD 10/06/23 0010

## 2023-12-08 ENCOUNTER — Emergency Department (HOSPITAL_COMMUNITY): Payer: Self-pay

## 2023-12-08 ENCOUNTER — Encounter (HOSPITAL_COMMUNITY): Payer: Self-pay

## 2023-12-08 ENCOUNTER — Emergency Department (HOSPITAL_COMMUNITY)
Admission: EM | Admit: 2023-12-08 | Discharge: 2023-12-08 | Disposition: A | Discharge status: Home / Self Care | Payer: Self-pay | Attending: Emergency Medicine | Admitting: Emergency Medicine

## 2023-12-08 ENCOUNTER — Other Ambulatory Visit: Payer: Self-pay

## 2023-12-08 DIAGNOSIS — S0083XA Contusion of other part of head, initial encounter: Secondary | ICD-10-CM | POA: Insufficient documentation

## 2023-12-08 DIAGNOSIS — Z23 Encounter for immunization: Secondary | ICD-10-CM | POA: Insufficient documentation

## 2023-12-08 MED ORDER — TETANUS-DIPHTH-ACELL PERTUSSIS 5-2.5-18.5 LF-MCG/0.5 IM SUSY
0.5000 mL | PREFILLED_SYRINGE | Freq: Once | INTRAMUSCULAR | Status: AC
  Administered 2023-12-08: 0.5 mL via INTRAMUSCULAR
  Filled 2023-12-08: qty 0.5

## 2023-12-08 MED ORDER — ACETAMINOPHEN 500 MG PO TABS
1000.0000 mg | ORAL_TABLET | Freq: Once | ORAL | Status: AC
  Administered 2023-12-08: 1000 mg via ORAL
  Filled 2023-12-08: qty 2

## 2023-12-08 MED ORDER — IBUPROFEN 800 MG PO TABS
800.0000 mg | ORAL_TABLET | Freq: Once | ORAL | Status: AC
  Administered 2023-12-08: 800 mg via ORAL
  Filled 2023-12-08: qty 1

## 2023-12-08 MED ORDER — ERYTHROMYCIN 5 MG/GM OP OINT: TOPICAL_OINTMENT | OPHTHALMIC | 0 refills | Status: AC

## 2023-12-08 MED ORDER — NAPROXEN 500 MG PO TABS: 500.0000 mg | ORAL_TABLET | Freq: Two times a day (BID) | ORAL | 0 refills | Status: AC
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
## 2023-12-08 NOTE — ED Notes (Signed)
 Pt reports cervical tenderness. Aspen C-collar applied.
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
